# Patient Record
Sex: Male | Born: 1970 | Race: White | Hispanic: No | Marital: Married | Smoking: Former smoker
Health system: Southern US, Community
[De-identification: ages and names within clinical notes are randomized; demographics above are authoritative.]

## PROBLEM LIST (undated history)

## (undated) DIAGNOSIS — Q759 Congenital malformation of skull and face bones, unspecified: Secondary | ICD-10-CM

## (undated) DIAGNOSIS — F419 Anxiety disorder, unspecified: Secondary | ICD-10-CM

## (undated) DIAGNOSIS — M47816 Spondylosis without myelopathy or radiculopathy, lumbar region: Secondary | ICD-10-CM

## (undated) DIAGNOSIS — M545 Low back pain, unspecified: Secondary | ICD-10-CM

## (undated) DIAGNOSIS — G894 Chronic pain syndrome: Secondary | ICD-10-CM

## (undated) HISTORY — DX: Chronic pain syndrome: G89.4

## (undated) HISTORY — DX: Congenital malformation of skull and face bones, unspecified: Q75.9

## (undated) HISTORY — PX: HERNIA REPAIR: SHX51

## (undated) HISTORY — DX: Low back pain: M54.5

## (undated) HISTORY — PX: SPINE SURGERY: SHX786

## (undated) HISTORY — DX: Low back pain, unspecified: M54.50

## (undated) HISTORY — PX: OTHER SURGICAL HISTORY: SHX169

## (undated) HISTORY — DX: Spondylosis without myelopathy or radiculopathy, lumbar region: M47.816

## (undated) HISTORY — DX: Anxiety disorder, unspecified: F41.9

---

## 2002-10-25 ENCOUNTER — Ambulatory Visit (HOSPITAL_COMMUNITY): Admission: RE | Admit: 2002-10-25 | Discharge: 2002-10-25 | Payer: Self-pay | Admitting: Family Medicine

## 2002-10-25 ENCOUNTER — Encounter: Payer: Self-pay | Admitting: Family Medicine

## 2004-11-19 ENCOUNTER — Encounter
Admission: RE | Admit: 2004-11-19 | Discharge: 2005-02-17 | Payer: Self-pay | Admitting: Physical Medicine & Rehabilitation

## 2004-11-20 ENCOUNTER — Ambulatory Visit: Payer: Self-pay | Admitting: Physical Medicine & Rehabilitation

## 2005-02-12 ENCOUNTER — Ambulatory Visit: Payer: Self-pay | Admitting: Physical Medicine & Rehabilitation

## 2005-03-27 ENCOUNTER — Encounter
Admission: RE | Admit: 2005-03-27 | Discharge: 2005-06-25 | Payer: Self-pay | Admitting: Physical Medicine & Rehabilitation

## 2005-03-27 ENCOUNTER — Ambulatory Visit: Payer: Self-pay | Admitting: Physical Medicine & Rehabilitation

## 2005-05-16 ENCOUNTER — Ambulatory Visit: Payer: Self-pay | Admitting: Physical Medicine & Rehabilitation

## 2005-06-24 ENCOUNTER — Ambulatory Visit: Payer: Self-pay | Admitting: Physical Medicine & Rehabilitation

## 2005-07-22 ENCOUNTER — Encounter
Admission: RE | Admit: 2005-07-22 | Discharge: 2005-10-20 | Payer: Self-pay | Admitting: Physical Medicine & Rehabilitation

## 2005-08-21 ENCOUNTER — Ambulatory Visit: Payer: Self-pay | Admitting: Physical Medicine & Rehabilitation

## 2005-09-25 ENCOUNTER — Ambulatory Visit: Payer: Self-pay | Admitting: Physical Medicine & Rehabilitation

## 2005-10-21 ENCOUNTER — Encounter
Admission: RE | Admit: 2005-10-21 | Discharge: 2006-01-19 | Payer: Self-pay | Admitting: Physical Medicine & Rehabilitation

## 2005-10-21 ENCOUNTER — Ambulatory Visit: Payer: Self-pay | Admitting: Physical Medicine & Rehabilitation

## 2005-11-26 ENCOUNTER — Ambulatory Visit: Payer: Self-pay | Admitting: Physical Medicine & Rehabilitation

## 2006-01-20 ENCOUNTER — Encounter
Admission: RE | Admit: 2006-01-20 | Discharge: 2006-04-20 | Payer: Self-pay | Admitting: Physical Medicine & Rehabilitation

## 2006-01-20 ENCOUNTER — Ambulatory Visit: Payer: Self-pay | Admitting: Physical Medicine & Rehabilitation

## 2006-03-14 ENCOUNTER — Ambulatory Visit: Payer: Self-pay | Admitting: Physical Medicine & Rehabilitation

## 2006-04-15 ENCOUNTER — Ambulatory Visit: Payer: Self-pay | Admitting: Physical Medicine & Rehabilitation

## 2006-06-06 ENCOUNTER — Encounter
Admission: RE | Admit: 2006-06-06 | Discharge: 2006-09-04 | Payer: Self-pay | Admitting: Physical Medicine & Rehabilitation

## 2006-06-06 ENCOUNTER — Ambulatory Visit: Payer: Self-pay | Admitting: Physical Medicine & Rehabilitation

## 2006-06-11 ENCOUNTER — Ambulatory Visit (HOSPITAL_COMMUNITY): Admission: RE | Admit: 2006-06-11 | Discharge: 2006-06-11 | Payer: Self-pay | Admitting: Urology

## 2006-07-28 ENCOUNTER — Ambulatory Visit: Payer: Self-pay | Admitting: Physical Medicine & Rehabilitation

## 2006-09-18 ENCOUNTER — Encounter
Admission: RE | Admit: 2006-09-18 | Discharge: 2006-12-17 | Payer: Self-pay | Admitting: Physical Medicine & Rehabilitation

## 2006-09-18 ENCOUNTER — Ambulatory Visit: Payer: Self-pay | Admitting: Physical Medicine & Rehabilitation

## 2006-10-22 ENCOUNTER — Ambulatory Visit: Payer: Self-pay | Admitting: Physical Medicine & Rehabilitation

## 2006-10-22 ENCOUNTER — Encounter
Admission: RE | Admit: 2006-10-22 | Discharge: 2007-01-20 | Payer: Self-pay | Admitting: Physical Medicine & Rehabilitation

## 2006-11-25 ENCOUNTER — Ambulatory Visit: Payer: Self-pay | Admitting: Physical Medicine & Rehabilitation

## 2007-01-26 ENCOUNTER — Ambulatory Visit: Payer: Self-pay | Admitting: Physical Medicine & Rehabilitation

## 2007-01-26 ENCOUNTER — Encounter
Admission: RE | Admit: 2007-01-26 | Discharge: 2007-04-26 | Payer: Self-pay | Admitting: Physical Medicine & Rehabilitation

## 2007-04-28 ENCOUNTER — Ambulatory Visit: Payer: Self-pay | Admitting: Physical Medicine & Rehabilitation

## 2007-04-28 ENCOUNTER — Encounter
Admission: RE | Admit: 2007-04-28 | Discharge: 2007-07-27 | Payer: Self-pay | Admitting: Physical Medicine & Rehabilitation

## 2007-06-23 ENCOUNTER — Ambulatory Visit: Payer: Self-pay | Admitting: Physical Medicine & Rehabilitation

## 2007-08-13 ENCOUNTER — Ambulatory Visit: Payer: Self-pay | Admitting: Physical Medicine & Rehabilitation

## 2007-08-13 ENCOUNTER — Encounter
Admission: RE | Admit: 2007-08-13 | Discharge: 2007-08-19 | Payer: Self-pay | Admitting: Physical Medicine & Rehabilitation

## 2007-10-14 ENCOUNTER — Ambulatory Visit: Payer: Self-pay | Admitting: Physical Medicine & Rehabilitation

## 2007-10-14 ENCOUNTER — Encounter
Admission: RE | Admit: 2007-10-14 | Discharge: 2008-01-12 | Payer: Self-pay | Admitting: Physical Medicine & Rehabilitation

## 2007-12-10 ENCOUNTER — Ambulatory Visit: Payer: Self-pay | Admitting: Physical Medicine & Rehabilitation

## 2008-01-07 ENCOUNTER — Ambulatory Visit: Payer: Self-pay | Admitting: Physical Medicine & Rehabilitation

## 2008-02-10 ENCOUNTER — Encounter
Admission: RE | Admit: 2008-02-10 | Discharge: 2008-04-04 | Payer: Self-pay | Admitting: Physical Medicine & Rehabilitation

## 2008-02-12 ENCOUNTER — Ambulatory Visit: Payer: Self-pay | Admitting: Physical Medicine & Rehabilitation

## 2008-03-10 ENCOUNTER — Ambulatory Visit: Payer: Self-pay | Admitting: Physical Medicine & Rehabilitation

## 2008-04-04 ENCOUNTER — Ambulatory Visit: Payer: Self-pay | Admitting: Physical Medicine & Rehabilitation

## 2008-05-05 ENCOUNTER — Encounter
Admission: RE | Admit: 2008-05-05 | Discharge: 2008-06-28 | Payer: Self-pay | Admitting: Physical Medicine & Rehabilitation

## 2008-05-06 ENCOUNTER — Ambulatory Visit: Payer: Self-pay | Admitting: Physical Medicine & Rehabilitation

## 2008-05-31 ENCOUNTER — Ambulatory Visit: Payer: Self-pay | Admitting: Physical Medicine & Rehabilitation

## 2008-06-28 ENCOUNTER — Ambulatory Visit: Payer: Self-pay | Admitting: Physical Medicine & Rehabilitation

## 2008-07-22 ENCOUNTER — Encounter
Admission: RE | Admit: 2008-07-22 | Discharge: 2008-10-20 | Payer: Self-pay | Admitting: Physical Medicine & Rehabilitation

## 2008-08-31 ENCOUNTER — Ambulatory Visit: Payer: Self-pay | Admitting: Physical Medicine & Rehabilitation

## 2008-10-11 ENCOUNTER — Ambulatory Visit: Payer: Self-pay | Admitting: Physical Medicine & Rehabilitation

## 2008-11-01 ENCOUNTER — Encounter
Admission: RE | Admit: 2008-11-01 | Discharge: 2009-01-25 | Payer: Self-pay | Admitting: Physical Medicine & Rehabilitation

## 2008-11-09 ENCOUNTER — Ambulatory Visit: Payer: Self-pay | Admitting: Physical Medicine & Rehabilitation

## 2008-12-07 ENCOUNTER — Ambulatory Visit: Payer: Self-pay | Admitting: Physical Medicine & Rehabilitation

## 2009-01-25 ENCOUNTER — Encounter
Admission: RE | Admit: 2009-01-25 | Discharge: 2009-04-25 | Payer: Self-pay | Admitting: Physical Medicine & Rehabilitation

## 2009-02-01 ENCOUNTER — Ambulatory Visit: Payer: Self-pay | Admitting: Physical Medicine & Rehabilitation

## 2009-03-01 ENCOUNTER — Ambulatory Visit: Payer: Self-pay | Admitting: Physical Medicine & Rehabilitation

## 2009-03-24 ENCOUNTER — Ambulatory Visit: Payer: Self-pay | Admitting: Physical Medicine & Rehabilitation

## 2009-05-03 ENCOUNTER — Encounter
Admission: RE | Admit: 2009-05-03 | Discharge: 2009-08-01 | Payer: Self-pay | Admitting: Physical Medicine & Rehabilitation

## 2009-05-10 ENCOUNTER — Ambulatory Visit: Payer: Self-pay | Admitting: Physical Medicine & Rehabilitation

## 2009-06-14 ENCOUNTER — Ambulatory Visit: Payer: Self-pay | Admitting: Physical Medicine & Rehabilitation

## 2009-07-12 ENCOUNTER — Ambulatory Visit: Payer: Self-pay | Admitting: Physical Medicine & Rehabilitation

## 2009-08-03 ENCOUNTER — Encounter
Admission: RE | Admit: 2009-08-03 | Discharge: 2009-10-04 | Payer: Self-pay | Admitting: Physical Medicine & Rehabilitation

## 2009-08-09 ENCOUNTER — Ambulatory Visit: Payer: Self-pay | Admitting: Physical Medicine & Rehabilitation

## 2009-09-06 ENCOUNTER — Ambulatory Visit: Payer: Self-pay | Admitting: Physical Medicine & Rehabilitation

## 2009-10-04 ENCOUNTER — Ambulatory Visit: Payer: Self-pay | Admitting: Physical Medicine & Rehabilitation

## 2009-10-25 ENCOUNTER — Encounter
Admission: RE | Admit: 2009-10-25 | Discharge: 2010-01-23 | Payer: Self-pay | Admitting: Physical Medicine & Rehabilitation

## 2009-11-01 ENCOUNTER — Ambulatory Visit: Payer: Self-pay | Admitting: Physical Medicine & Rehabilitation

## 2009-12-06 ENCOUNTER — Ambulatory Visit: Payer: Self-pay | Admitting: Physical Medicine & Rehabilitation

## 2010-01-04 ENCOUNTER — Ambulatory Visit: Payer: Self-pay | Admitting: Physical Medicine & Rehabilitation

## 2010-01-25 ENCOUNTER — Encounter
Admission: RE | Admit: 2010-01-25 | Discharge: 2010-04-20 | Payer: Self-pay | Admitting: Physical Medicine & Rehabilitation

## 2010-02-01 ENCOUNTER — Ambulatory Visit: Payer: Self-pay | Admitting: Physical Medicine & Rehabilitation

## 2010-02-28 ENCOUNTER — Ambulatory Visit: Payer: Self-pay | Admitting: Physical Medicine & Rehabilitation

## 2010-03-28 ENCOUNTER — Ambulatory Visit: Payer: Self-pay | Admitting: Physical Medicine & Rehabilitation

## 2010-04-20 ENCOUNTER — Encounter
Admission: RE | Admit: 2010-04-20 | Discharge: 2010-07-19 | Payer: Self-pay | Source: Home / Self Care | Admitting: Physical Medicine & Rehabilitation

## 2010-04-26 ENCOUNTER — Ambulatory Visit: Payer: Self-pay | Admitting: Physical Medicine & Rehabilitation

## 2010-06-04 ENCOUNTER — Ambulatory Visit: Payer: Self-pay | Admitting: Physical Medicine & Rehabilitation

## 2010-07-26 ENCOUNTER — Encounter
Admission: RE | Admit: 2010-07-26 | Discharge: 2010-10-24 | Payer: Self-pay | Source: Home / Self Care | Attending: Physical Medicine & Rehabilitation | Admitting: Physical Medicine & Rehabilitation

## 2010-08-01 ENCOUNTER — Ambulatory Visit: Payer: Self-pay | Admitting: Physical Medicine & Rehabilitation

## 2010-09-19 ENCOUNTER — Ambulatory Visit: Payer: Self-pay | Admitting: Physical Medicine & Rehabilitation

## 2010-11-15 ENCOUNTER — Encounter: Payer: PRIVATE HEALTH INSURANCE | Attending: Physical Medicine & Rehabilitation

## 2010-11-15 ENCOUNTER — Ambulatory Visit: Payer: Medicare Other

## 2010-11-15 DIAGNOSIS — M545 Low back pain, unspecified: Secondary | ICD-10-CM | POA: Insufficient documentation

## 2010-11-15 DIAGNOSIS — Q759 Congenital malformation of skull and face bones, unspecified: Secondary | ICD-10-CM

## 2010-11-15 DIAGNOSIS — M4120 Other idiopathic scoliosis, site unspecified: Secondary | ICD-10-CM | POA: Insufficient documentation

## 2010-11-15 DIAGNOSIS — G8929 Other chronic pain: Secondary | ICD-10-CM | POA: Insufficient documentation

## 2010-11-15 DIAGNOSIS — M5380 Other specified dorsopathies, site unspecified: Secondary | ICD-10-CM

## 2011-01-09 ENCOUNTER — Encounter
Payer: PRIVATE HEALTH INSURANCE | Attending: Physical Medicine & Rehabilitation | Admitting: Physical Medicine & Rehabilitation

## 2011-01-09 DIAGNOSIS — M545 Low back pain, unspecified: Secondary | ICD-10-CM | POA: Insufficient documentation

## 2011-01-09 DIAGNOSIS — Q759 Congenital malformation of skull and face bones, unspecified: Secondary | ICD-10-CM

## 2011-01-09 DIAGNOSIS — M4120 Other idiopathic scoliosis, site unspecified: Secondary | ICD-10-CM

## 2011-01-09 DIAGNOSIS — M4150 Other secondary scoliosis, site unspecified: Secondary | ICD-10-CM | POA: Insufficient documentation

## 2011-01-09 DIAGNOSIS — M549 Dorsalgia, unspecified: Secondary | ICD-10-CM | POA: Insufficient documentation

## 2011-01-09 DIAGNOSIS — Q188 Other specified congenital malformations of face and neck: Secondary | ICD-10-CM | POA: Insufficient documentation

## 2011-01-09 DIAGNOSIS — M5380 Other specified dorsopathies, site unspecified: Secondary | ICD-10-CM

## 2011-02-05 NOTE — Assessment & Plan Note (Signed)
Kenneth Farrell is back regarding his chronic back and trunk pain.  He has been doing very well since I last saw him and reports no changes.  He works in his yard still and stays is active.  Pain ranges from 4-5/10, described as sharp, stabbing, and constant.  Sleep is fair.  Mood has been good.  REVIEW OF SYSTEMS:  Notable for spasms, occasional anxiety.  A full 12- point review is in the written health history section of the chart.  SOCIAL HISTORY:  The patient lives alone and is quite independent.  PHYSICAL EXAMINATION:  VITAL SIGNS:  Blood pressure is 138/84, pulse is 86, respiratory rate 18, and he is saturating 96% on room air. GENERAL:  The patient is pleasant, alert, and oriented x3.  Weight is stable.  He is alert and appropriate. MUSCULOSKELETAL:  He has tenderness over the left sacral region as well as some pain in his right flank and in the right shoulder blade.  He has hypoplastic regions over the face and upper extremity which are constant. HEART:  Regular. CHEST:  Clear. ABDOMEN:  Soft and nontender.  ASSESSMENT: 1. 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 2. Associated low back pain.  PLAN: 1. The patient is doing well, again focused on continuing with home     exercise program, pacing with activities, and reasonable activities     in general. 2. I refilled MS Contin 100 mg q.12 h with a second prescription for     next month. 3. He will see nurse practitioner in 2 months and I will see him in 4     months.     Ranelle Oyster, M.D. Electronically Signed    ZTS/MedQ D:  01/09/2011 12:30:35  T:  01/10/2011 02:04:01  Job #:  [id number]

## 2011-02-19 NOTE — Assessment & Plan Note (Signed)
Kenneth Farrell is back regarding his chronic right-sided shoulder back and leg  pain.  Pain has been generally stable at 3-4/10.  He stopped smoking in  February and is doing fairly well with that.  He has had a little bit  more pain recently as he has been doing a lot of painting at home.  Pain  is sharp, stabbing, constant, and aching.  Pain interferes with general  activity, in relations with others, enjoyment of life on a moderate  level.  Sleep is fair.   REVIEW OF SYSTEMS:  Notable for spasms, anxiety.  Other pertinent  positives are above and full review is in the written health and history  section of the chart.   SOCIAL HISTORY:  Unchanged.   PHYSICAL EXAMINATION:  VITAL SIGNS:  Blood pressure is 119/70, pulse 87,  respiratory rate 80, he is sating 98% on room air.  GENERAL:  The patient is pleasant, alert, and oriented x3.  Affect is  appropriate.  Cognition is within normal limits.  He has a bit of  dysphonic voice.  He attributes this to sinus congestion.  BACK:  Remains hypoplastic right arm and face today.  Right eye a bit  irritated and swollen in general.  CHEST:  Clear.  ABDOMEN:  Soft, nontender.   ASSESSMENT:  1. 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  2. Chronic low back pain.   PLAN:  1. Continue with morphine extended release 90 mg p.o. b.i.d.  2. Maintain Xanax, Ambien, Voltaren, Lidoderm patches.  He is doing      well with these.  I encouraged ongoing activity to tolerance.  I am      happy with his smoking cessation.  3. He will see his primary physician Dr. Felicity Coyer in August to assume      general care.       Ranelle Oyster, M.D.  Electronically Signed     ZTS/MedQ  D:  03/01/2009 14:10:10  T:  03/02/2009 04:59:03  Job #:  [id number]

## 2011-02-19 NOTE — Assessment & Plan Note (Signed)
Kenneth Farrell is back regarding his chronic back and leg pain.  Pain has been  generally consistent at 4/10 level.  The cold weather tends to  exacerbate symptoms a bit, although, he has been managing.  He recently  quit smoking now for about 25 days after coming down with an upper  respiratory tract infection.  He is very proud of that.  He describes  pain as sharp, stabbing, dull, constant aching.  Pain interferes with  his general activity, relations with others, enjoyment of life on a  moderate level.  Pain worsens while walking, bending, sitting, standing.  He does stay quite active at home and is self-sufficient.   REVIEW OF SYSTEMS:  Notable for spasms, occasional anxiety.  Other  pertinent positives are above and full review is in the written health  and history section of the chart.   The patient's Oswestry score is 46% today.   SOCIAL HISTORY:  Unchanged.   PHYSICAL EXAMINATION:  Blood pressure is 124/69, pulse is 92,  respiratory rate 80.  He is sating 96% on room air.  The patient is  pleasant, alert and oriented x3.  Affect is bright, appropriate.  Lungs  appear clear.  Heart is regular.  He continues to have poor lumbar  flexion particularly rightward flexion and bending.  He has right-sided  hypoplasia in the trunk and on the extremities and face.  Cognition is  appropriate.  Cranial nerve exam is generally intact.  Chest is clear.  Abdomen is soft, nontender.   ASSESSMENT:  1. Kenneth Farrell with secondary scoliosis and right facial      hypoplasia.  2. Chronic low back pain related to posture and likely felt lumbar      facet disease.  The patient is fairly well compensated at this      point.   PLAN:  1. We will continue with morphine extended release 330 mg 3 tablets      p.o. b.i.d.  2. Continue with Xanax, Ambien, and Voltaren with Lidoderm patch use      p.r.n.  3. The patient would like to find a primary physician here in the      [place] area, as he is  not happy with his physician in      [place].  We will ask [facility] if they are willing to      assume primary care of this pleasant man.  I will see Kenneth Farrell back      in 3 months with 35-month followup in the nurse clinic.      Kenneth Farrell, M.D.  Electronically Signed     ZTS/MedQ  D:  12/07/2008 11:35:09  T:  12/08/2008 01:00:52  Job #:  [id number]   cc:   [employer]

## 2011-02-19 NOTE — Assessment & Plan Note (Signed)
Kenneth Farrell is back regarding his right-sided pain.  He had been doing fairly  well with his current medications.  He does have a bit of back tightness  and hip tightness when she stands for a longer periods of time.  He does  try to change his standing position of posture.  He does not do a lot of  stretching, however, when he is upstanding.  He likes the Lidoderm patch  as well as the Flector patches, although the Flector patch sometimes  will come lose particularly when he gets sweaty or if they become wet.  He rates the pain as 3/10, described as a sharp, stabbing, constant, and  aching.  Pain interferes with general activity, relations with others,  and enjoyment of life on a mild level.  Sleep is fair.   REVIEW OF SYSTEMS:  Notable for occasional spasms and anxiety.  Other  pertinent positives are above and full 14-point review is in the written  health and history section.   SOCIAL HISTORY:  The patient lives alone and is independent.   PHYSICAL EXAMINATION:  VITAL SIGNS:  Blood pressure is 151/86, pulse is  90, respiratory rate 20, and saturating 100% on room air.  GENERAL:  The patient is pleasant, alert, and oriented x3.  He walks  with some antalgia favoring the right side, but generally stable.  He  had fair lumbar flexion capability today with 16-17 degrees of bending  noted.  He had 15-20 degrees of extension with some pain there.  He  continues to have some pain along the right scapula and thoracic area.  HEART:  Regular rate.  CHEST:  Clear.  ABDOMEN:  Soft and nontender.   ASSESSMENT:  1. 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  2. Chronic low back pain related to posture and likely lumbar facet      disease.   PLAN:  1. Continue morphine controlled release 90 b.i.d.  2. Xanax and Ambien.  We will continue for mood and sleep.  3. Try to change out the Flector patches with Voltaren gel.  This may      be easier for him to apply.   He may be able to use it over more      areas as well.  4. Continue Lidoderm patches.  5. I will see him back in about 3 months' time with nurse clinic      followup in 1 month.      Ranelle Oyster, M.D.  Electronically Signed     ZTS/MedQ  D:  05/06/2008 11:36:19  T:  05/07/2008 03:54:23  Job #:  [id number]

## 2011-02-19 NOTE — Assessment & Plan Note (Signed)
Kenneth Farrell is back regarding his chronic neck and back pain.  He has been doing  fairly well.  He reports no increase over the last few months.  He rates  his pain today at 3/10, describes it as sharp, dull, stabbing, and  constant.  The pain usually worsens with walking, bending, and general  activities.  He is sleeping well.  Pain interferes with his general  activity, relations with others, and enjoyment of life on a mild to  moderate level.  He remains on morphine sulfate controlled release 30 mg  3 tablets twice a day.  He uses Xanax for anxiety.   REVIEW OF SYSTEMS:  Notable for occasional spasms.  Anxiety has been  better.  Full review is in the health and history section.  Other  pertinent positives are above.   SOCIAL HISTORY:  Without change.   PHYSICAL EXAM:  Blood pressure is 147/93, pulse 86, respiratory rate 18.  He is satting 98% on room air.  The patient is pleasant, alert and oriented x3.  Affect is bright and  appropriate.  Pain is generally stable in the low back.  He has ongoing  postural abnormalities related to his syndrome.  Motor function is  stable.  HEART:  Regular rate.  CHEST:  Clear.  ABDOMEN:  Soft and nontender.  Cognitively, he is appropriate.   ASSESSMENT:  1. 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  2. Chronic low back pain related to posture and lumbar facet disease.   PLAN:  1. The patient is doing well at this point.  Refilled morphine      controlled release 90 mg twice a day.  2. Xanax 1 mg q.i.d. for anxiety.  3. I will see the patient back in 4 months.  He will see the nurse      clinic back in 2 months.  An extra month of morphine was given      today.      Ranelle Oyster, M.D.  Electronically Signed     ZTS/MedQ  D:  06/24/2007 12:38:15  T:  06/24/2007 15:53:16  Job #:  [id number]

## 2011-02-19 NOTE — Assessment & Plan Note (Signed)
Kenneth Farrell is back regarding his back pain.  He has been doing generally  well.  He was sick recently and missed his last appointment, but he has  been better since then.  He is doing well with continued morphine  dosing.  He likes the Voltaren gel and is having better results with his  local pain relief.  He rates his pain a 2-4/10; described it as sharp,  stabbing, constant, and aching.  Pain interferes with general activity,  in relations with others, and enjoyment of life on a moderate level.  Sleep is fair.   REVIEW OF SYSTEMS:  Notable for spasms and anxiety.  Other pertinent  positives are above, and full review is in the written health and  history section of the chart.   PHYSICAL EXAMINATION:  VITAL SIGNS:  Blood pressure is 119/67, pulse 89,  respiratory rate 18, and he is sating 94% on room air.  GENERAL:  The patient is pleasant, alert, and oriented x3.  Affect is  bright and appropriate.  EXTREMITIES:  He continues to have limited lumbar flexion, particularly  on the right side, with right-sided truncal hypoplasia.  Shoulder range  of motion is somewhat reduced.  HEART:  Regular.  CHEST:  Clear.  ABDOMEN:  Soft and nontender.   SOCIAL HISTORY:  The patient still lives alone, he is otherwise  independent.   ASSESSMENT:  1. 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  2. Chronic low back pain related to posture and likely lumbar facet      disease.   PLAN:  1. Continue with morphine 290 mg extended release b.i.d.  2. Maintain Xanax, Ambien, and Voltaren at current doses.  He can use      his Lidoderm patches as well.  3. We will see him back in 3 months per routine and 1 month with      nursing.  We discussed bowel regimen as well today.      Ranelle Oyster, M.D.  Electronically Signed     ZTS/MedQ  D:  08/31/2008 13:47:41  T:  08/31/2008 22:58:31  Job #:  [id number]

## 2011-02-19 NOTE — Assessment & Plan Note (Signed)
HISTORY OF PRESENT ILLNESS:  [name] is back regarding his back pain. He has  been doing fairly well over the last few months. His pain is only a 3  out of 10. He is fairly well controlled with controlled release  morphine. He stays active and runs his own household. The pain can be  sharp, dull, stabbing, and constant at times. The pain interferes with  general activity, relations with others, and enjoyment of life on a mild  level. He is currently using morphine controlled release 30 mg 3 tablets  twice a day. He is on Xanax for anxiety.   REVIEW OF SYSTEMS:  Notable for the above. Full review is in the written  health and history section of the chart.   SOCIAL HISTORY:  The patient is living alone without change.   PHYSICAL EXAMINATION:  VITAL SIGNS:  Blood pressure 126/71, pulse 111,  respiratory rate 18, sating 99% on room air.  GENERAL:  The patient is pleasant. Alert and oriented times three.  NEUROLOGIC:  Affect is generally bright and appropriate. His pain is  consistent over the right side and shoulder. Posture abnormalities are  still noted related to his syndrome. Motor function is stable.  Cognitively he is appropriate with normal cranial nerve examination.  HEART:  Regular.  CHEST:  Clear.  ABDOMEN:  Soft, nontender.   ASSESSMENT:  1. 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  2. Chronic low back pain related to posture and lumbar facet disease.   PLAN:  1. Continue with current medication regimen using controlled release      morphine 90 mg b.i.d.  2. Xanax for anxiety.  3. Continue with followup.      Ranelle Oyster, M.D.  Electronically Signed     ZTS/MedQ  D:  11/13/2007 16:46:11  T:  11/15/2007 16:28:15  Job #:  [id number]

## 2011-02-19 NOTE — Assessment & Plan Note (Signed)
Kenneth Farrell is back regarding his back.  He has generally been stable since I  last saw him.  Pain is a 3 to 4 out of 10.  He finds that he has to pace  himself a bit more, but, in general, is doing okay.  He has trouble  mowing his grass, although he states he has some altered, bumpy terrain  that makes it sometimes difficult to push the mower.  He has continued  on his morphine for baseline pain control and has done fairly well with  that.  He uses Lidoderm patches as well.  Pain interferes with general  activity, relations with others, and enjoyment of life on a mild-to-  moderate level.   REVIEW OF SYSTEMS:  Review of systems notable for some anxiety,  otherwise, he is stable.  Full review is in the written health and  history section of the chart.   SOCIAL HISTORY:  The patient reports no specific changes today.   Blood pressure is 138/85, pulse 78, respiratory rate 20, satting 99% on  room air.  The patient is pleasant, alert and oriented x3.  Affect is bright and  appropriate.  Cognition is within normal limits.  He has ongoing postural abnormalities on the right, but is stable there.  Range of motion is a bit inhibited, still, to thoracic low back.  HEART:  Regular rate.  CHEST:  Clear.  ABDOMEN:  Soft, nontender.   ASSESSMENT:  1. Kenneth Farrell syndrome secondary to scoliosis and right facial      hypoplasia.  2. Chronic low back pain related to postural lumbar facet disease.   PLAN:  1. Continue with morphine controlled release 90 mg b.i.d.  2. Continue Xanax and Ambien for mood and sleep.  3. Continue activities as appropriate.  We discussed particular pacing      techniques and some other modifications he can make at home today.  4. I will see him back in 3 months.      Ranelle Oyster, M.D.  Electronically Signed     ZTS/MedQ  D:  02/12/2008 11:22:00  T:  02/12/2008 12:12:49  Job #:  [id number]

## 2011-02-22 NOTE — Group Therapy Note (Signed)
MEDICAL RECORD NUMBER:  [id number]   DATE:  November 20, 2004   REFERRING PHYSICIAN:  Melvyn Novas, M.D.   CHIEF COMPLAINT:  Low back and neck pain.   HISTORY OF PRESENT ILLNESS:  This is a pleasant 40 year old white male with  a history of developmental disorder apparently called Goldenhar Syndrome.  Essentially, he was left with hypoplasia of the right side of his face and  right-sided scoliosis of the spine.  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  Mother tells me there is a 90% chance of cognitive deficits with this  disorder, and the patient seems to be spared of these.  The patient has had  multiple grafts and preservative surgeries over the years for his lumbar and  thoracic spine.  In 1990, ultimately he had a Harrington rod placed, and  then another surgery was done to repair cracked rod in 1995.  The patient  has had multiple grafts from his ribs and pelvis as well as from the bone  graft banks.  He was followed by the pain clinic at [facility] until a year  ago when it closed down.  He has been seen at the family practice clinic for  the last several months for his pain control.   The patient is currently on MS Contin 30 mg 3 tablets b.i.d., Nortriptyline  50 mg 2 tablets at bedtime, Ambien 10 mg q.h.s.  He is very happy with his  pain control.  It has allowed him to be active.  He exercises.  He goes to  the gym and uses the equipment regularly.  He plays with his nephew, I  believe, who is 68 years old.  He states his pain ranges from 1 to 3/10 and  usually sits about 2/10.  He does note some muscle spasm, in particular pain  at the right hip near the iliac crest donor site.  The pain improves with  rest, medication, exercises and made worse with walking, bending, and  working.  The patient has not had any therapy recently.  He has been on a  home exercise program.  The last time he has had any neurological imaging  done was in 2001 by Dr. Danielle Dess.   PAST  MEDICAL HISTORY:  1.  Hypertension.  2.  Arthritis.  3.  History of as mentioned above.   CURRENT MEDICATIONS:  1.  Over-the-counter acid blocker.  2.  Diovan/HCTZ 160/12.5.  3.  Stool softener over-the-counter.  4.  Ambien 10 mg q.h.s.  5.  Xanax 1 mg four times a day  6.  Nortriptyline 100 mg q.h.s.  7.  MS Contin 90 mg b.i.d.   ALLERGIES:  None.   SOCIAL HISTORY:  The patient lives with his mother.  He smoked one-half pack  per day for 15 years.  He last worked in 1993.  He is on disability  currently.   FAMILY HISTORY:  Significant for heart disease, high blood pressure, cancer.   REVIEW OF SYSTEMS:  The patient reports some reflux, anxiety, occasional  problems with sleep and hearing through the right ear.  Other pertinent  positives are as mentioned above.  His remaining Review of Systems items  were negative.   PHYSICAL EXAMINATION:  VITAL SIGNS:  Blood pressure 129/86, pulse 106,  respiratory rate 16, O2 saturation 95% on room air.  GENERAL:  The patient walks with a slight shuffle towards the right side but  stable.  Affect is bright and appropriate.  Appearance  is well kept.  Cognitively, the patient was appropriate today.  Cranial nerve exam revealed  some delayed closure and difficulty closing the right eyelid.  He had some  decreased sensation on the right side of the face and positive right-sided  facial droop. Ear was hypoplastic, and hearing was decreased on the right  side.  The patient had some atrophy of his right shoulder musculature as  well.  Arm strength was excellent with good range of motion as was the left  arm.  Reflexes were 2+ on both sides.  Sensory exam in both arms was intact.  Bilateral muscle and sensory exam was normal.  The patient had normal tone  and reflexes in both legs.  HEART:  Regular rhythm.  LUNGS:  Clear.  ABDOMEN:  Soft and nontender.  EXTREMITIES:  No clubbing, cyanosis, or edema.  BACK:  The patient had no significant  deformities of the rib cage and back.  The back appeared to be fairly straight with surgical scarring seen from the  low cervical region to the low lumbar area.  The rib cage seemed to be  rotated counterclockwise on the left and deviated posteriorly.  The patient  had scapulas essentially in place but essentially deviated a bit superiorly  and anteriorly.  He had fair scapular rotation with abduction and rotation  of the shoulders.  The patient had a donor spot at the right iliac crest  that was very tender with palpation today.  The patient was able to bend for  me at the waist and was able to bend approximately 80 degrees with some  discomfort. Extension was about 20 degrees, and rotation was a little bit  limited at 10 to 15 degrees.  Lateral bending was the same.  Straight leg  testing was equivocal to negative.  Patrick's test was negative.   ASSESSMENT:  Goldenhar Syndrome with secondary scoliosis and right facial  hypoplasia.   PLAN:  1.  The patient is fairly well compensated with his current pain medication      regimen.  I am not inclined to make drastic changes today.  Will      continue morphine controlled release at 30 mg 3 tablets twice a day.      More than happy to continue Nortriptyline 100 mg q.h.s.  2.  I did recommend a trial of Lidoderm patches to be placed over      particularly tender spots; i.e., right iliac crest and along the spine.      He may place these on for 12 hours and then remove.  He also may benefit      from periodic Flexeril for spasm.  3.  Encouraged activity.  4.  Encouraged smoking cessation.  5.  I will see the patient back in three month's time.  Can see him back      sooner if needed.  I did refill Xanax 1 mg four times a day today as      well as MS Contin 30 mg b.i.d., #90, and gave him additional Lidoderm      patch refills.      ZTS/MedQ  D:  11/20/2004 16:51:01  T:  11/20/2004 20:07:52  Job #:  [id number]  cc:   Melvyn Novas, MD  [address] [place]  [place] [address]  Fax: [phone]

## 2011-03-06 ENCOUNTER — Encounter: Payer: PRIVATE HEALTH INSURANCE | Attending: Physical Medicine & Rehabilitation | Admitting: Neurosurgery

## 2011-03-06 DIAGNOSIS — M549 Dorsalgia, unspecified: Secondary | ICD-10-CM | POA: Insufficient documentation

## 2011-03-06 DIAGNOSIS — Q188 Other specified congenital malformations of face and neck: Secondary | ICD-10-CM | POA: Insufficient documentation

## 2011-03-06 DIAGNOSIS — Q759 Congenital malformation of skull and face bones, unspecified: Secondary | ICD-10-CM | POA: Insufficient documentation

## 2011-03-06 DIAGNOSIS — M4150 Other secondary scoliosis, site unspecified: Secondary | ICD-10-CM | POA: Insufficient documentation

## 2011-03-06 DIAGNOSIS — G894 Chronic pain syndrome: Secondary | ICD-10-CM

## 2011-03-06 DIAGNOSIS — M545 Low back pain, unspecified: Secondary | ICD-10-CM | POA: Insufficient documentation

## 2011-03-06 DIAGNOSIS — M961 Postlaminectomy syndrome, not elsewhere classified: Secondary | ICD-10-CM

## 2011-03-07 NOTE — Assessment & Plan Note (Signed)
ACCOUNT:  [id number].  HISTORY:  [name] is back today with his chronic back pain.  He is a patient of Dr. Riley Kill who has been following here for several years for chronic pain after several surgeries on his back for scoliosis correction.  Pain right now he rates at about 6.  Activity level is 5-7.  The pain is same 24 hours a day.  Sleep patterns are fair.  All activities aggravate his pain.  Rest therapy, the patient exercises, medications tend to help.  Mobility, walks without assistance.  He does have a severe curvature to his back with a slight limp today.  He climbs steps.  He drives.  He is on disability.  REVIEW OF SYSTEMS:  Notable for those difficulties as well as some spasms and anxiety from time to time.  No signs of aberrant behavior.  SOCIAL HISTORY:  Unchanged.  PAST MEDICAL HISTORY:  Unchanged.  Significant for several spine surgeries and other surgeries throughout his life.  PHYSICAL EXAMINATION:  VITAL SIGNS:  Blood pressure 121/81, his pulse 84, respirations 24, O2 sat 98 on room air. GENERAL:  He is alert, pleasant oriented x3. CONSTITUTIONAL:  He is within normal limits.  He still just reports pain across his back and into both hips from time to time and his shoulders that is unchanged from previous. HEART:  Regular rate and rhythm. CHEST:  Clear to auscultation.  ASSESSMENT: 1. History of scoliosis with right facial hypoplasia. 2. Chronic low back pain.  PLAN: 1. We will go ahead and refill his MS Contin mg q.12 h., 60 with no     refill.  He will follow up with Dr. Riley Kill in 2 months.  He just     had his medicine refilled. 2. He will continue his home exercise program.  His questions were     encouraged and answered.     Osmel Dykstra L. Blima Dessert Electronically Signed    RLW/MedQ D:  03/06/2011 13:10:48  T:  03/07/2011 03:07:52  Job #:  [id number]

## 2011-04-24 ENCOUNTER — Encounter
Payer: PRIVATE HEALTH INSURANCE | Attending: Physical Medicine & Rehabilitation | Admitting: Physical Medicine & Rehabilitation

## 2011-04-24 DIAGNOSIS — Q759 Congenital malformation of skull and face bones, unspecified: Secondary | ICD-10-CM

## 2011-04-24 DIAGNOSIS — M4120 Other idiopathic scoliosis, site unspecified: Secondary | ICD-10-CM

## 2011-04-24 DIAGNOSIS — M545 Low back pain, unspecified: Secondary | ICD-10-CM | POA: Insufficient documentation

## 2011-04-24 DIAGNOSIS — F411 Generalized anxiety disorder: Secondary | ICD-10-CM | POA: Insufficient documentation

## 2011-04-24 DIAGNOSIS — M5380 Other specified dorsopathies, site unspecified: Secondary | ICD-10-CM

## 2011-04-24 DIAGNOSIS — M62838 Other muscle spasm: Secondary | ICD-10-CM | POA: Insufficient documentation

## 2011-04-24 DIAGNOSIS — M4180 Other forms of scoliosis, site unspecified: Secondary | ICD-10-CM | POA: Insufficient documentation

## 2011-04-24 NOTE — Assessment & Plan Note (Signed)
HISTORY:  Kenneth Farrell is back regarding his chronic back pain.  He has been generally stable from a pain standpoint over last few months.  He notes that he has had worsening posture that had been noted by others with his head more forward as well as his back forward.  Pain remains 6/10.  He described as stabbing, aching, burning.  Pain interferes with general activity, relations with others, enjoyment of life on a moderate level. Sleep is fair.  REVIEW OF SYSTEMS:  Notable for occasional spasms, anxiety.  Full 12- point review is in the written health and history section of the chart.  SOCIAL HISTORY:  Unchanged.  PHYSICAL EXAMINATION:  VITAL SIGNS:  Blood pressure 140/78, pulse 91, respiratory rate 18, and he is satting 96% on room air. GENERAL:  The patient is pleasant and alert.  He stood up for midday and has a shoulder forward and particularly head forward position noted today.  His thoracic spine is fairly vertical.  Strength is generally 5/5.  He does have the left-to-right dissymmetries.  Strength is stable at 4-5/5 throughout.  Normal sensory function. HEART:  Regular. CHEST:  Clear. ABDOMEN:  Soft and nontender.  ASSESSMENT: 1. 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 2. Chronic low back pain.  PLAN: 1. I refilled his MS Contin 100 mg q.12 h. #60 with a second refill     for next month.  I gave the patient Pilates exercises and we worked     on better posture positioning in the room today.  We discussed some     ways he can stretch at home.  I also accessed the [employer]. 2. We will see him back here in about 2 months' time.     Ranelle Oyster, M.D. Electronically Signed    ZTS/MedQ D:  04/24/2011 12:39:16  T:  04/24/2011 13:22:04  Job #:  [id number]

## 2011-05-01 ENCOUNTER — Ambulatory Visit: Payer: Medicare Other | Admitting: Physical Medicine & Rehabilitation

## 2011-06-19 ENCOUNTER — Encounter: Payer: PRIVATE HEALTH INSURANCE | Attending: Neurosurgery | Admitting: Neurosurgery

## 2011-06-19 DIAGNOSIS — M545 Low back pain, unspecified: Secondary | ICD-10-CM | POA: Insufficient documentation

## 2011-06-19 DIAGNOSIS — M4120 Other idiopathic scoliosis, site unspecified: Secondary | ICD-10-CM | POA: Insufficient documentation

## 2011-06-19 DIAGNOSIS — G8929 Other chronic pain: Secondary | ICD-10-CM | POA: Insufficient documentation

## 2011-06-19 DIAGNOSIS — Q188 Other specified congenital malformations of face and neck: Secondary | ICD-10-CM | POA: Insufficient documentation

## 2011-06-19 DIAGNOSIS — G894 Chronic pain syndrome: Secondary | ICD-10-CM

## 2011-06-19 NOTE — Assessment & Plan Note (Signed)
ACCOUNT:  [id number].  This patient of Dr. Riley Kill seen for chronic back pain.  He reports no change in his pain.  He rates it about a 5 or 6.  It is a sharp stabbing, tingling, aching pain that he is listed as intermittent and constant.  General activity level is 5.  Pain is same 24 hours day. Sleep patterns are fair.  All activities aggravate.  Rest therapy, pacing, medication tend to help.  He walks without assistance.  He does climb steps and driving.  Walk about 15 minutes at a time.  He is on disability.  REVIEW OF SYSTEMS:  Notable for those difficulties described above as well as some spasms, anxiety.  His Oswestry score is 50.  No suicidal thoughts or aberrant behaviors.  PAST MEDICAL HISTORY, SOCIAL HISTORY AND FAMILY HISTORY:  Unchanged.  PHYSICAL EXAMINATION:  His blood pressure is 125/80, his pulse 111, respirations 16, O2 sats 99 on room air.  His motor strength is good in his upper and lower extremities.  Sensation is intact. Constitutionally, he is alert and oriented.  His gait is fairly normal.  ASSESSMENT: 1. History of scoliosis with right facial hypoplasia. 2. Chronic low back pain.  PLAN: 1. Refill Xanax 1 mg one p.o. q.i.d. 120 with three refills. 2. MS Contin 100 mg one p.o. q.12 h 60 with no refill. 3. Ambien 10 mg one p.o. nightly 30 with three refills. 4. He will follow up in our clinic in about 2 months.  His questions     were encouraged and answered.     Clarence Dunsmore L. Blima Dessert Electronically Signed    RLW/MedQ D:  06/19/2011 13:46:55  T:  06/19/2011 16:08:01  Job #:  [id number]

## 2011-08-21 ENCOUNTER — Encounter: Payer: PRIVATE HEALTH INSURANCE | Attending: Neurosurgery | Admitting: Neurosurgery

## 2011-08-21 DIAGNOSIS — M545 Low back pain, unspecified: Secondary | ICD-10-CM | POA: Insufficient documentation

## 2011-08-21 DIAGNOSIS — Q188 Other specified congenital malformations of face and neck: Secondary | ICD-10-CM | POA: Insufficient documentation

## 2011-08-21 DIAGNOSIS — M79609 Pain in unspecified limb: Secondary | ICD-10-CM | POA: Insufficient documentation

## 2011-08-21 DIAGNOSIS — G894 Chronic pain syndrome: Secondary | ICD-10-CM

## 2011-08-21 DIAGNOSIS — M4120 Other idiopathic scoliosis, site unspecified: Secondary | ICD-10-CM | POA: Insufficient documentation

## 2011-08-21 DIAGNOSIS — G8929 Other chronic pain: Secondary | ICD-10-CM | POA: Insufficient documentation

## 2011-08-22 NOTE — Assessment & Plan Note (Signed)
HISTORY:  This is a patient of Dr. Riley Kill, seen for chronic pain in his back and legs.  He reports no change in his pain at a 4-5.  It is sharp to dull, tingling stinging, and aching pain.  General activity level is 5.  Pain is same 24 hours a day.  Sleep patterns are fair.  All activities aggravate.  Rest, therapy, pacing, medication tend to help. He walks without assistance.  He climb steps and drive and walk about 15 minutes at a time.  Functionally, he is on disability.  REVIEW OF SYSTEMS:  Notable for difficulties described above as well as some spasm, anxiety.  No suicidal thoughts or aberrant behaviors.  Last UDS consistent.  PAST MEDICAL HISTORY, SOCIAL HISTORY, AND FAMILY HISTORY:  Unchanged.  PHYSICAL EXAMINATION:  VITAL SIGNS:  Blood pressure is 135/77, pulse 107, respirations 16, and O2 sats 97 on room air. GENERAL:  His motor strength and sensation are intact. CONSTITUTIONAL:  He is within normal limits.  He does have somewhat of a kyphotic gait.  He is alert and oriented x3.  IMPRESSION: 1. History of scoliosis.  He also has a right facial hypoplasia 2. Chronic low back pain.  PLAN:  He was given prescriptions for 2 months with MS Contin 100 mg 1 p.o. q.12 hours 60 with no refills.  Lidoderm 5% up to 3 a day, 90 with 5 refills.  His questions were encouraged and answered.  We will see him back in 2 months.     Valinda Fedie L. Blima Dessert     Ranelle Oyster, M.D. Electronically Signed   RLW/MedQ D:  08/22/2011 08:46:38  T:  08/22/2011 11:10:56  Job #:  [id number]

## 2011-10-16 ENCOUNTER — Encounter: Payer: PRIVATE HEALTH INSURANCE | Attending: Neurosurgery | Admitting: Neurosurgery

## 2011-10-16 DIAGNOSIS — M79609 Pain in unspecified limb: Secondary | ICD-10-CM | POA: Insufficient documentation

## 2011-10-16 DIAGNOSIS — M545 Low back pain, unspecified: Secondary | ICD-10-CM | POA: Insufficient documentation

## 2011-10-16 DIAGNOSIS — G8929 Other chronic pain: Secondary | ICD-10-CM | POA: Insufficient documentation

## 2011-10-16 DIAGNOSIS — M4120 Other idiopathic scoliosis, site unspecified: Secondary | ICD-10-CM | POA: Insufficient documentation

## 2011-10-16 DIAGNOSIS — G894 Chronic pain syndrome: Secondary | ICD-10-CM

## 2011-10-16 DIAGNOSIS — M62838 Other muscle spasm: Secondary | ICD-10-CM | POA: Insufficient documentation

## 2011-10-17 NOTE — Assessment & Plan Note (Signed)
This is a patient of Dr. Riley Kill, seen for chronic leg and back pain.  He reports no change in his pain, it is 5 or 6.  It is a sharp, dull, stabbing, tingling and aching pain.  General activity level is 5.  Pain is same 24 hours a day.  All activities aggravate.  Rest, therapy, pacing, medication helps.  He walks without assistance.  He can walk up to 15 minutes at a time.  He climb steps and drive.  Functionally, he is on disability.  REVIEW OF SYSTEMS:  Notable for the difficulties described above as well as some spasms, anxiety.  No aberrant behaviors.  Pill count and UDS consistent.  Collected UDS today.  PAST MEDICAL HISTORY, SOCIAL HISTORY, AND FAMILY MEDICAL:  Unchanged.  PHYSICAL EXAMINATION:  His blood pressure is 144/82, pulse 92, respirations 16, O2 sats 100 on room air.  His motor strength and sensation are intact.  Constitutionally, he is within normal limits.  He is alert and oriented x3.  He has slight limp.  IMPRESSION: 1. History of scoliosis and low back pain. 2. Chronic pain.  PLAN: 1. He will be given 2 months prescriptions for MS Contin 100 mg 1 p.o.     q.12 hours, 60 with no refill. 2. Refill Xanax 1 mg up to q.i.d., 120 with 3 refills. 3. Ambien 10 mg 1 p.o. at bedtime, 30 with 3 refills.  His questions     were encouraged and answered.  He will be see me back in 2 months.     Adalind Weitz L. Blima Dessert Electronically Signed    RLW/MedQ D:  10/16/2011 13:21:50  T:  10/17/2011 04:42:08  Job #:  [id number]

## 2011-11-06 ENCOUNTER — Encounter: Payer: PRIVATE HEALTH INSURANCE | Attending: Neurosurgery | Admitting: Neurosurgery

## 2011-11-06 DIAGNOSIS — M545 Low back pain: Secondary | ICD-10-CM

## 2011-11-06 DIAGNOSIS — G8929 Other chronic pain: Secondary | ICD-10-CM | POA: Insufficient documentation

## 2011-11-06 DIAGNOSIS — G894 Chronic pain syndrome: Secondary | ICD-10-CM

## 2011-11-06 NOTE — Assessment & Plan Note (Signed)
This is a patient of Dr. Riley Kill.  He is seen every 2 months or so for watch for a chronic pain.  Dr. Riley Kill brought him back today before his next scheduled followup due to some difficulties with his last drug screen.  His pills counts according to the nurse are correct today.  He reports no change in his condition or his pain level.  He will follow up as scheduled with Dr. Riley Kill in March.     Linnell Swords L. Blima Dessert Electronically Signed    RLW/MedQ D:  11/06/2011 10:26:47  T:  11/06/2011 10:57:15  Job #:  [id number]

## 2011-12-12 ENCOUNTER — Other Ambulatory Visit: Payer: Self-pay | Admitting: *Deleted

## 2011-12-12 MED ORDER — NORTRIPTYLINE HCL 50 MG PO CAPS: 100.0000 mg | ORAL_CAPSULE | Freq: Every day | ORAL | Status: DC

## 2011-12-13 ENCOUNTER — Other Ambulatory Visit: Payer: Self-pay | Admitting: *Deleted

## 2011-12-18 ENCOUNTER — Telehealth: Payer: Self-pay | Admitting: Physical Medicine & Rehabilitation

## 2011-12-18 MED ORDER — MORPHINE SULFATE CR 100 MG PO TB12: 100.0000 mg | ORAL_TABLET | Freq: Two times a day (BID) | ORAL | Status: DC

## 2011-12-18 NOTE — Telephone Encounter (Signed)
Last seen on 11/06/11 and was given a refill on MS Contin 100mg  1 Q 12hrs #60 on 10/16/11 that was not supposed to be filled until 11/17/11.

## 2011-12-18 NOTE — Telephone Encounter (Signed)
Refill printed.  He should be due it sounds like

## 2011-12-18 NOTE — Telephone Encounter (Signed)
Patient is requesting refill on MS Contin.  Had pharmacy fax over request, but wasn't sure if that was the right thing to do.  He apologizes if he has done this wrong.

## 2011-12-19 NOTE — Telephone Encounter (Signed)
Pt aware that we will have his rx ready for him tomorrow.

## 2011-12-26 ENCOUNTER — Telehealth: Payer: Self-pay | Admitting: Physical Medicine & Rehabilitation

## 2012-01-10 ENCOUNTER — Other Ambulatory Visit: Payer: Self-pay | Admitting: *Deleted

## 2012-01-10 MED ORDER — METAXALONE 800 MG PO TABS: 800.0000 mg | ORAL_TABLET | Freq: Four times a day (QID) | ORAL | Status: DC | PRN

## 2012-01-15 ENCOUNTER — Encounter: Payer: PRIVATE HEALTH INSURANCE | Attending: Neurosurgery | Admitting: Physical Medicine & Rehabilitation

## 2012-01-15 ENCOUNTER — Encounter: Payer: Self-pay | Admitting: Physical Medicine & Rehabilitation

## 2012-01-15 VITALS — BP 125/84 | HR 97 | Resp 16 | Ht 68.0 in | Wt 196.2 lb

## 2012-01-15 DIAGNOSIS — M47817 Spondylosis without myelopathy or radiculopathy, lumbosacral region: Secondary | ICD-10-CM | POA: Insufficient documentation

## 2012-01-15 DIAGNOSIS — M47816 Spondylosis without myelopathy or radiculopathy, lumbar region: Secondary | ICD-10-CM | POA: Insufficient documentation

## 2012-01-15 DIAGNOSIS — Q870 Congenital malformation syndromes predominantly affecting facial appearance: Secondary | ICD-10-CM | POA: Insufficient documentation

## 2012-01-15 DIAGNOSIS — Q759 Congenital malformation of skull and face bones, unspecified: Secondary | ICD-10-CM | POA: Insufficient documentation

## 2012-01-15 MED ORDER — MORPHINE SULFATE CR 100 MG PO TB12: 100.0000 mg | ORAL_TABLET | Freq: Two times a day (BID) | ORAL | Status: DC

## 2012-01-15 NOTE — Patient Instructions (Signed)
Continue activity and good posture!

## 2012-01-15 NOTE — Progress Notes (Signed)
  Subjective:    Patient ID: Kenneth Farrell, male    DOB: April 01, 1971, 41 y.o.   MRN: [id number]  HPI [name] is back regarding his chronic back and shoulder pain. He's doing fairly well overall.  He has had a little increase in his pain as he increases his activity with his summer chores.  His MS Contin controls his pain.  He has no substantial side effects.  He is moving his bowels regularly. He has no bladder without problems.   Pain Inventory Average Pain 5 Pain Right Now 4 My pain is constant, sharp, dull, stabbing and aching  In the last 24 hours, has pain interfered with the following? General activity 5 Relation with others 4 Enjoyment of life 4 What TIME of day is your pain at its worst? all of the time Sleep (in general) Fair  Pain is worse with: walking, bending, sitting, inactivity, standing and some activites Pain improves with: rest, therapy/exercise, pacing activities and medication Relief from Meds: 5  Mobility walk without assistance how many minutes can you walk? 10-15 ability to climb steps?  yes do you drive?  yes  Function disabled: date disabled 4  Neuro/Psych spasms anxiety  Prior Studies Any changes since last visit?  no  Physicians involved in your care Any changes since last visit?  no      Review of Systems  Musculoskeletal:       Spasms  Psychiatric/Behavioral: The patient is nervous/anxious.   All other systems reviewed and are negative.       Objective:   Physical Exam  Constitutional: He appears well-developed and well-nourished.  HENT:  Head: Normocephalic and atraumatic.  Eyes: Conjunctivae and EOM are normal. Pupils are equal, round, and reactive to light.  Neck: Normal range of motion. Neck supple.  Cardiovascular: Normal rate and regular rhythm.   Pulmonary/Chest: Effort normal and breath sounds normal.  Abdominal: Soft. Bowel sounds are normal.  Musculoskeletal: Normal range of motion.       Pt with persistent  limitations in the right low back in regards to ROM. Some pain with end ROM in the thoracic and lumbar spine. Baseline hypoplasia present in scapula and strength.  Neurological: He is alert. He has normal strength and normal reflexes.  Psychiatric: He has a normal mood and affect. His behavior is normal. Judgment and thought content normal.          Assessment & Plan:  ASSESSMENT:  1. 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  2. Chronic low back pain.  PLAN:  1. I refilled his MS Contin 100 mg q.12 h. #60 with a second refill  for next month. Continue with exercise and appropriate posture and technique as we have discussed before.  2. We will see him back here in about 2 months' time.

## 2012-01-16 ENCOUNTER — Encounter: Payer: Self-pay | Admitting: Physical Medicine & Rehabilitation

## 2012-01-23 ENCOUNTER — Other Ambulatory Visit: Payer: Self-pay

## 2012-01-23 MED ORDER — ZOLPIDEM TARTRATE 10 MG PO TABS: 10.0000 mg | ORAL_TABLET | Freq: Every day | ORAL | Status: DC

## 2012-01-23 MED ORDER — ALPRAZOLAM 1 MG PO TABS: 1.0000 mg | ORAL_TABLET | Freq: Four times a day (QID) | ORAL | Status: DC

## 2012-02-11 ENCOUNTER — Other Ambulatory Visit: Payer: Self-pay | Admitting: *Deleted

## 2012-02-11 MED ORDER — NORTRIPTYLINE HCL 50 MG PO CAPS: 100.0000 mg | ORAL_CAPSULE | Freq: Every day | ORAL | Status: DC

## 2012-03-11 ENCOUNTER — Encounter
Payer: PRIVATE HEALTH INSURANCE | Attending: Physical Medicine & Rehabilitation | Admitting: Physical Medicine and Rehabilitation

## 2012-03-11 ENCOUNTER — Encounter: Payer: Self-pay | Admitting: Physical Medicine and Rehabilitation

## 2012-03-11 VITALS — BP 140/89 | HR 87 | Resp 16 | Ht 68.0 in | Wt 195.0 lb

## 2012-03-11 DIAGNOSIS — M545 Low back pain, unspecified: Secondary | ICD-10-CM | POA: Insufficient documentation

## 2012-03-11 DIAGNOSIS — Q759 Congenital malformation of skull and face bones, unspecified: Secondary | ICD-10-CM | POA: Insufficient documentation

## 2012-03-11 DIAGNOSIS — M48061 Spinal stenosis, lumbar region without neurogenic claudication: Secondary | ICD-10-CM | POA: Insufficient documentation

## 2012-03-11 DIAGNOSIS — M542 Cervicalgia: Secondary | ICD-10-CM | POA: Insufficient documentation

## 2012-03-11 DIAGNOSIS — G8929 Other chronic pain: Secondary | ICD-10-CM | POA: Insufficient documentation

## 2012-03-11 MED ORDER — MORPHINE SULFATE ER 100 MG PO TBCR: 100.0000 mg | EXTENDED_RELEASE_TABLET | Freq: Two times a day (BID) | ORAL | Status: DC

## 2012-03-11 NOTE — Progress Notes (Signed)
Subjective:    Patient ID: Kenneth Farrell, male    DOB: 1971-03-17, 41 y.o.   MRN: [id number]  HPI The patient complains about chronic low back pain, which radiates into his LEs bilateral.  The patient also complains about chronic neck pain . The problem has been stable . The patient reports that he has a dog now and walks every day. Pain Inventory Average Pain 5 Pain Right Now 4 My pain is constant, sharp, dull, stabbing and aching  In the last 24 hours, has pain interfered with the following? General activity 4 Relation with others 3 Enjoyment of life 4 What TIME of day is your pain at its worst? all of the time Sleep (in general) Fair  Pain is worse with: walking, bending, sitting, inactivity, standing and some activites Pain improves with: rest, heat/ice, therapy/exercise, pacing activities and medication Relief from Meds: 5  Mobility walk without assistance ability to climb steps?  yes do you drive?  yes  Function disabled: date disabled 1993-4  Neuro/Psych spasms anxiety  Prior Studies Any changes since last visit?  no  Physicians involved in your care Any changes since last visit?  no   Family History  Problem Relation Age of Onset  . Hypertension Mother   . Hypertension Father    History   Social History  . Marital Status: Single    Spouse Name: N/A    Number of Children: N/A  . Years of Education: N/A   Social History Main Topics  . Smoking status: Former Smoker    Quit date: 06/27/2011  . Smokeless tobacco: Never Used  . Alcohol Use: None  . Drug Use: None  . Sexually Active: None   Other Topics Concern  . None   Social History Narrative  . None   Past Surgical History  Procedure Date  . Spine surgery   . Urinary tract surgery   . Hernia repair    Past Medical History  Diagnosis Date  . Chronic pain syndrome   . Kyphoscoliosis and scoliosis   . Lumbago   . 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 Facet syndrome, lumbar    . Anxiety    BP 140/89  Pulse 87  Resp 16  Ht 5\' 8"  (1.727 m)  Wt 195 lb (88.451 kg)  BMI 29.65 kg/m2  SpO2 99%    Review of Systems  Musculoskeletal: Positive for back pain.       Spasms  Psychiatric/Behavioral: The patient is nervous/anxious.   All other systems reviewed and are negative.       Objective:   Physical Exam  Constitutional: He is oriented to person, place, and time. He appears well-developed and well-nourished.  HENT:       Hemifacial microsomia  Neck: Neck supple.  Musculoskeletal:       scoliosis  Neurological: He is alert and oriented to person, place, and time.  Skin: Skin is warm and dry.  Psychiatric: He has a normal mood and affect.    Symmetric normal motor tone is noted throughout. Normal muscle bulk. Muscle testing reveals 5/5 muscle strength of the upper extremity, and 5/5 of the lower extremity, except right quadriceps 4/5, . Full range of motion in upper and lower extremities. ROM of spine is  restricted. Fine motor movements are normal in both hands.  DTR in the upper and lower extremity are present and symmetric 2+. No clonus is noted.  Patient arises from chair without difficulty. Narrow based gait,  spine forward flexed, with normal arm swing bilateral , able to walk on heels and toes . Tandem walk is stable.        Assessment & Plan:  This is a 41 year old  male with 1.Goldenhar syndrome 2.Lumbar stenosis 3. Low back pain, radiating into his LE bilateral 4. Neck pain Plan : Continue with the medication. Continue with exercise and walking program. I also advised the patient to pay attention to his posture. I showed him some tricks to correct his posture. Advised patient to exercise and walk in the pool, which he had enjoyed in the past, and which has helped him to relief his symptoms and built up strength. Follow up in 2 month. Refilled patient's MS Contin, and also gave him an additional prescription for the following month.  The  Opioid medication pill count was appropriate. No reported significant side effects of Opioid medications noted. No aberrant behavior noted. Patient cautioned regarding operation of machinery or vehicles. Patient understands risk and benefits of these medications. There is no indication or report of risk to self or others.

## 2012-03-11 NOTE — Patient Instructions (Signed)
Continue with stretching exercising, and walking program. Continue with posture training and keeping attention about weight.

## 2012-03-23 ENCOUNTER — Other Ambulatory Visit: Payer: Self-pay | Admitting: *Deleted

## 2012-03-23 MED ORDER — ALPRAZOLAM 1 MG PO TABS: 1.0000 mg | ORAL_TABLET | Freq: Four times a day (QID) | ORAL | Status: DC

## 2012-03-23 MED ORDER — ZOLPIDEM TARTRATE 10 MG PO TABS: 10.0000 mg | ORAL_TABLET | Freq: Every day | ORAL | Status: DC

## 2012-04-10 ENCOUNTER — Other Ambulatory Visit: Payer: Self-pay | Admitting: *Deleted

## 2012-04-10 MED ORDER — NORTRIPTYLINE HCL 50 MG PO CAPS: 100.0000 mg | ORAL_CAPSULE | Freq: Every day | ORAL | Status: DC

## 2012-05-13 ENCOUNTER — Encounter
Payer: PRIVATE HEALTH INSURANCE | Attending: Physical Medicine and Rehabilitation | Admitting: Physical Medicine and Rehabilitation

## 2012-05-13 ENCOUNTER — Encounter: Payer: Self-pay | Admitting: Physical Medicine and Rehabilitation

## 2012-05-13 ENCOUNTER — Other Ambulatory Visit: Payer: Self-pay | Admitting: Physical Medicine and Rehabilitation

## 2012-05-13 VITALS — BP 139/86 | HR 93 | Resp 14 | Ht 68.0 in | Wt 187.0 lb

## 2012-05-13 DIAGNOSIS — M549 Dorsalgia, unspecified: Secondary | ICD-10-CM

## 2012-05-13 DIAGNOSIS — M48061 Spinal stenosis, lumbar region without neurogenic claudication: Secondary | ICD-10-CM | POA: Insufficient documentation

## 2012-05-13 DIAGNOSIS — M545 Low back pain, unspecified: Secondary | ICD-10-CM | POA: Insufficient documentation

## 2012-05-13 DIAGNOSIS — M47817 Spondylosis without myelopathy or radiculopathy, lumbosacral region: Secondary | ICD-10-CM

## 2012-05-13 DIAGNOSIS — Q759 Congenital malformation of skull and face bones, unspecified: Secondary | ICD-10-CM | POA: Insufficient documentation

## 2012-05-13 DIAGNOSIS — G8929 Other chronic pain: Secondary | ICD-10-CM | POA: Insufficient documentation

## 2012-05-13 DIAGNOSIS — M542 Cervicalgia: Secondary | ICD-10-CM | POA: Insufficient documentation

## 2012-05-13 DIAGNOSIS — M47816 Spondylosis without myelopathy or radiculopathy, lumbar region: Secondary | ICD-10-CM

## 2012-05-13 MED ORDER — MORPHINE SULFATE ER 100 MG PO TBCR: 100.0000 mg | EXTENDED_RELEASE_TABLET | Freq: Two times a day (BID) | ORAL | Status: DC

## 2012-05-13 NOTE — Patient Instructions (Signed)
Continue with walking with your dog, stay as active as possible.

## 2012-05-13 NOTE — Progress Notes (Signed)
Subjective:    Patient ID: Kenneth Farrell, male    DOB: [birth year], 41 y.o.   MRN: [id number]  HPI The patient complains about chronic low back pain, which radiates into his LEs bilateral. The patient also complains about chronic neck pain .  The problem has been stable . The patient reports that he has a dog now and walks every day.  Pain Inventory Average Pain 5 Pain Right Now 5 My pain is sharp, dull, stabbing, tingling and aching  In the last 24 hours, has pain interfered with the following? General activity 5 Relation with others 5 Enjoyment of life 5 What TIME of day is your pain at its worst? all the time Sleep (in general) Fair  Pain is worse with: walking, bending, sitting, inactivity, standing and some activites Pain improves with: rest, heat/ice, therapy/exercise, pacing activities and medication Relief from Meds: 5  Mobility walk without assistance how many minutes can you walk? 10-15 ability to climb steps?  yes do you drive?  yes  Function disabled: date disabled 40  Neuro/Psych spasms anxiety  Prior Studies Any changes since last visit?  no  Physicians involved in your care Any changes since last visit?  no   Family History  Problem Relation Age of Onset  . Hypertension Mother   . Hypertension Father    History   Social History  . Marital Status: Single    Spouse Name: N/A    Number of Children: N/A  . Years of Education: N/A   Social History Main Topics  . Smoking status: Former Smoker    Quit date: 06/27/2011  . Smokeless tobacco: Never Used  . Alcohol Use: None  . Drug Use: None  . Sexually Active: None   Other Topics Concern  . None   Social History Narrative  . None   Past Surgical History  Procedure Date  . Spine surgery   . Urinary tract surgery   . Hernia repair    Past Medical History  Diagnosis Date  . Chronic pain syndrome   . Kyphoscoliosis and scoliosis   . Lumbago   . Congenital anomalies of skull and  face bones   . Facet syndrome, lumbar   . Anxiety    BP 139/86  Pulse 93  Resp 14  Ht 5\' 8"  (1.727 m)  Wt 187 lb (84.823 kg)  BMI 28.43 kg/m2  SpO2 99%     Review of Systems  Musculoskeletal: Positive for myalgias, back pain and arthralgias.  Psychiatric/Behavioral: The patient is nervous/anxious.   All other systems reviewed and are negative.       Objective:   Physical Exam Constitutional: He is oriented to person, place, and time. He appears well-developed and well-nourished.  HENT:  Hemifacial microsomia  Neck: Neck supple.  Musculoskeletal:  scoliosis  Neurological: He is alert and oriented to person, place, and time.  Skin: Skin is warm and dry.  Psychiatric: He has a normal mood and affect.   Symmetric normal motor tone is noted throughout. Normal muscle bulk. Muscle testing reveals 5/5 muscle strength of the upper extremity, and 5/5 of the lower extremity, except right quadriceps 4/5, . Full range of motion in upper and lower extremities. ROM of spine is restricted. Fine motor movements are normal in both hands.  DTR in the upper and lower extremity are present and symmetric 2+. No clonus is noted.  Patient arises from chair without difficulty. Narrow based gait, spine forward flexed, with normal arm swing bilateral ,  able to walk on heels and toes . Tandem walk is stable.         Assessment & Plan:  This is a 41 year old male with  1.Goldenhar syndrome  2.Lumbar stenosis  3. Low back pain, radiating into his LE bilateral  4. Neck pain  Plan : Continue with the medication. Continue with exercise and walking program. I also advised the patient to pay attention to his posture. I showed him some tricks to correct his posture. Advised patient to exercise and walk in the pool, which he had enjoyed in the past, and which has helped him to relief his symptoms and built up strength.  Follow up in 2 month.  Refilled patient's MS Contin, and also gave him an  additional prescription for the following month.

## 2012-05-22 ENCOUNTER — Other Ambulatory Visit: Payer: Self-pay

## 2012-05-22 ENCOUNTER — Encounter: Payer: Self-pay | Admitting: Physical Medicine and Rehabilitation

## 2012-05-22 MED ORDER — ZOLPIDEM TARTRATE 10 MG PO TABS: 10.0000 mg | ORAL_TABLET | Freq: Every day | ORAL | Status: DC

## 2012-05-22 MED ORDER — ALPRAZOLAM 1 MG PO TABS: 1.0000 mg | ORAL_TABLET | Freq: Four times a day (QID) | ORAL | Status: DC

## 2012-06-11 ENCOUNTER — Other Ambulatory Visit: Payer: Self-pay

## 2012-06-11 MED ORDER — NORTRIPTYLINE HCL 50 MG PO CAPS: 100.0000 mg | ORAL_CAPSULE | Freq: Every day | ORAL | Status: DC

## 2012-07-15 ENCOUNTER — Encounter
Payer: PRIVATE HEALTH INSURANCE | Attending: Physical Medicine and Rehabilitation | Admitting: Physical Medicine and Rehabilitation

## 2012-07-15 ENCOUNTER — Encounter: Payer: Self-pay | Admitting: Physical Medicine and Rehabilitation

## 2012-07-15 VITALS — BP 153/95 | HR 77 | Resp 15 | Ht 68.0 in | Wt 191.0 lb

## 2012-07-15 DIAGNOSIS — M545 Low back pain, unspecified: Secondary | ICD-10-CM | POA: Insufficient documentation

## 2012-07-15 DIAGNOSIS — Q759 Congenital malformation of skull and face bones, unspecified: Secondary | ICD-10-CM | POA: Insufficient documentation

## 2012-07-15 DIAGNOSIS — M48061 Spinal stenosis, lumbar region without neurogenic claudication: Secondary | ICD-10-CM | POA: Insufficient documentation

## 2012-07-15 DIAGNOSIS — M542 Cervicalgia: Secondary | ICD-10-CM | POA: Insufficient documentation

## 2012-07-15 DIAGNOSIS — G8929 Other chronic pain: Secondary | ICD-10-CM | POA: Insufficient documentation

## 2012-07-15 DIAGNOSIS — M47817 Spondylosis without myelopathy or radiculopathy, lumbosacral region: Secondary | ICD-10-CM

## 2012-07-15 DIAGNOSIS — M47816 Spondylosis without myelopathy or radiculopathy, lumbar region: Secondary | ICD-10-CM

## 2012-07-15 MED ORDER — MORPHINE SULFATE ER 100 MG PO TBCR: 100.0000 mg | EXTENDED_RELEASE_TABLET | Freq: Two times a day (BID) | ORAL | Status: DC

## 2012-07-15 MED ORDER — ALPRAZOLAM 1 MG PO TABS: 1.0000 mg | ORAL_TABLET | Freq: Four times a day (QID) | ORAL | Status: DC

## 2012-07-15 MED ORDER — DICLOFENAC EPOLAMINE 1.3 % TD PTCH: 1.0000 | MEDICATED_PATCH | Freq: Two times a day (BID) | TRANSDERMAL | Status: DC

## 2012-07-15 NOTE — Progress Notes (Signed)
Subjective:    Patient ID: Kenneth Farrell, male    DOB: 05-13-1971, 41 y.o.   MRN: [id number]  HPI The patient complains about chronic low back pain, which radiates into his LEs bilateral. The patient also complains about chronic neck pain .  The problem has been stable, although he complains about mild exacerbation of his LBP with the cooler weather . The patient reports that he has a dog now and walks every day.  Pain Inventory Average Pain 6 Pain Right Now 6 My pain is sharp, dull, stabbing, tingling and aching  In the last 24 hours, has pain interfered with the following? General activity 5 Relation with others 4 Enjoyment of life 5 What TIME of day is your pain at its worst? all the time Sleep (in general) Fair  Pain is worse with: walking, bending, sitting, inactivity, standing and some activites Pain improves with: rest, heat/ice, therapy/exercise, pacing activities and medication Relief from Meds: 5  Mobility walk without assistance how many minutes can you walk? 10-15 ability to climb steps?  yes do you drive?  yes  Function disabled: date disabled 51  Neuro/Psych trouble walking spasms anxiety  Prior Studies Any changes since last visit?  no  Physicians involved in your care Any changes since last visit?  no   Family History  Problem Relation Age of Onset  . Hypertension Mother   . Hypertension Father    History   Social History  . Marital Status: Single    Spouse Name: N/A    Number of Children: N/A  . Years of Education: N/A   Social History Main Topics  . Smoking status: Former Smoker    Quit date: 06/27/2011  . Smokeless tobacco: Never Used  . Alcohol Use: None  . Drug Use: None  . Sexually Active: None   Other Topics Concern  . None   Social History Narrative  . None   Past Surgical History  Procedure Date  . Spine surgery   . Urinary tract surgery   . Hernia repair    Past Medical History  Diagnosis Date  . Chronic pain  syndrome   . Kyphoscoliosis and scoliosis   . Lumbago   . 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 Facet syndrome, lumbar   . Anxiety    BP 153/95  Pulse 77  Resp 15  Ht 5\' 8"  (1.727 m)  Wt 191 lb (86.637 kg)  BMI 29.04 kg/m2  SpO2 97%     Review of Systems  Musculoskeletal: Positive for back pain and gait problem.  Psychiatric/Behavioral: The patient is nervous/anxious.   All other systems reviewed and are negative.       Objective:   Physical Exam Constitutional: He is oriented to person, place, and time. He appears well-developed and well-nourished.  HENT:  Hemifacial microsomia  Neck: Neck supple.  Musculoskeletal:  scoliosis  Neurological: He is alert and oriented to person, place, and time.  Skin: Skin is warm and dry.  Psychiatric: He has a normal mood and affect.  Symmetric normal motor tone is noted throughout. Normal muscle bulk. Muscle testing reveals 5/5 muscle strength of the upper extremity, and 5/5 of the lower extremity, except right quadriceps 4/5, . Full range of motion in upper and lower extremities. ROM of spine is restricted. Fine motor movements are normal in both hands.  DTR in the upper and lower extremity are present and symmetric 2+. No clonus is noted.  Patient arises from chair  without difficulty. Narrow based gait, spine forward flexed, with normal arm swing bilateral , able to walk on heels and toes . Tandem walk is stable.         Assessment & Plan:  This is a 41 year old male with  1.Goldenhar syndrome  2.Lumbar stenosis  3. Low back pain, radiating into his LE bilateral , some exacerbation, since the weather got a little cooler, prescribed Flector patches for those exacerbations/ lumbar strains. 4. Neck pain , has gotten better after he pays more attention to his posture. Plan : Continue with the medication. Continue with exercise and walking program. I also advised the patient to pay attention to his posture. I showed him  some tricks to correct his posture. Advised patient to exercise and walk, which helps him to relief his symptoms and built up strength.  Follow up in 2 month.  Refilled patient's MS Contin, and also gave him an additional prescription for the following month.

## 2012-07-15 NOTE — Patient Instructions (Signed)
Continue with your walking program, continue with your stretching exercises, keep a good posture.

## 2012-07-21 ENCOUNTER — Other Ambulatory Visit: Payer: Self-pay | Admitting: *Deleted

## 2012-07-21 MED ORDER — ZOLPIDEM TARTRATE 10 MG PO TABS: 10.0000 mg | ORAL_TABLET | Freq: Every day | ORAL | Status: DC

## 2012-08-19 ENCOUNTER — Telehealth: Payer: Self-pay | Admitting: *Deleted

## 2012-08-19 NOTE — Telephone Encounter (Signed)
Received fax from [facility]. Patients Flector Patch 1.3% is not covered by his insurance plan. Please advise.

## 2012-08-20 NOTE — Telephone Encounter (Signed)
Please let them send you an authorization form for me to fill out

## 2012-08-21 NOTE — Telephone Encounter (Signed)
Lm advising patient Flector patch was denied and we are working on getting it approved.

## 2012-08-21 NOTE — Telephone Encounter (Signed)
Lm for [facility] to have them resend prior auth request.

## 2012-08-21 NOTE — Telephone Encounter (Signed)
Prior auth given to karen to fill out.

## 2012-08-24 ENCOUNTER — Telehealth: Payer: Self-pay

## 2012-08-24 NOTE — Telephone Encounter (Signed)
Crystal called to get clarification on quantity for flector patch prior auth.  Patch approved.  #[id number]

## 2012-09-09 ENCOUNTER — Encounter
Payer: PRIVATE HEALTH INSURANCE | Attending: Physical Medicine and Rehabilitation | Admitting: Physical Medicine and Rehabilitation

## 2012-09-09 ENCOUNTER — Encounter: Payer: Self-pay | Admitting: Physical Medicine and Rehabilitation

## 2012-09-09 VITALS — BP 157/100 | HR 96 | Resp 18 | Ht 68.0 in | Wt 194.0 lb

## 2012-09-09 DIAGNOSIS — M545 Low back pain, unspecified: Secondary | ICD-10-CM | POA: Insufficient documentation

## 2012-09-09 DIAGNOSIS — Q759 Congenital malformation of skull and face bones, unspecified: Secondary | ICD-10-CM | POA: Insufficient documentation

## 2012-09-09 DIAGNOSIS — G8929 Other chronic pain: Secondary | ICD-10-CM | POA: Insufficient documentation

## 2012-09-09 DIAGNOSIS — M542 Cervicalgia: Secondary | ICD-10-CM | POA: Insufficient documentation

## 2012-09-09 DIAGNOSIS — M47817 Spondylosis without myelopathy or radiculopathy, lumbosacral region: Secondary | ICD-10-CM

## 2012-09-09 DIAGNOSIS — M47816 Spondylosis without myelopathy or radiculopathy, lumbar region: Secondary | ICD-10-CM

## 2012-09-09 DIAGNOSIS — M48061 Spinal stenosis, lumbar region without neurogenic claudication: Secondary | ICD-10-CM | POA: Insufficient documentation

## 2012-09-09 MED ORDER — MORPHINE SULFATE ER 100 MG PO TBCR: 100.0000 mg | EXTENDED_RELEASE_TABLET | Freq: Two times a day (BID) | ORAL | Status: DC

## 2012-09-09 MED ORDER — ZOLPIDEM TARTRATE 10 MG PO TABS: 10.0000 mg | ORAL_TABLET | Freq: Every day | ORAL | Status: DC

## 2012-09-09 MED ORDER — ALPRAZOLAM 1 MG PO TABS: 1.0000 mg | ORAL_TABLET | Freq: Four times a day (QID) | ORAL | Status: DC

## 2012-09-09 NOTE — Patient Instructions (Signed)
Continue with walking with your dog.

## 2012-09-09 NOTE — Progress Notes (Signed)
Subjective:    Patient ID: Kenneth Farrell, male    DOB: [birth year], 41 y.o.   MRN: [id number]  HPI The patient complains about chronic low back pain, which radiates into his LEs bilateral. The patient also complains about chronic neck pain .  The problem has been stable, although he complains about mild exacerbation of his LBP with the cooler weather . The patient reports that he has a dog now and walks every day.  He reports, that he had some trouble to get his prescription for his MS-Contin [place] in [place] county.  Pain Inventory Average Pain 7 Pain Right Now 7 My pain is intermittent, constant, sharp, dull, stabbing, tingling and aching  In the last 24 hours, has pain interfered with the following? General activity 6 Relation with others 5 Enjoyment of life 6 What TIME of day is your pain at its worst? all the time Sleep (in general) Fair  Pain is worse with: walking, bending, sitting, inactivity, standing, unsure and some activites Pain improves with: rest, heat/ice, therapy/exercise, pacing activities and medication Relief from Meds: 5  Mobility walk without assistance how many minutes can you walk? 10-15 ability to climb steps?  yes do you drive?  yes  Function disabled: date disabled   Neuro/Psych spasms anxiety  Prior Studies Any changes since last visit?  no  Physicians involved in your care Any changes since last visit?  no   Family History  Problem Relation Age of Onset  . Hypertension Mother   . Hypertension Father    History   Social History  . Marital Status: Single    Spouse Name: N/A    Number of Children: N/A  . Years of Education: N/A   Social History Main Topics  . Smoking status: Former Smoker    Quit date: 06/27/2011  . Smokeless tobacco: Never Used  . Alcohol Use: None  . Drug Use: None  . Sexually Active: None   Other Topics Concern  . None   Social History Narrative  . None   Past Surgical History  Procedure Date  . Spine  surgery   . Urinary tract surgery   . Hernia repair    Past Medical History  Diagnosis Date  . Chronic pain syndrome   . Kyphoscoliosis and scoliosis   . Lumbago   . 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 Facet syndrome, lumbar   . Anxiety    BP 157/100  Pulse 96  Resp 18  Ht 5\' 8"  (1.727 m)  Wt 194 lb (87.998 kg)  BMI 29.50 kg/m2  SpO2 100%    Review of Systems  Musculoskeletal: Positive for back pain.  Neurological:       Spasms  Psychiatric/Behavioral: The patient is nervous/anxious.   All other systems reviewed and are negative.       Objective:   Physical Exam Constitutional: He is oriented to person, place, and time. He appears well-developed and well-nourished.  HENT:  Hemifacial microsomia  Neck: Neck supple.  Musculoskeletal:  scoliosis  Neurological: He is alert and oriented to person, place, and time.  Skin: Skin is warm and dry.  Psychiatric: He has a normal mood and affect.  Symmetric normal motor tone is noted throughout. Normal muscle bulk. Muscle testing reveals 5/5 muscle strength of the upper extremity, and 5/5 of the lower extremity, except right quadriceps 4/5, . Full range of motion in upper and lower extremities. ROM of spine is restricted. Fine motor movements are normal  in both hands.  DTR in the upper and lower extremity are present and symmetric 2+. No clonus is noted.  Patient arises from chair without difficulty. Narrow based gait, spine forward flexed, with normal arm swing bilateral , able to walk on heels and toes . Tandem walk is stable.         Assessment & Plan:  This is a 41 year old male with  1.Goldenhar syndrome  2.Lumbar stenosis  3. Low back pain, radiating into his LE bilateral , some exacerbation, since the weather got a little cooler, prescribed Flector patches for those exacerbations/ lumbar strains.  4. Neck pain , has gotten better after he pays more attention to his posture.  Plan : Continue with the  medication. Continue with exercise and walking program. I also advised the patient to pay attention to his posture. I showed him some tricks to correct his posture. Advised patient to exercise and walk, which helps him to relief his symptoms and built up strength.  Follow up in 2 month.  Refilled patient's MS Contin, and also gave him an additional prescription for the following month. We called [facility], to make sure they can fill his prescription, he might switch his pharmacy to them.

## 2012-10-12 ENCOUNTER — Other Ambulatory Visit: Payer: Self-pay

## 2012-10-12 MED ORDER — NORTRIPTYLINE HCL 50 MG PO CAPS: 100.0000 mg | ORAL_CAPSULE | Freq: Every day | ORAL | Status: DC

## 2012-10-14 ENCOUNTER — Telehealth: Payer: Self-pay

## 2012-10-14 NOTE — Telephone Encounter (Signed)
Patient called to follow up on nortriptyline request.  Advised him this was electronically sent to [facility].  Patient is going to follow up with pharmacy.

## 2012-11-11 ENCOUNTER — Encounter: Payer: Self-pay | Admitting: Physical Medicine and Rehabilitation

## 2012-11-11 ENCOUNTER — Encounter
Payer: PRIVATE HEALTH INSURANCE | Attending: Physical Medicine and Rehabilitation | Admitting: Physical Medicine and Rehabilitation

## 2012-11-11 VITALS — BP 143/90 | HR 94 | Resp 14 | Ht 68.0 in | Wt 201.0 lb

## 2012-11-11 DIAGNOSIS — M47817 Spondylosis without myelopathy or radiculopathy, lumbosacral region: Secondary | ICD-10-CM

## 2012-11-11 DIAGNOSIS — G8929 Other chronic pain: Secondary | ICD-10-CM | POA: Insufficient documentation

## 2012-11-11 DIAGNOSIS — M79609 Pain in unspecified limb: Secondary | ICD-10-CM | POA: Insufficient documentation

## 2012-11-11 DIAGNOSIS — M545 Low back pain, unspecified: Secondary | ICD-10-CM | POA: Insufficient documentation

## 2012-11-11 DIAGNOSIS — Z5181 Encounter for therapeutic drug level monitoring: Secondary | ICD-10-CM

## 2012-11-11 DIAGNOSIS — M47816 Spondylosis without myelopathy or radiculopathy, lumbar region: Secondary | ICD-10-CM

## 2012-11-11 DIAGNOSIS — M542 Cervicalgia: Secondary | ICD-10-CM | POA: Insufficient documentation

## 2012-11-11 DIAGNOSIS — M48061 Spinal stenosis, lumbar region without neurogenic claudication: Secondary | ICD-10-CM | POA: Insufficient documentation

## 2012-11-11 DIAGNOSIS — Q759 Congenital malformation of skull and face bones, unspecified: Secondary | ICD-10-CM

## 2012-11-11 MED ORDER — MORPHINE SULFATE ER 100 MG PO TBCR: 100.0000 mg | EXTENDED_RELEASE_TABLET | Freq: Two times a day (BID) | ORAL | Status: DC

## 2012-11-11 MED ORDER — ALPRAZOLAM 1 MG PO TABS: 1.0000 mg | ORAL_TABLET | Freq: Four times a day (QID) | ORAL | Status: DC

## 2012-11-11 MED ORDER — ZOLPIDEM TARTRATE 10 MG PO TABS: 10.0000 mg | ORAL_TABLET | Freq: Every day | ORAL | Status: DC

## 2012-11-11 NOTE — Patient Instructions (Signed)
Continue with staying as active as pain permits. Continue with your walking.

## 2012-11-11 NOTE — Addendum Note (Signed)
Addended by: Doreene Eland on: 11/11/2012 02:46 PM   Modules accepted: Orders

## 2012-11-11 NOTE — Progress Notes (Signed)
Subjective:    Patient ID: Kenneth Farrell, male    DOB: [birth year], 42 y.o.   MRN: [id number]  HPI The patient complains about chronic low back pain, which radiates into his LEs bilateral. The patient also complains about chronic neck pain .  The problem has been stable, although he complains about mild exacerbation of his LBP with the cooler weather . The patient reports that he has a dog now and walks every day.  He reports, that he had some trouble to get his prescription for [place]Contin [place].  Pain Inventory Average Pain 6 Pain Right Now 6 My pain is sharp, burning, dull, stabbing, tingling and aching  In the last 24 hours, has pain interfered with the following? General activity 6 Relation with others 6 Enjoyment of life 5 What TIME of day is your pain at its worst? all the time Sleep (in general) Fair  Pain is worse with: walking, bending, inactivity, standing and some activites Pain improves with: rest, therapy/exercise, pacing activities and medication Relief from Meds: 5  Mobility walk without assistance how many minutes can you walk? 10-15 ability to climb steps?  yes do you drive?  yes  Function disabled: date disabled 43  Neuro/Psych spasms anxiety  Prior Studies Any changes since last visit?  no  Physicians involved in your care Dr Margo Aye - dermatologist   Family History  Problem Relation Age of Onset  . Hypertension Mother   . Hypertension Father    History   Social History  . Marital Status: Single    Spouse Name: N/A    Number of Children: N/A  . Years of Education: N/A   Social History Main Topics  . Smoking status: Former Smoker    Quit date: 06/27/2011  . Smokeless tobacco: Never Used  . Alcohol Use: None  . Drug Use: None  . Sexually Active: None   Other Topics Concern  . None   Social History Narrative  . None   Past Surgical History  Procedure Date  . Spine surgery   . Urinary tract surgery   . Hernia  repair    Past Medical History  Diagnosis Date  . Chronic pain syndrome   . Kyphoscoliosis and scoliosis   . Lumbago   . 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 Facet syndrome, lumbar   . Anxiety    BP 143/90  Pulse 94  Resp 14  Ht 5\' 8"  (1.727 m)  Wt 201 lb (91.173 kg)  BMI 30.56 kg/m2  SpO2 99%     Review of Systems  Musculoskeletal: Positive for back pain.  Psychiatric/Behavioral: The patient is nervous/anxious.   All other systems reviewed and are negative.       Objective:   Physical Exam Constitutional: He is oriented to person, place, and time. He appears well-developed and well-nourished.  HENT:  Hemifacial microsomia  Neck: Neck supple.  Musculoskeletal:  scoliosis  Neurological: He is alert and oriented to person, place, and time.  Skin: Skin is warm and dry.  Psychiatric: He has a normal mood and affect.  Symmetric normal motor tone is noted throughout. Normal muscle bulk. Muscle testing reveals 5/5 muscle strength of the upper extremity, and 5/5 of the lower extremity, except right quadriceps 4/5, . Full range of motion in upper and lower extremities. ROM of spine is restricted. Fine motor movements are normal in both hands.  DTR in the upper and lower extremity are present and symmetric  2+. No clonus is noted.  Patient arises from chair without difficulty. Narrow based gait, spine forward flexed, with normal arm swing bilateral , able to walk on heels and toes . Tandem walk is stable.        Assessment & Plan:  This is a 42 year old male with  1.Goldenhar syndrome  2.Lumbar stenosis  3. Low back pain, radiating into his LE bilateral , some exacerbation, since the weather got a little cooler, prescribed Flector patches for those exacerbations/ lumbar strains.  4. Neck pain , has gotten better after he pays more attention to his posture.  Plan : Continue with the medication. Continue with exercise and walking program. I also advised the  patient to pay attention to his posture. I showed him some tricks to correct his posture. Advised patient to exercise and walk, which helps him to relief his symptoms and built up strength.  Follow up in 2 month with Dr. Riley Kill.  Refilled patient's MS Contin, and also gave him an additional prescription for the following month. We called [facility], to make sure they can fill his prescription, he might switch his pharmacy to them. He still has trouble to get his medication from the pharmacy close to him, and it is very expensive for him to drive to [facility] just to pick up his meds, he would like to talk to Dr. Riley Kill to maybe change his meds to one his home town pharmacy carries. Also refilled his Xanax and Ambien, consider referral to psychiatry to re evaluate , and maybe wean down from those medications.

## 2012-12-11 ENCOUNTER — Other Ambulatory Visit: Payer: Self-pay | Admitting: Physical Medicine and Rehabilitation

## 2012-12-15 ENCOUNTER — Other Ambulatory Visit: Payer: Self-pay | Admitting: Physical Medicine and Rehabilitation

## 2012-12-16 ENCOUNTER — Telehealth: Payer: Self-pay | Admitting: *Deleted

## 2012-12-16 NOTE — Telephone Encounter (Signed)
Following up on nortriptyline

## 2012-12-16 NOTE — Telephone Encounter (Signed)
Patient informed nortriptyline has been refilled.

## 2013-01-13 ENCOUNTER — Encounter: Payer: Self-pay | Admitting: Physical Medicine & Rehabilitation

## 2013-01-13 ENCOUNTER — Encounter
Payer: PRIVATE HEALTH INSURANCE | Attending: Physical Medicine and Rehabilitation | Admitting: Physical Medicine & Rehabilitation

## 2013-01-13 VITALS — BP 161/100 | HR 106 | Resp 14 | Ht 68.0 in | Wt 204.6 lb

## 2013-01-13 DIAGNOSIS — M47816 Spondylosis without myelopathy or radiculopathy, lumbar region: Secondary | ICD-10-CM

## 2013-01-13 DIAGNOSIS — Q759 Congenital malformation of skull and face bones, unspecified: Secondary | ICD-10-CM

## 2013-01-13 DIAGNOSIS — G8929 Other chronic pain: Secondary | ICD-10-CM | POA: Insufficient documentation

## 2013-01-13 DIAGNOSIS — M545 Low back pain, unspecified: Secondary | ICD-10-CM | POA: Insufficient documentation

## 2013-01-13 DIAGNOSIS — M48061 Spinal stenosis, lumbar region without neurogenic claudication: Secondary | ICD-10-CM | POA: Insufficient documentation

## 2013-01-13 DIAGNOSIS — M47817 Spondylosis without myelopathy or radiculopathy, lumbosacral region: Secondary | ICD-10-CM

## 2013-01-13 DIAGNOSIS — M542 Cervicalgia: Secondary | ICD-10-CM | POA: Insufficient documentation

## 2013-01-13 DIAGNOSIS — M79609 Pain in unspecified limb: Secondary | ICD-10-CM | POA: Insufficient documentation

## 2013-01-13 MED ORDER — MORPHINE SULFATE ER 100 MG PO TBCR: 100.0000 mg | EXTENDED_RELEASE_TABLET | Freq: Two times a day (BID) | ORAL | Status: DC

## 2013-01-13 NOTE — Progress Notes (Signed)
Subjective:    Patient ID: Kenneth Farrell, male    DOB: Jun 08, 1971, 42 y.o.   MRN: [id number]  HPI  Kenneth Farrell is back regarding his chronic back and shoulder girdle pain. His pain has been under reasonable control. He has met a companion and is enjoying his life more. He is more active emotionally and physically.   His medications are working well for him. He uses celebrex on a prn basis for when his pain flares up. His pain levels tend to correlate with the amount of physical activity he pursues.   Pain Inventory Average Pain 5 Pain Right Now 6 My pain is sharp, burning, dull, stabbing and aching  In the last 24 hours, has pain interfered with the following? General activity 5 Relation with others 5 Enjoyment of life 5 What TIME of day is your pain at its worst? all Sleep (in general) Fair  Pain is worse with: walking, bending, sitting, inactivity, standing and some activites Pain improves with: rest, heat/ice, therapy/exercise, pacing activities and medication Relief from Meds: 5  Mobility walk without assistance how many minutes can you walk? 15 ability to climb steps?  yes do you drive?  yes  Function disabled: date disabled 74  Neuro/Psych spasms anxiety  Prior Studies Any changes since last visit?  no  Physicians involved in your care Any changes since last visit?  no   Family History  Problem Relation Age of Onset  . Hypertension Mother   . Hypertension Father    History   Social History  . Marital Status: Single    Spouse Name: N/A    Number of Children: N/A  . Years of Education: N/A   Social History Main Topics  . Smoking status: Former Smoker    Quit date: 06/27/2011  . Smokeless tobacco: Never Used  . Alcohol Use: None  . Drug Use: None  . Sexually Active: None   Other Topics Concern  . None   Social History Narrative  . None   Past Surgical History  Procedure Laterality Date  . Spine surgery    . Urinary tract surgery    . Hernia  repair     Past Medical History  Diagnosis Date  . Chronic pain syndrome   . Kyphoscoliosis and scoliosis   . Lumbago   . 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 Facet syndrome, lumbar   . Anxiety    BP 161/100  Pulse 106  Resp 14  Ht 5\' 8"  (1.727 m)  Wt 204 lb 9.6 oz (92.806 kg)  BMI 31.12 kg/m2  SpO2 98%    Review of Systems  Musculoskeletal: Positive for back pain.       Spasms  Psychiatric/Behavioral: The patient is nervous/anxious.   All other systems reviewed and are negative.       Objective:   Physical Exam  Constitutional: He appears well-developed and well-nourished.  HENT:  Head: Normocephalic and atraumatic.  Eyes: Conjunctivae and EOM are normal. Pupils are equal, round, and reactive to light.  Neck: Normal range of motion. Neck supple.  Cardiovascular: Normal rate and regular rhythm.  Pulmonary/Chest: Effort normal and breath sounds normal.  Abdominal: Soft. Bowel sounds are normal.  Musculoskeletal: Normal range of motion.  Pt with persistent limitations in the right low back in regards to ROM. Some pain with end ROM in the thoracic and lumbar spine. Baseline hypoplasia present in scapula and strength. He stands up fairly easily from a seated position. Gait is  normal.  Neurological: He is alert. He has normal strength and normal reflexes.  Psychiatric: He has a normal mood and affect. His behavior is normal. Judgment and thought content normal.    Assessment & Plan:   ASSESSMENT:  1. 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  2. Chronic low back pain.   PLAN:  1. I refilled his MS Contin 100 mg q.12 h. #60 with a second refill  for next month. Continue with exercise and appropriate posture and technique as we have discussed before. Kenneth Farrell continues to adjust well and the addition of his male friend to his life has obviously served him well.  2. Our PA  will see him back here in about 2 months' time. 3. I  would prefer him to continue using the celebrex on a PRN basis only.

## 2013-01-13 NOTE — Patient Instructions (Signed)
CALL ME WITH ANY PROBLEMS OR QUESTIONS (#[phone]).  HAVE A GOOD DAY  

## 2013-01-15 ENCOUNTER — Other Ambulatory Visit: Payer: Self-pay

## 2013-01-15 MED ORDER — ALPRAZOLAM 1 MG PO TABS: 1.0000 mg | ORAL_TABLET | Freq: Four times a day (QID) | ORAL | Status: DC

## 2013-01-15 MED ORDER — ZOLPIDEM TARTRATE 10 MG PO TABS: 10.0000 mg | ORAL_TABLET | Freq: Every day | ORAL | Status: DC

## 2013-03-10 ENCOUNTER — Encounter
Payer: PRIVATE HEALTH INSURANCE | Attending: Physical Medicine and Rehabilitation | Admitting: Physical Medicine and Rehabilitation

## 2013-03-10 ENCOUNTER — Encounter: Payer: Self-pay | Admitting: Physical Medicine and Rehabilitation

## 2013-03-10 VITALS — BP 150/94 | HR 101 | Resp 14 | Ht 68.0 in | Wt 203.0 lb

## 2013-03-10 DIAGNOSIS — M545 Low back pain, unspecified: Secondary | ICD-10-CM | POA: Insufficient documentation

## 2013-03-10 DIAGNOSIS — M48061 Spinal stenosis, lumbar region without neurogenic claudication: Secondary | ICD-10-CM | POA: Insufficient documentation

## 2013-03-10 DIAGNOSIS — G8929 Other chronic pain: Secondary | ICD-10-CM | POA: Insufficient documentation

## 2013-03-10 DIAGNOSIS — M47817 Spondylosis without myelopathy or radiculopathy, lumbosacral region: Secondary | ICD-10-CM

## 2013-03-10 DIAGNOSIS — M542 Cervicalgia: Secondary | ICD-10-CM | POA: Insufficient documentation

## 2013-03-10 DIAGNOSIS — M961 Postlaminectomy syndrome, not elsewhere classified: Secondary | ICD-10-CM

## 2013-03-10 DIAGNOSIS — Z981 Arthrodesis status: Secondary | ICD-10-CM | POA: Insufficient documentation

## 2013-03-10 DIAGNOSIS — Q759 Congenital malformation of skull and face bones, unspecified: Secondary | ICD-10-CM

## 2013-03-10 MED ORDER — MORPHINE SULFATE ER 100 MG PO TBCR: 100.0000 mg | EXTENDED_RELEASE_TABLET | Freq: Two times a day (BID) | ORAL | Status: DC

## 2013-03-10 MED ORDER — MORPHINE SULFATE ER 100 MG PO TBCR
100.0000 mg | EXTENDED_RELEASE_TABLET | Freq: Two times a day (BID) | ORAL | Status: DC
Start: 2013-03-10 — End: 2013-05-13

## 2013-03-10 NOTE — Progress Notes (Signed)
Subjective:    Patient ID: Kenneth Farrell, male    DOB: [birth year], 42 y.o.   MRN: [id number]  HPI The patient complains about chronic low back pain, which radiates into his LEs bilateral.Hx of PSF. The patient also complains about chronic neck pain .  The problem has been stable. The patient reports that he walks with his dog almost every day.  He also reports, that he has a girlfriend now, and that he is pretty happy with his life at this point.  Pain Inventory Average Pain 5 Pain Right Now 5 My pain is constant, sharp, dull, stabbing, tingling and aching  In the last 24 hours, has pain interfered with the following? General activity 5 Relation with others 4 Enjoyment of life 5 What TIME of day is your pain at its worst? constant Sleep (in general) Fair  Pain is worse with: walking, bending, inactivity, standing and some activites Pain improves with: rest, heat/ice, therapy/exercise, pacing activities and medication Relief from Meds: 5  Mobility how many minutes can you walk? 10-15 ability to climb steps?  yes do you drive?  yes  Function disabled: date disabled 63  Neuro/Psych spasms anxiety  Prior Studies Any changes since last visit?  no  Physicians involved in your care Any changes since last visit?  no   Family History  Problem Relation Age of Onset  . Hypertension Mother   . Hypertension Father    History   Social History  . Marital Status: Single    Spouse Name: N/A    Number of Children: N/A  . Years of Education: N/A   Social History Main Topics  . Smoking status: Former Smoker    Quit date: 06/27/2011  . Smokeless tobacco: Never Used  . Alcohol Use: None  . Drug Use: None  . Sexually Active: None   Other Topics Concern  . None   Social History Narrative  . None   Past Surgical History  Procedure Laterality Date  . Spine surgery    . Urinary tract surgery    . Hernia repair     Past Medical History  Diagnosis Date  . Chronic  pain syndrome   . Kyphoscoliosis and scoliosis   . Lumbago   . 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 Facet syndrome, lumbar   . Anxiety    BP 150/94  Pulse 101  Resp 14  Ht 5\' 8"  (1.727 m)  Wt 203 lb (92.08 kg)  BMI 30.87 kg/m2  SpO2 96%     Review of Systems  Psychiatric/Behavioral: The patient is nervous/anxious.   All other systems reviewed and are negative.       Objective:   Physical Exam Constitutional: He is oriented to person, place, and time. He appears well-developed and well-nourished.  HENT:  Hemifacial microsomia  Neck: Neck supple.  Musculoskeletal:  scoliosis  Neurological: He is alert and oriented to person, place, and time.  Skin: Skin is warm and dry.  Psychiatric: He has a normal mood and affect.  Symmetric normal motor tone is noted throughout. Normal muscle bulk. Muscle testing reveals 5/5 muscle strength of the upper extremity, and 5/5 of the lower extremity, except right quadriceps 4/5, . Full range of motion in upper and lower extremities. ROM of spine is restricted. Fine motor movements are normal in both hands.  DTR in the upper and lower extremity are present and symmetric 2+. No clonus is noted.  Patient arises from chair without difficulty. Narrow  based gait, spine forward flexed, with normal arm swing bilateral , able to walk on heels and toes . Tandem walk is stable.        Assessment & Plan:  This is a 42 year old male with  1.Goldenhar syndrome  2.Lumbar stenosis  3. Low back pain, radiating into his LE bilateral . 4. Neck pain , has gotten better after he pays more attention to his posture.  Plan : Continue with the medication. Continue with exercise and walking program. I also advised the patient to pay attention to his posture. I showed him some tricks to correct his posture. Advised patient to exercise and walk, which helps him to relief his symptoms and built up strength.  Follow up in 2 month .  Refilled patient's  MS Contin, and also gave him an additional prescription for the following month. Marland Kitchen

## 2013-03-10 NOTE — Patient Instructions (Signed)
Continue with your walking program. 

## 2013-03-19 ENCOUNTER — Other Ambulatory Visit: Payer: Self-pay | Admitting: Physical Medicine & Rehabilitation

## 2013-04-16 ENCOUNTER — Other Ambulatory Visit: Payer: Self-pay

## 2013-04-16 MED ORDER — NORTRIPTYLINE HCL 50 MG PO CAPS: 100.0000 mg | ORAL_CAPSULE | Freq: Every day | ORAL | Status: DC

## 2013-05-05 ENCOUNTER — Encounter: Payer: PRIVATE HEALTH INSURANCE | Admitting: Physical Medicine and Rehabilitation

## 2013-05-13 ENCOUNTER — Encounter: Payer: Self-pay | Admitting: Physical Medicine and Rehabilitation

## 2013-05-13 ENCOUNTER — Encounter
Payer: PRIVATE HEALTH INSURANCE | Attending: Physical Medicine and Rehabilitation | Admitting: Physical Medicine and Rehabilitation

## 2013-05-13 VITALS — BP 161/95 | HR 117 | Resp 16 | Ht 68.0 in | Wt 201.0 lb

## 2013-05-13 DIAGNOSIS — M542 Cervicalgia: Secondary | ICD-10-CM | POA: Insufficient documentation

## 2013-05-13 DIAGNOSIS — Z79899 Other long term (current) drug therapy: Secondary | ICD-10-CM | POA: Insufficient documentation

## 2013-05-13 DIAGNOSIS — M4120 Other idiopathic scoliosis, site unspecified: Secondary | ICD-10-CM | POA: Insufficient documentation

## 2013-05-13 DIAGNOSIS — M48061 Spinal stenosis, lumbar region without neurogenic claudication: Secondary | ICD-10-CM | POA: Insufficient documentation

## 2013-05-13 DIAGNOSIS — M545 Low back pain, unspecified: Secondary | ICD-10-CM | POA: Insufficient documentation

## 2013-05-13 DIAGNOSIS — Q759 Congenital malformation of skull and face bones, unspecified: Secondary | ICD-10-CM | POA: Insufficient documentation

## 2013-05-13 DIAGNOSIS — G8929 Other chronic pain: Secondary | ICD-10-CM | POA: Insufficient documentation

## 2013-05-13 DIAGNOSIS — M47817 Spondylosis without myelopathy or radiculopathy, lumbosacral region: Secondary | ICD-10-CM

## 2013-05-13 MED ORDER — MORPHINE SULFATE ER 100 MG PO TBCR
100.0000 mg | EXTENDED_RELEASE_TABLET | Freq: Two times a day (BID) | ORAL | Status: DC
Start: 2013-05-13 — End: 2013-07-14

## 2013-05-13 MED ORDER — MORPHINE SULFATE ER 100 MG PO TBCR: 100.0000 mg | EXTENDED_RELEASE_TABLET | Freq: Two times a day (BID) | ORAL | Status: DC

## 2013-05-13 NOTE — Progress Notes (Signed)
Subjective:    Patient ID: Kenneth Farrell, male    DOB: 23-Oct-1970, 42 y.o.   MRN: [id number]  HPI The patient complains about chronic low back pain, which radiates into his LEs bilateral.Hx of PSF. The patient also complains about chronic neck pain .  The problem has been stable. The patient reports that he walks with his dog almost every day.  He also reports, that he has a new girlfriend now, and that he is pretty happy with his life at this point.  Pain Inventory Average Pain 5 Pain Right Now 5 My pain is constant, sharp, burning, dull, stabbing, tingling and aching  In the last 24 hours, has pain interfered with the following? General activity 5 Relation with others 5 Enjoyment of life 5 What TIME of day is your pain at its worst? constant Sleep (in general) Fair  Pain is worse with: walking, bending, sitting, standing and some activites Pain improves with: rest, heat/ice, therapy/exercise, pacing activities and medication Relief from Meds: 5  Mobility how many minutes can you walk? 10-15 ability to climb steps?  yes do you drive?  yes  Function disabled: date disabled 13  Neuro/Psych anxiety  Prior Studies Any changes since last visit?  no  Physicians involved in your care Any changes since last visit?  no   Family History  Problem Relation Age of Onset  . Hypertension Mother   . Hypertension Father    History   Social History  . Marital Status: Single    Spouse Name: N/A    Number of Children: N/A  . Years of Education: N/A   Social History Main Topics  . Smoking status: Former Smoker    Quit date: 06/27/2011  . Smokeless tobacco: Never Used  . Alcohol Use: None  . Drug Use: None  . Sexually Active: None   Other Topics Concern  . None   Social History Narrative  . None   Past Surgical History  Procedure Laterality Date  . Spine surgery    . Urinary tract surgery    . Hernia repair     Past Medical History  Diagnosis Date  .  Chronic pain syndrome   . Kyphoscoliosis and scoliosis   . Lumbago   . 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 Facet syndrome, lumbar   . Anxiety    BP 161/95  Pulse 117  Resp 16  Ht 5\' 8"  (1.727 m)  Wt 201 lb (91.173 kg)  BMI 30.57 kg/m2  SpO2 98%     Review of Systems  HENT: Positive for neck pain.   Psychiatric/Behavioral: The patient is nervous/anxious.   All other systems reviewed and are negative.       Objective:   Physical Exam Constitutional: He is oriented to person, place, and time. He appears well-developed and well-nourished.  HENT:  Hemifacial microsomia  Neck: Neck supple.  Musculoskeletal:  scoliosis  Neurological: He is alert and oriented to person, place, and time.  Skin: Skin is warm and dry.  Psychiatric: He has a normal mood and affect.  Symmetric normal motor tone is noted throughout. Normal muscle bulk. Muscle testing reveals 5/5 muscle strength of the upper extremity, and 5/5 of the lower extremity, except right quadriceps 4/5, . Full range of motion in upper and lower extremities. ROM of spine is restricted. Fine motor movements are normal in both hands.  DTR in the upper and lower extremity are present and symmetric 2+. No clonus is noted.  Patient arises from chair without difficulty. Narrow based gait, spine forward flexed, with normal arm swing bilateral , able to walk on heels and toes . Tandem walk is stable.        Assessment & Plan:  This is a 42 year old male with  1.Goldenhar syndrome  2.Lumbar stenosis  3. Low back pain, radiating into his LE bilateral .  4. Neck pain , has gotten better after he pays more attention to his posture.  Plan : Continue with the medication. Continue with exercise and walking program. I also advised the patient to pay attention to his posture. I showed him some tricks to correct his posture. Advised patient to exercise and walk, which helps him to relief his symptoms and built up strength.   Follow up in 2 month .  Refilled patient's MS Contin, and also gave him an additional prescription for the following month. Marland Kitchen

## 2013-07-14 ENCOUNTER — Encounter
Payer: PRIVATE HEALTH INSURANCE | Attending: Physical Medicine and Rehabilitation | Admitting: Physical Medicine and Rehabilitation

## 2013-07-14 ENCOUNTER — Encounter: Payer: Self-pay | Admitting: Physical Medicine and Rehabilitation

## 2013-07-14 VITALS — BP 134/74 | HR 96 | Resp 14 | Ht 68.0 in | Wt 202.8 lb

## 2013-07-14 DIAGNOSIS — G8929 Other chronic pain: Secondary | ICD-10-CM | POA: Insufficient documentation

## 2013-07-14 DIAGNOSIS — M542 Cervicalgia: Secondary | ICD-10-CM | POA: Insufficient documentation

## 2013-07-14 DIAGNOSIS — M47817 Spondylosis without myelopathy or radiculopathy, lumbosacral region: Secondary | ICD-10-CM

## 2013-07-14 DIAGNOSIS — Q759 Congenital malformation of skull and face bones, unspecified: Secondary | ICD-10-CM | POA: Insufficient documentation

## 2013-07-14 DIAGNOSIS — M48061 Spinal stenosis, lumbar region without neurogenic claudication: Secondary | ICD-10-CM | POA: Insufficient documentation

## 2013-07-14 MED ORDER — ZOLPIDEM TARTRATE 10 MG PO TABS: ORAL_TABLET | ORAL | Status: DC

## 2013-07-14 MED ORDER — NORTRIPTYLINE HCL 50 MG PO CAPS: 100.0000 mg | ORAL_CAPSULE | Freq: Every day | ORAL | Status: DC

## 2013-07-14 MED ORDER — MORPHINE SULFATE ER 100 MG PO TBCR: 100.0000 mg | EXTENDED_RELEASE_TABLET | Freq: Two times a day (BID) | ORAL | Status: DC

## 2013-07-14 NOTE — Progress Notes (Signed)
Subjective:    Patient ID: Kenneth Farrell, male    DOB: Sep 06, 1971, 42 y.o.   MRN: [id number]  HPI The patient complains about chronic low back pain, which radiates into his LEs bilateral.Hx of PSF. The patient also complains about chronic neck pain .  The problem has been stable. The patient reports that he walks with his dog almost every day.  He also reports, that he has a new girlfriend now, and that he is pretty happy with his life at this point.  Pain Inventory Average Pain 6 Pain Right Now 5 My pain is constant, sharp, burning, dull, stabbing, tingling, aching and other  In the last 24 hours, has pain interfered with the following? General activity 5 Relation with others 6 Enjoyment of life 6 What TIME of day is your pain at its worst? all day Sleep (in general) Fair  Pain is worse with: walking, bending, sitting, inactivity, standing, unsure, some activites and other Pain improves with: rest, heat/ice, therapy/exercise, pacing activities and medication Relief from Meds: 5  Mobility walk without assistance how many minutes can you walk? 10 ability to climb steps?  yes do you drive?  yes  Function disabled: date disabled na  Neuro/Psych spasms anxiety  Prior Studies Any changes since last visit?  no  Physicians involved in your care Any changes since last visit?  no   Family History  Problem Relation Age of Onset  . Hypertension Mother   . Hypertension Father    History   Social History  . Marital Status: Single    Spouse Name: N/A    Number of Children: N/A  . Years of Education: N/A   Social History Main Topics  . Smoking status: Former Smoker    Quit date: 06/27/2011  . Smokeless tobacco: Never Used  . Alcohol Use: Not on file  . Drug Use: Not on file  . Sexual Activity: Not on file   Other Topics Concern  . Not on file   Social History Narrative  . No narrative on file   Past Surgical History  Procedure Laterality Date  . Spine  surgery    . Urinary tract surgery    . Hernia repair     Past Medical History  Diagnosis Date  . Chronic pain syndrome   . Kyphoscoliosis and scoliosis   . Lumbago   . 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 Facet syndrome, lumbar   . Anxiety    There were no vitals taken for this visit.     Review of Systems  Musculoskeletal: Positive for back pain.  Neurological:       Spasms  Psychiatric/Behavioral: The patient is nervous/anxious.        Objective:   Physical Exam Constitutional: He is oriented to person, place, and time. He appears well-developed and well-nourished.  HENT:  Hemifacial microsomia  Neck: Neck supple.  Musculoskeletal:  scoliosis  Neurological: He is alert and oriented to person, place, and time.  Skin: Skin is warm and dry.  Psychiatric: He has a normal mood and affect.  Symmetric normal motor tone is noted throughout. Normal muscle bulk. Muscle testing reveals 5/5 muscle strength of the upper extremity, and 5/5 of the lower extremity, except right quadriceps 4/5, . Full range of motion in upper and lower extremities. ROM of spine is restricted. Fine motor movements are normal in both hands.  DTR in the upper and lower extremity are present and symmetric 2+. No clonus is  noted.  Patient arises from chair without difficulty. Narrow based gait, spine forward flexed, with normal arm swing bilateral , able to walk on heels and toes . Tandem walk is stable.        Assessment & Plan:  This is a 42 year old male with  1.Goldenhar syndrome  2.Lumbar stenosis  3. Low back pain, radiating into his LE bilateral .  4. Neck pain , has gotten better after he pays more attention to his posture.  Plan : Continue with the medication. Continue with exercise and walking program. I also advised the patient to pay attention to his posture. I showed him some tricks to correct his posture. Advised patient to exercise and walk, which helps him to relief his  symptoms and built up strength.  Follow up in 2 month .  Refilled patient's MS Contin, and also gave him an additional prescription for the following month.

## 2013-07-14 NOTE — Patient Instructions (Signed)
Continue with your walking and exercise program

## 2013-07-16 ENCOUNTER — Other Ambulatory Visit: Payer: Self-pay

## 2013-07-16 MED ORDER — ALPRAZOLAM 1 MG PO TABS: ORAL_TABLET | ORAL | Status: DC

## 2013-07-16 NOTE — Telephone Encounter (Signed)
Refill of Alprazolam called in to [facility].

## 2013-09-08 ENCOUNTER — Encounter: Payer: PRIVATE HEALTH INSURANCE | Admitting: Physical Medicine and Rehabilitation

## 2013-09-08 ENCOUNTER — Other Ambulatory Visit: Payer: Self-pay | Admitting: *Deleted

## 2013-09-08 MED ORDER — MORPHINE SULFATE ER 100 MG PO TBCR: 100.0000 mg | EXTENDED_RELEASE_TABLET | Freq: Two times a day (BID) | ORAL | Status: DC

## 2013-09-08 NOTE — Telephone Encounter (Signed)
2 months rx printed early for controlled medication for the visit with RN on 09/15/13 (to be signed by MD)

## 2013-09-15 ENCOUNTER — Encounter: Payer: Self-pay | Admitting: *Deleted

## 2013-09-15 ENCOUNTER — Encounter: Payer: PRIVATE HEALTH INSURANCE | Attending: Physical Medicine & Rehabilitation | Admitting: *Deleted

## 2013-09-15 VITALS — BP 135/70 | HR 99 | Resp 14 | Ht 68.0 in | Wt 202.0 lb

## 2013-09-15 DIAGNOSIS — M48061 Spinal stenosis, lumbar region without neurogenic claudication: Secondary | ICD-10-CM | POA: Insufficient documentation

## 2013-09-15 DIAGNOSIS — M47816 Spondylosis without myelopathy or radiculopathy, lumbar region: Secondary | ICD-10-CM

## 2013-09-15 DIAGNOSIS — G8929 Other chronic pain: Secondary | ICD-10-CM | POA: Insufficient documentation

## 2013-09-15 DIAGNOSIS — Q759 Congenital malformation of skull and face bones, unspecified: Secondary | ICD-10-CM | POA: Insufficient documentation

## 2013-09-15 DIAGNOSIS — M542 Cervicalgia: Secondary | ICD-10-CM | POA: Insufficient documentation

## 2013-09-15 NOTE — Progress Notes (Signed)
Here for pill count and medication refills. MS CONTIN 100mg  bid # 60 Fill date  08/25/13   Today NV#30  Appropriate.  No changes in pain levels or medcaitions.  Has a new family practice MD  Dr Glennie Isle with [facility] in [place], [place] (moving there)

## 2013-09-15 NOTE — Patient Instructions (Signed)
Please schedule for 2 month follow up with RN for med management 

## 2013-10-15 ENCOUNTER — Other Ambulatory Visit: Payer: Self-pay

## 2013-10-15 MED ORDER — NORTRIPTYLINE HCL 50 MG PO CAPS: 100.0000 mg | ORAL_CAPSULE | Freq: Every day | ORAL | Status: DC

## 2013-11-10 ENCOUNTER — Encounter
Payer: PRIVATE HEALTH INSURANCE | Attending: Physical Medicine & Rehabilitation | Admitting: Physical Medicine & Rehabilitation

## 2013-11-10 ENCOUNTER — Encounter: Payer: Self-pay | Admitting: Physical Medicine & Rehabilitation

## 2013-11-10 ENCOUNTER — Encounter (INDEPENDENT_AMBULATORY_CARE_PROVIDER_SITE_OTHER): Payer: Self-pay

## 2013-11-10 VITALS — BP 151/97 | HR 105 | Resp 14 | Ht 68.0 in | Wt 210.0 lb

## 2013-11-10 DIAGNOSIS — G8929 Other chronic pain: Secondary | ICD-10-CM | POA: Insufficient documentation

## 2013-11-10 DIAGNOSIS — Q188 Other specified congenital malformations of face and neck: Secondary | ICD-10-CM | POA: Insufficient documentation

## 2013-11-10 DIAGNOSIS — Q870 Congenital malformation syndromes predominantly affecting facial appearance: Secondary | ICD-10-CM

## 2013-11-10 DIAGNOSIS — M47816 Spondylosis without myelopathy or radiculopathy, lumbar region: Secondary | ICD-10-CM

## 2013-11-10 DIAGNOSIS — M545 Low back pain, unspecified: Secondary | ICD-10-CM | POA: Insufficient documentation

## 2013-11-10 DIAGNOSIS — M4120 Other idiopathic scoliosis, site unspecified: Secondary | ICD-10-CM | POA: Insufficient documentation

## 2013-11-10 DIAGNOSIS — Q759 Congenital malformation of skull and face bones, unspecified: Secondary | ICD-10-CM | POA: Insufficient documentation

## 2013-11-10 DIAGNOSIS — M47817 Spondylosis without myelopathy or radiculopathy, lumbosacral region: Secondary | ICD-10-CM

## 2013-11-10 MED ORDER — MORPHINE SULFATE ER 100 MG PO TBCR: 100.0000 mg | EXTENDED_RELEASE_TABLET | Freq: Two times a day (BID) | ORAL | Status: DC

## 2013-11-10 MED ORDER — DICLOFENAC SODIUM 1 % TD GEL: 1.0000 "application " | Freq: Three times a day (TID) | TRANSDERMAL | Status: DC

## 2013-11-10 MED ORDER — LIDOCAINE 5 % EX PTCH: 1.0000 | MEDICATED_PATCH | CUTANEOUS | Status: DC

## 2013-11-10 NOTE — Patient Instructions (Signed)
PLEASE CALL ME WITH ANY PROBLEMS OR QUESTIONS (#[phone]).      

## 2013-11-10 NOTE — Progress Notes (Signed)
Subjective:    Patient ID: Kenneth Farrell, male    DOB: 25-Feb-1971, 43 y.o.   MRN: [id number]  HPI  Kenneth Farrell is back regarding his chronic pain. He has been doing well. He is about to be engaged! He plans to make it official soon.   His pain has remained steady although he's been more active as a whole. He attributes it to his relationship for the most part.   He is using senokot-s each night and typically has a bm each night.        Pain Inventory Average Pain 6 Pain Right Now 6 My pain is intermittent, constant, sharp, burning, dull, stabbing, tingling and aching  In the last 24 hours, has pain interfered with the following? General activity 6 Relation with others 6 Enjoyment of life 7 What TIME of day is your pain at its worst? all day Sleep (in general) Fair  Pain is worse with: walking, bending, sitting, inactivity, standing, unsure and some activites Pain improves with: rest, therapy/exercise, pacing activities and medication Relief from Meds: 5  Mobility walk without assistance how many minutes can you walk? 10-15 ability to climb steps?  yes do you drive?  yes  Function disabled: date disabled 40  Neuro/Psych spasms anxiety  Prior Studies Any changes since last visit?  no  Physicians involved in your care Any changes since last visit?  no   Family History  Problem Relation Age of Onset  . Hypertension Mother   . Hypertension Father    History   Social History  . Marital Status: Single    Spouse Name: N/A    Number of Children: N/A  . Years of Education: N/A   Social History Main Topics  . Smoking status: Former Smoker    Quit date: 06/27/2011  . Smokeless tobacco: Never Used  . Alcohol Use: None  . Drug Use: None  . Sexual Activity: None   Other Topics Concern  . None   Social History Narrative  . None   Past Surgical History  Procedure Laterality Date  . Spine surgery    . Urinary tract surgery    . Hernia repair     Past  Medical History  Diagnosis Date  . Chronic pain syndrome   . Kyphoscoliosis and scoliosis   . Lumbago   . 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 Facet syndrome, lumbar   . Anxiety    BP 151/97  Pulse 105  Resp 14  Ht 5\' 8"  (1.727 m)  Wt 210 lb (95.255 kg)  BMI 31.94 kg/m2  SpO2 96%  Opioid Risk Score: 2 Fall Risk Score: Low Fall Risk (0-5 points) (pt educated on fall risk, brochure given to pt.)    Review of Systems  Musculoskeletal: Positive for back pain.  Neurological:       Spasms  Psychiatric/Behavioral: The patient is nervous/anxious.   All other systems reviewed and are negative.       Objective:   Physical Exam Constitutional: He appears well-developed and well-nourished.  HENT:  Head: Normocephalic and atraumatic.  Eyes: Conjunctivae and EOM are normal. Pupils are equal, round, and reactive to light.  Neck: Normal range of motion. Neck supple.  Cardiovascular: Normal rate and regular rhythm.  Pulmonary/Chest: Effort normal and breath sounds normal.  Abdominal: Soft. Bowel sounds are normal.  Musculoskeletal: Normal range of motion.  Pt with persistent limitations in the right low back in regards to ROM. Some pain with end ROM  in the thoracic and lumbar spine. Baseline hypoplasia present in scapula and strength. He stands up fairly easily from a seated position. Gait is normal.  Neurological: He is alert. He has normal strength and normal reflexes.  Psychiatric: He has a normal mood and affect. His behavior is normal. Judgment and thought content normal.    Assessment & Plan:   ASSESSMENT:  1. 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  2. Chronic associated mid to low back pain.      PLAN:  1. I refilled his MS Contin 100 mg q.12 h. #60 with a second refill  for next month. He seems to be doing even better than last time. His soon-to-be fiancee has a lot to do with it I am sure! 2. Our PA will see him back  here in about 2 months' time.  3. Reviewed his bowel program again today.  Refilled voltaren gel and lidoderm patches today.

## 2013-11-12 ENCOUNTER — Other Ambulatory Visit: Payer: Self-pay

## 2013-11-12 MED ORDER — ALPRAZOLAM 1 MG PO TABS: ORAL_TABLET | ORAL | Status: DC

## 2013-11-15 ENCOUNTER — Other Ambulatory Visit: Payer: Self-pay

## 2013-11-15 MED ORDER — ZOLPIDEM TARTRATE 10 MG PO TABS: ORAL_TABLET | ORAL | Status: DC

## 2013-12-09 ENCOUNTER — Telehealth: Payer: Self-pay

## 2013-12-09 NOTE — Telephone Encounter (Signed)
Prior authorization for lidocaine was denied.  Called insurance to see what they cover, gabapentin and lyrica.  Would you like to change patient to one of these medications.  Patient needs a diagnosis of neuropathy or shingles to get medication approved.  Please advise.

## 2013-12-09 NOTE — Telephone Encounter (Signed)
He's been on this for years.  Will they approve lidocaine jelly?

## 2013-12-13 MED ORDER — LIDOCAINE 5 % EX OINT: 1.0000 "application " | TOPICAL_OINTMENT | Freq: Four times a day (QID) | CUTANEOUS | Status: DC | PRN

## 2013-12-13 NOTE — Telephone Encounter (Signed)
Apply qid to right chest wall, back. Disp #1 month supply. 4 RF

## 2013-12-13 NOTE — Telephone Encounter (Signed)
We can try the lidocaine jelly or ointment?  Please advise.

## 2013-12-13 NOTE — Telephone Encounter (Signed)
Lidocaine ointment sent to pharmacy in place of the patches since insurance did not approve medication.

## 2014-01-06 ENCOUNTER — Encounter: Payer: Self-pay | Admitting: Physical Medicine and Rehabilitation

## 2014-01-06 ENCOUNTER — Encounter: Payer: PRIVATE HEALTH INSURANCE | Attending: Physical Medicine and Rehabilitation | Admitting: Registered Nurse

## 2014-01-06 VITALS — BP 164/98 | HR 116 | Resp 14 | Ht 68.0 in | Wt 209.0 lb

## 2014-01-06 DIAGNOSIS — M47816 Spondylosis without myelopathy or radiculopathy, lumbar region: Secondary | ICD-10-CM

## 2014-01-06 DIAGNOSIS — G8929 Other chronic pain: Secondary | ICD-10-CM | POA: Insufficient documentation

## 2014-01-06 DIAGNOSIS — M549 Dorsalgia, unspecified: Secondary | ICD-10-CM | POA: Insufficient documentation

## 2014-01-06 DIAGNOSIS — M4120 Other idiopathic scoliosis, site unspecified: Secondary | ICD-10-CM | POA: Insufficient documentation

## 2014-01-06 DIAGNOSIS — M542 Cervicalgia: Secondary | ICD-10-CM | POA: Insufficient documentation

## 2014-01-06 DIAGNOSIS — F411 Generalized anxiety disorder: Secondary | ICD-10-CM | POA: Insufficient documentation

## 2014-01-06 DIAGNOSIS — Q870 Congenital malformation syndromes predominantly affecting facial appearance: Secondary | ICD-10-CM

## 2014-01-06 DIAGNOSIS — Q759 Congenital malformation of skull and face bones, unspecified: Secondary | ICD-10-CM

## 2014-01-06 DIAGNOSIS — M47817 Spondylosis without myelopathy or radiculopathy, lumbosacral region: Secondary | ICD-10-CM

## 2014-01-06 MED ORDER — METAXALONE 800 MG PO TABS: 800.0000 mg | ORAL_TABLET | Freq: Four times a day (QID) | ORAL | Status: DC | PRN

## 2014-01-06 MED ORDER — MORPHINE SULFATE ER 100 MG PO TBCR: 100.0000 mg | EXTENDED_RELEASE_TABLET | Freq: Two times a day (BID) | ORAL | Status: DC

## 2014-01-06 MED ORDER — NORTRIPTYLINE HCL 50 MG PO CAPS: 100.0000 mg | ORAL_CAPSULE | Freq: Every day | ORAL | Status: DC

## 2014-01-06 NOTE — Progress Notes (Signed)
Subjective:    Patient ID: Kenneth Farrell, male    DOB: 05-29-1971, 43 y.o.   MRN: [id number]  HPI:   [name] is a 43 year old male with a history of Posterior Spinal Fusion. He's euphoric,about getting married on 07/02/2014. He's leaving for [place] to visit his fiance' family in the morning. He will be taking frequent rest periods during the drive every two hours, and will be walking and stretching during his periods of rest. He's currently using walking and pilate's for his exercise regimen. He also does yard work and uses a Firefighterpush mower and walks his dog.  His Pain level today is 4/10. He's here for follow regarding his chronic back pain and medication refill.    Pain Inventory Average Pain 5 Pain Right Now 6 My pain is intermittent, constant, sharp, burning, dull, stabbing, tingling and aching  In the last 24 hours, has pain interfered with the following? General activity 6 Relation with others 7 Enjoyment of life 7 What TIME of day is your pain at its worst? constant Sleep (in general) Fair  Pain is worse with: walking, bending, sitting, inactivity, standing and some activites Pain improves with: rest, therapy/exercise, pacing activities and medication Relief from Meds: 4  Mobility walk without assistance how many minutes can you walk? 10-15 ability to climb steps?  yes do you drive?  yes  Function disabled: date disabled [id number]  Neuro/Psych spasms anxiety  Prior Studies Any changes since last visit?  no  Physicians involved in your care Any changes since last visit?  no   Family History  Problem Relation Age of Onset  . Hypertension Mother   . Hypertension Father    History   Social History  . Marital Status: Single    Spouse Name: N/A    Number of Children: N/A  . Years of Education: N/A   Social History Main Topics  . Smoking status: Former Smoker    Quit date: 06/27/2011  . Smokeless tobacco: Never Used  . Alcohol Use: None  .  Drug Use: None  . Sexual Activity: None   Other Topics Concern  . None   Social History Narrative  . None   Past Surgical History  Procedure Laterality Date  . Spine surgery    . Urinary tract surgery    . Hernia repair     Past Medical History  Diagnosis Date  . Chronic pain syndrome   . Kyphoscoliosis and scoliosis   . Lumbago   . 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 Facet syndrome, lumbar   . Anxiety    BP 164/98  Pulse 116  Resp 14  Ht 5\' 8"  (1.727 m)  Wt 209 lb (94.802 kg)  BMI 31.79 kg/m2  SpO2 99%  Opioid Risk Score:   Fall Risk Score: Low Fall Risk (0-5 points) (patient educated handout declined)    Review of Systems  Musculoskeletal: Positive for back pain and neck pain.  Psychiatric/Behavioral: The patient is nervous/anxious.   All other systems reviewed and are negative.       Objective:   Physical Exam  Constitutional: He is oriented to person, place, and time. He appears well-developed and well-nourished.  Eyes:  Face asymmetrical with right paresis.  Neck:  Cervical flexion and extension  with decrease ROM on the right.  Pulmonary/Chest: Effort normal.  Musculoskeletal:  Lumbar sacral region with kyphosis noted. ROM decreased to the right side.  Neurological: He is  alert and oriented to person, place, and time.  Skin:  Multiple healed old incisions on his back.. Left thoracotomy incision healed and he denies pain or hyposensitivity. Right donor site wound healed.  Psychiatric: He has a normal mood and affect. His speech is normal and behavior is normal. Judgment and thought content normal. Cognition and memory are normal.  Euphoric          Assessment & Plan:    1. 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   2. Chronic Back Pain:  MS Contin is effective for pain management and makes a great difference in his quality of Life. Continue using Skelaxin for muscle spasm and Pamelor for neuropathy. Also  recommended alternating heat and ice for shoulder pain.  Refilled: MS Contin 100mg  #60,---use one BID. Given RX for additional refill for next month.

## 2014-01-06 NOTE — Patient Instructions (Signed)
Continue to exercise to promote strengthening. Recommend alternating using ice and heat.

## 2014-02-11 ENCOUNTER — Telehealth: Payer: Self-pay

## 2014-02-11 NOTE — Telephone Encounter (Signed)
What? Those are all anti-inflammatories!!!! i am rx'ing a muscle relaxant

## 2014-02-11 NOTE — Telephone Encounter (Signed)
Prior authorization resubmitted. 

## 2014-02-11 NOTE — Telephone Encounter (Signed)
[facility] was denied because patient needs to first try ALL of the following: Diclofenac potassium, Diflunisal, Etodolac, and Ketoprofen. Please advise.

## 2014-03-04 ENCOUNTER — Other Ambulatory Visit: Payer: Self-pay

## 2014-03-04 MED ORDER — ALPRAZOLAM 1 MG PO TABS: ORAL_TABLET | ORAL | Status: DC

## 2014-03-09 ENCOUNTER — Ambulatory Visit: Payer: PRIVATE HEALTH INSURANCE | Admitting: Registered Nurse

## 2014-03-10 ENCOUNTER — Encounter: Payer: PRIVATE HEALTH INSURANCE | Attending: Physical Medicine and Rehabilitation | Admitting: Registered Nurse

## 2014-03-10 ENCOUNTER — Encounter: Payer: Self-pay | Admitting: Registered Nurse

## 2014-03-10 VITALS — BP 148/77 | HR 122 | Resp 14 | Ht 68.0 in | Wt 210.0 lb

## 2014-03-10 DIAGNOSIS — M47817 Spondylosis without myelopathy or radiculopathy, lumbosacral region: Secondary | ICD-10-CM | POA: Insufficient documentation

## 2014-03-10 DIAGNOSIS — M47816 Spondylosis without myelopathy or radiculopathy, lumbar region: Secondary | ICD-10-CM

## 2014-03-10 DIAGNOSIS — Q870 Congenital malformation syndromes predominantly affecting facial appearance: Secondary | ICD-10-CM

## 2014-03-10 DIAGNOSIS — Z5181 Encounter for therapeutic drug level monitoring: Secondary | ICD-10-CM

## 2014-03-10 DIAGNOSIS — Z79899 Other long term (current) drug therapy: Secondary | ICD-10-CM

## 2014-03-10 DIAGNOSIS — Q759 Congenital malformation of skull and face bones, unspecified: Secondary | ICD-10-CM | POA: Insufficient documentation

## 2014-03-10 MED ORDER — ZOLPIDEM TARTRATE 10 MG PO TABS: ORAL_TABLET | ORAL | Status: DC

## 2014-03-10 MED ORDER — MORPHINE SULFATE ER 100 MG PO TBCR: 100.0000 mg | EXTENDED_RELEASE_TABLET | Freq: Two times a day (BID) | ORAL | Status: DC

## 2014-03-10 NOTE — Progress Notes (Signed)
Subjective:    Patient ID: Kenneth Farrell, male    DOB: Sep 06, 1971, 43 y.o.   MRN: [id number]  HPI: Kenneth Farrell is a 43 year old male who returns for follow up for chronic pain and medication refill. He says his pain is located in his shoulder blades, bilateral hips, lower back and right leg laterally. He rates his pain 5. His current exercise regime is walking 3-4 times a day, yard work and he will be starting bicycling 3-4 times a week and hopefull starting pool therapy. He went [place] and had a great time meeting his fiancee family.    Pain Inventory Average Pain 5 Pain Right Now 5 My pain is constant, sharp, burning, dull, stabbing, tingling and aching  In the last 24 hours, has pain interfered with the following? General activity 4 Relation with others 4 Enjoyment of life 5 What TIME of day is your pain at its worst? constant all day Sleep (in general) Fair  Pain is worse with: walking, bending, sitting, inactivity, standing, unsure and some activites Pain improves with: rest, heat/ice, therapy/exercise, pacing activities and medication Relief from Meds: 6  Mobility walk without assistance how many minutes can you walk? 10-15 ability to climb steps?  yes do you drive?  yes transfers alone  Function disabled: date disabled 591993  Neuro/Psych spasms anxiety  Prior Studies Any changes since last visit?  no  Physicians involved in your care Any changes since last visit?  no   Family History  Problem Relation Age of Onset  . Hypertension Mother   . Hypertension Father    History   Social History  . Marital Status: Single    Spouse Name: N/A    Number of Children: N/A  . Years of Education: N/A   Social History Main Topics  . Smoking status: Former Smoker    Quit date: 06/27/2011  . Smokeless tobacco: Never Used  . Alcohol Use: None  . Drug Use: None  . Sexual Activity: None   Other Topics Concern  . None   Social History  Narrative  . None   Past Surgical History  Procedure Laterality Date  . Spine surgery    . Urinary tract surgery    . Hernia repair     Past Medical History  Diagnosis Date  . Chronic pain syndrome   . Kyphoscoliosis and scoliosis   . Lumbago   . 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 Facet syndrome, lumbar   . Anxiety    BP 148/77  Pulse 122  Resp 14  Ht 5\' 8"  (1.727 m)  Wt 210 lb (95.255 kg)  BMI 31.94 kg/m2  SpO2 97%  Opioid Risk Score:   Fall Risk Score: Low Fall Risk (0-5 points) (pt educated on fall risk, brochure given to pt previously)   Review of Systems  Musculoskeletal: Positive for back pain.  Neurological:       Spasms  Psychiatric/Behavioral: The patient is nervous/anxious.   All other systems reviewed and are negative.      Objective:   Physical Exam  Nursing note and vitals reviewed. Constitutional: He appears well-developed and well-nourished.  HENT:  Face asymmetrical with right paresis  Neck:  Cervical flexion and extension with decreased ROM on the right side.  Cardiovascular: Normal rate and regular rhythm.   Pulmonary/Chest: Effort normal and breath sounds normal.  Musculoskeletal:  Normal Muscle Bulk and Muscle testing Reveals: Upper Extremities: Full ROM  and Muscle Strength 5/5. Thoracic Paraspinal Tenderness Noted: T-10- T-12 Kyphosis noted Lumbar Paraspinal Tenderness Noted: L-3- L-5 Lower Extremities: Full ROM and Muscle Strength 5/5 Arises from chair with ease Narrow Based gait          Assessment & Plan:  1. 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  2. Chronic Back Pain:Continue using Skelaxin for muscle spasm and Pamelor for neuropathy. Also recommended alternating heat and ice for back pain.  Refilled: MS Contin 100mg  #60,---use one BID. Given RX for additional refill for next month.   20 minutes of face to face patient care time was spent during this visit. All questions were  encouraged and answered.  F/U in 2 months

## 2014-04-12 ENCOUNTER — Other Ambulatory Visit: Payer: Self-pay | Admitting: Registered Nurse

## 2014-05-05 ENCOUNTER — Other Ambulatory Visit: Payer: Self-pay

## 2014-05-05 MED ORDER — ALPRAZOLAM 1 MG PO TABS: ORAL_TABLET | ORAL | Status: DC

## 2014-05-12 ENCOUNTER — Encounter: Payer: PRIVATE HEALTH INSURANCE | Attending: Physical Medicine and Rehabilitation | Admitting: Registered Nurse

## 2014-05-12 ENCOUNTER — Encounter: Payer: Self-pay | Admitting: Registered Nurse

## 2014-05-12 VITALS — BP 143/92 | HR 114 | Resp 14 | Ht 68.0 in | Wt 210.0 lb

## 2014-05-12 DIAGNOSIS — M47817 Spondylosis without myelopathy or radiculopathy, lumbosacral region: Secondary | ICD-10-CM | POA: Diagnosis present

## 2014-05-12 DIAGNOSIS — Z5181 Encounter for therapeutic drug level monitoring: Secondary | ICD-10-CM

## 2014-05-12 DIAGNOSIS — Q759 Congenital malformation of skull and face bones, unspecified: Secondary | ICD-10-CM | POA: Diagnosis present

## 2014-05-12 DIAGNOSIS — Z79899 Other long term (current) drug therapy: Secondary | ICD-10-CM

## 2014-05-12 DIAGNOSIS — M47816 Spondylosis without myelopathy or radiculopathy, lumbar region: Secondary | ICD-10-CM

## 2014-05-12 DIAGNOSIS — Q870 Congenital malformation syndromes predominantly affecting facial appearance: Secondary | ICD-10-CM

## 2014-05-12 MED ORDER — MORPHINE SULFATE ER 100 MG PO TBCR: 100.0000 mg | EXTENDED_RELEASE_TABLET | Freq: Two times a day (BID) | ORAL | Status: DC

## 2014-05-12 NOTE — Progress Notes (Signed)
Subjective:    Patient ID: Kenneth Farrell, male    DOB: [birth year], 43 y.o.   MRN: [id number]  HPI: Kenneth Farrell is a 43 year old male with a history of Posterior Spinal Fusion.He returns for follow up for his chronic pain and medication refill. His current exercise regime is bicycling daily a block and a half.  He's getting married on 07/02/2014.  Pain Inventory Average Pain 5 Pain Right Now 6 My pain is intermittent, constant, sharp, burning, dull, stabbing, tingling and aching  In the last 24 hours, has pain interfered with the following? General activity 5 Relation with others 7 Enjoyment of life 6 What TIME of day is your pain at its worst? constant all day Sleep (in general) Fair  Pain is worse with: walking, bending, sitting, inactivity, standing, unsure and some activites Pain improves with: rest, heat/ice, therapy/exercise, pacing activities and medication Relief from Meds: 5  Mobility walk without assistance how many minutes can you walk? 10-15 ability to climb steps?  yes do you drive?  yes transfers alone  Function disabled: date disabled [id number]  Neuro/Psych spasms anxiety  Prior Studies Any changes since last visit?  no  Physicians involved in your care Any changes since last visit?  no   Family History  Problem Relation Age of Onset  . Hypertension Mother   . Hypertension Father    History   Social History  . Marital Status: Single    Spouse Name: N/A    Number of Children: N/A  . Years of Education: N/A   Social History Main Topics  . Smoking status: Former Smoker    Quit date: 06/27/2011  . Smokeless tobacco: Never Used  . Alcohol Use: None  . Drug Use: None  . Sexual Activity: None   Other Topics Concern  . None   Social History Narrative  . None   Past Surgical History  Procedure Laterality Date  . Spine surgery    . Urinary tract surgery    . Hernia repair     Past Medical History  Diagnosis Date  . Chronic pain  syndrome   . Kyphoscoliosis and scoliosis   . Lumbago   . 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 Facet syndrome, lumbar   . Anxiety    BP 143/92  Pulse 114  Resp 14  Ht 5\' 8"  (1.727 m)  Wt 210 lb (95.255 kg)  BMI 31.94 kg/m2  SpO2 96%  Opioid Risk Score:   Fall Risk Score: Low Fall Risk (0-5 points) (pt educated on fall risk, brochure given to pt previously)    Review of Systems  Musculoskeletal: Positive for back pain.  Neurological:       Spasms  Psychiatric/Behavioral: The patient is nervous/anxious.   All other systems reviewed and are negative.      Objective:   Physical Exam  Nursing note and vitals reviewed. Constitutional: He appears well-developed and well-nourished.  HENT:  Head: Normocephalic and atraumatic.  Neck: Normal range of motion. Neck supple.  Cardiovascular: Normal rate and regular rhythm.   Pulmonary/Chest: Effort normal and breath sounds normal.  Musculoskeletal:  Normal Muscle Bulk and Muscle testing Reveals: Upper Extremities: Full ROM and Muscle strength 5/5 Spinal Forward Flexion 45 Degrees and Extension 20 Degrees Left Kyphosis Noted Lumbar Paraspinal Tenderness: L-3- L-5 Lower extremities: Full ROM and Muscle strength 5/5 Right Leg Flexion Produces Pain to right Hip. Arises from chair with ease Narrow Based Gait Narrow Based gait  Neurological: He is alert.  Skin: Skin is warm and dry.  Psychiatric: He has a normal mood and affect.          Assessment & Plan:  1. 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  2. Chronic Back Pain: MS Contin is effective for pain management and makes a great difference in his quality of Life. Continue using Skelaxin for muscle spasm and Pamelor for neuropathy. Also recommended alternating heat and ice for shoulder pain.  Refilled: MS Contin 100mg  #60,---use one BID. Given RX for additional refill for next month.   20 minutes of face to face patient care time was  spent during this visit. All questions was encouraged and answered.  F/U in 2 months

## 2014-07-09 ENCOUNTER — Other Ambulatory Visit: Payer: Self-pay | Admitting: Registered Nurse

## 2014-07-12 ENCOUNTER — Other Ambulatory Visit: Payer: Self-pay | Admitting: *Deleted

## 2014-07-12 MED ORDER — ZOLPIDEM TARTRATE 10 MG PO TABS: ORAL_TABLET | ORAL | Status: DC

## 2014-07-12 NOTE — Telephone Encounter (Signed)
recvd fax from [facility] to refill Zolpidem 10mg  - take once tab at bedtime.  Will call it in.

## 2014-07-14 ENCOUNTER — Encounter: Payer: Self-pay | Admitting: Registered Nurse

## 2014-07-14 ENCOUNTER — Encounter: Payer: PRIVATE HEALTH INSURANCE | Attending: Physical Medicine and Rehabilitation | Admitting: Registered Nurse

## 2014-07-14 VITALS — BP 146/86 | HR 68 | Resp 14 | Ht 68.0 in | Wt 225.0 lb

## 2014-07-14 DIAGNOSIS — M47816 Spondylosis without myelopathy or radiculopathy, lumbar region: Secondary | ICD-10-CM | POA: Insufficient documentation

## 2014-07-14 DIAGNOSIS — Q870 Congenital malformation syndromes predominantly affecting facial appearance: Secondary | ICD-10-CM | POA: Diagnosis present

## 2014-07-14 DIAGNOSIS — Z5181 Encounter for therapeutic drug level monitoring: Secondary | ICD-10-CM | POA: Insufficient documentation

## 2014-07-14 DIAGNOSIS — Z79899 Other long term (current) drug therapy: Secondary | ICD-10-CM | POA: Diagnosis present

## 2014-07-14 DIAGNOSIS — G894 Chronic pain syndrome: Secondary | ICD-10-CM | POA: Insufficient documentation

## 2014-07-14 MED ORDER — MORPHINE SULFATE ER 100 MG PO TBCR: 100.0000 mg | EXTENDED_RELEASE_TABLET | Freq: Two times a day (BID) | ORAL | Status: DC

## 2014-07-14 NOTE — Progress Notes (Signed)
Subjective:    Patient ID: Kenneth Farrell, male    DOB: Jan 30, 1971, 43 y.o.   MRN: [id number]  HPI: [name] is a 43 year old male who returns for follow up for his chronic pain and medication refill. He says his pain is located in his neck, and lower back, the pain radiates from his lower back laterally into his right leg. He rates his pain 6.His usual exercise regime is riding his bicycling daily a block and a half with planning his wedding and the weather he hasn't been able to ride. He will resume riding his bicycle this week. He was married on 07/02/2014.  Pain Inventory Average Pain 5 Pain Right Now 6 My pain is constant, sharp, burning, dull, stabbing, tingling and aching  In the last 24 hours, has pain interfered with the following? General activity 6 Relation with others 5 Enjoyment of life 6 What TIME of day is your pain at its worst? All Sleep (in general) Fair  Pain is worse with: walking, bending, sitting, inactivity, standing, unsure and some activites Pain improves with: rest, heat/ice, therapy/exercise, pacing activities and medication Relief from Meds: 4  Mobility walk without assistance  Function disabled: date disabled .  Neuro/Psych spasms anxiety  Prior Studies Any changes since last visit?  no  Physicians involved in your care Any changes since last visit?  no   Family History  Problem Relation Age of Onset  . Hypertension Mother   . Hypertension Father    History   Social History  . Marital Status: Single    Spouse Name: N/A    Number of Children: N/A  . Years of Education: N/A   Social History Main Topics  . Smoking status: Former Smoker    Quit date: 06/27/2011  . Smokeless tobacco: Never Used  . Alcohol Use: None  . Drug Use: None  . Sexual Activity: None   Other Topics Concern  . None   Social History Narrative  . None   Past Surgical History  Procedure Laterality Date  . Spine surgery    . Urinary tract surgery     . Hernia repair     Past Medical History  Diagnosis Date  . Chronic pain syndrome   . Kyphoscoliosis and scoliosis   . Lumbago   . 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 Facet syndrome, lumbar   . Anxiety    BP 146/86  Pulse 68  Resp 14  Ht 5\' 8"  (1.727 m)  Wt 225 lb (102.059 kg)  BMI 34.22 kg/m2  SpO2 97%  Opioid Risk Score:   Fall Risk Score: Low Fall Risk (0-5 points)   Review of Systems     Objective:   Physical Exam  Nursing note and vitals reviewed. Constitutional: He is oriented to person, place, and time. He appears well-developed and well-nourished.  HENT:  Head: Normocephalic and atraumatic.  Neck: Normal range of motion. Neck supple.  Cervical Paraspinal Tenderness: C-6- C-7  Cardiovascular: Normal rate and regular rhythm.   Pulmonary/Chest: Effort normal and breath sounds normal.  Musculoskeletal:  Normal Muscle Bulk and Muscle Testing Reveals: Upper Extremities: Full ROM and Muscle strength 5/5 Left Kyphosis Noted Lumbar Paraspinal Tenderness: L-3- L-5 Lower Extremities: Full ROM and Muscle Strength 5/5 Arises from chair with ease Narrow based gait  Neurological: He is alert and oriented to person, place, and time.  Skin: Skin is warm and dry.  Psychiatric: He has a normal mood and  affect.          Assessment & Plan:  1. 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  2. Chronic Back Pain: MS Contin is effective for pain management and makes a great difference in his quality of Life. Continue using Skelaxin for muscle spasm and Pamelor for neuropathy. Also recommended alternating heat and ice for shoulder pain.  Refilled: MS Contin 100mg  #60,---use one BID. Given RX for additional refill for next month.  20 minutes of face to face patient care time was spent during this visit. All questions was encouraged and answered.   F/U in 2 months

## 2014-08-31 ENCOUNTER — Other Ambulatory Visit: Payer: Self-pay | Admitting: *Deleted

## 2014-08-31 MED ORDER — ALPRAZOLAM 1 MG PO TABS: ORAL_TABLET | ORAL | Status: DC

## 2014-08-31 NOTE — Telephone Encounter (Signed)
Called in rx - Xanax 1.0 mg #120 RF # QID

## 2014-09-08 ENCOUNTER — Encounter: Payer: PRIVATE HEALTH INSURANCE | Attending: Physical Medicine and Rehabilitation | Admitting: Registered Nurse

## 2014-09-08 ENCOUNTER — Encounter: Payer: Self-pay | Admitting: Registered Nurse

## 2014-09-08 VITALS — BP 125/76 | HR 118 | Resp 14 | Ht 68.0 in | Wt 230.0 lb

## 2014-09-08 DIAGNOSIS — M47816 Spondylosis without myelopathy or radiculopathy, lumbar region: Secondary | ICD-10-CM | POA: Insufficient documentation

## 2014-09-08 DIAGNOSIS — Z5181 Encounter for therapeutic drug level monitoring: Secondary | ICD-10-CM | POA: Insufficient documentation

## 2014-09-08 DIAGNOSIS — Z79899 Other long term (current) drug therapy: Secondary | ICD-10-CM

## 2014-09-08 DIAGNOSIS — G894 Chronic pain syndrome: Secondary | ICD-10-CM | POA: Diagnosis present

## 2014-09-08 DIAGNOSIS — Q870 Congenital malformation syndromes predominantly affecting facial appearance: Secondary | ICD-10-CM | POA: Diagnosis present

## 2014-09-08 MED ORDER — MORPHINE SULFATE ER 100 MG PO TBCR: 100.0000 mg | EXTENDED_RELEASE_TABLET | Freq: Two times a day (BID) | ORAL | Status: DC

## 2014-09-08 NOTE — Progress Notes (Signed)
Subjective:    Patient ID: Kenneth Farrell, male    DOB: 05-21-1971, 43 y.o.   MRN: [id number]  HPI:Kenneth Farrell is a 43 year old male who returns for follow up for his chronic pain and medication refill. He says his pain is located in his neck, lower back, bilateral hips and right leg. He rates his pain 7.His usual exercise regime is performing stretching exercises and walking his dog daily. Arrived tachycardic 118 apical pulse checked 100.  Pain Inventory Average Pain 7 Pain Right Now 7 My pain is intermittent, constant, sharp, burning, dull, stabbing, tingling and aching  In the last 24 hours, has pain interfered with the following? General activity 6 Relation with others 7 Enjoyment of life 7 What TIME of day is your pain at its worst? morning, daytime, evening, night Sleep (in general) Fair  Pain is worse with: walking, bending, sitting, inactivity, standing and some activites Pain improves with: rest, heat/ice, therapy/exercise, pacing activities and medication Relief from Meds: 4  Mobility walk without assistance how many minutes can you walk? 10-15 ability to climb steps?  yes do you drive?  yes  Function disabled: date disabled [id number]  Neuro/Psych spasms anxiety  Prior Studies Any changes since last visit?  no  Physicians involved in your care Any changes since last visit?  no   Family History  Problem Relation Age of Onset  . Hypertension Mother   . Hypertension Father    History   Social History  . Marital Status: Single    Spouse Name: N/A    Number of Children: N/A  . Years of Education: N/A   Social History Main Topics  . Smoking status: Former Smoker    Quit date: 06/27/2011  . Smokeless tobacco: Never Used  . Alcohol Use: None  . Drug Use: None  . Sexual Activity: None   Other Topics Concern  . None   Social History Narrative   Past Surgical History  Procedure Laterality Date  . Spine surgery    . Urinary tract surgery    .  Hernia repair     Past Medical History  Diagnosis Date  . Chronic pain syndrome   . Kyphoscoliosis and scoliosis   . Lumbago   . 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 Facet syndrome, lumbar   . Anxiety    BP 125/76 mmHg  Pulse 118  Resp 14  Ht 5\' 8"  (1.727 m)  Wt 230 lb (104.327 kg)  BMI 34.98 kg/m2  SpO2 95%  Opioid Risk Score:   Fall Risk Score: Low Fall Risk (0-5 points) Review of Systems  Neurological:       Spasms  Psychiatric/Behavioral: The patient is nervous/anxious.   All other systems reviewed and are negative.      Objective:   Physical Exam  Constitutional: He is oriented to person, place, and time. He appears well-developed and well-nourished.  HENT:  Head: Normocephalic and atraumatic.  Neck: Normal range of motion. Neck supple.  Cardiovascular: Normal rate and regular rhythm.   Pulmonary/Chest: Effort normal and breath sounds normal.  Musculoskeletal:  Normal Muscle Bulk and Muscle testing Reveals: Upper extremities: Full ROM and Muscle strength 5/5 Thoracic Paraspinal Tenderness: T-5- T-7 Lumbar Paraspinal Tenderness: L-3- L-5 Left Kyphosis Noted Lower Extremities: Full ROM and Muscle strength 5/5 Arises from chair with ease Narrow Based gait  Neurological: He is alert and oriented to person, place, and time.  Skin: Skin is warm and dry.  Psychiatric: He has a normal mood and affect.  Nursing note and vitals reviewed.         Assessment & Plan:  1. 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  2. Chronic Back Pain: MS Contin is effective for pain management and makes a great difference in his quality of Life. Continue using Skelaxin for muscle spasm and Pamelor for neuropathy. Also recommended alternating heat and ice for shoulder pain.  Refilled: MS Contin 100mg  #60,---use one BID. Given RX for additional refill for next month.  20 minutes of face to face patient care time was spent during this visit. All  questions was encouraged and answered.   F/U in 2 months

## 2014-09-09 ENCOUNTER — Other Ambulatory Visit: Payer: Self-pay | Admitting: Physical Medicine & Rehabilitation

## 2014-09-10 LAB — PMP ALCOHOL METABOLITE (ETG)

## 2014-09-16 LAB — BENZODIAZEPINES (GC/LC/MS), URINE
ALPRAZOLAMU: 380 ng/mL — AB (ref <25)
Clonazepam metabolite (GC/LC/MS), ur confirm: NEGATIVE ng/mL (ref <25)
Flurazepam metabolite (GC/LC/MS), ur confirm: NEGATIVE ng/mL (ref <50)
Lorazepam (GC/LC/MS), ur confirm: NEGATIVE ng/mL (ref <50)
Midazolam (GC/LC/MS), ur confirm: NEGATIVE ng/mL (ref <50)
Nordiazepam (GC/LC/MS), ur confirm: NEGATIVE ng/mL (ref <50)
Oxazepam (GC/LC/MS), ur confirm: NEGATIVE ng/mL (ref <50)
TEMAZEPAMU: NEGATIVE ng/mL (ref <50)
Triazolam metabolite (GC/LC/MS), ur confirm: NEGATIVE ng/mL (ref <50)

## 2014-09-16 LAB — OPIATES/OPIOIDS (LC/MS-MS)
CODEINE URINE: NEGATIVE ng/mL (ref <50)
Hydrocodone: NEGATIVE ng/mL (ref <50)
Hydromorphone: 125 ng/mL (ref <50)
MORPHINE: 38654 ng/mL (ref <50)
Norhydrocodone, Ur: NEGATIVE ng/mL (ref <50)
Noroxycodone, Ur: NEGATIVE ng/mL (ref <50)
Oxycodone, ur: NEGATIVE ng/mL (ref <50)
Oxymorphone: NEGATIVE ng/mL (ref <50)

## 2014-09-16 LAB — ETHYL GLUCURONIDE, URINE
ETGU: 60024 ng/mL — AB (ref <500)
ETHYL SULFATE (ETS): 8967 ng/mL — AB (ref <100)

## 2014-09-16 LAB — ZOLPIDEM (LC/MS-MS), URINE
Zolpidem (GC/LC/MS), Ur confirm: 5 ng/mL — AB (ref <5)
Zolpidem metabolite (GC/LC/MS) Ur, confirm: 324 ng/mL — AB (ref <5)

## 2014-09-17 LAB — PRESCRIPTION MONITORING PROFILE (SOLSTAS)
Amphetamine/Meth: NEGATIVE ng/mL
BUPRENORPHINE, URINE: NEGATIVE ng/mL
Barbiturate Screen, Urine: NEGATIVE ng/mL
CREATININE, URINE: 54.93 mg/dL (ref >20.0)
Cannabinoid Scrn, Ur: NEGATIVE ng/mL
Carisoprodol, Urine: NEGATIVE ng/mL
Cocaine Metabolites: NEGATIVE ng/mL
FENTANYL URINE: NEGATIVE ng/mL
MDMA URINE: NEGATIVE ng/mL
METHADONE SCREEN, URINE: NEGATIVE ng/mL
Meperidine, Ur: NEGATIVE ng/mL
NITRITES URINE, INITIAL: NEGATIVE ug/mL
OXYCODONE SCRN UR: NEGATIVE ng/mL
PROPOXYPHENE: NEGATIVE ng/mL
Tapentadol, urine: NEGATIVE ng/mL
Tramadol Scrn, Ur: NEGATIVE ng/mL
pH, Initial: 6.9 pH (ref 4.5–8.9)

## 2014-09-21 NOTE — Progress Notes (Addendum)
Urine drug screen for this encounter is consistent for prescribed medications. Addended note: this test was also positive for high level of alcohol.

## 2014-10-10 ENCOUNTER — Other Ambulatory Visit: Payer: Self-pay | Admitting: Registered Nurse

## 2014-10-26 ENCOUNTER — Other Ambulatory Visit: Payer: Self-pay | Admitting: *Deleted

## 2014-10-26 MED ORDER — ZOLPIDEM TARTRATE 10 MG PO TABS: ORAL_TABLET | ORAL | Status: DC

## 2014-10-26 NOTE — Telephone Encounter (Signed)
recvd fax refill request - will call it in

## 2014-11-07 ENCOUNTER — Encounter: Payer: Medicare Other | Admitting: Physical Medicine & Rehabilitation

## 2014-11-09 ENCOUNTER — Encounter: Payer: Medicare Other | Attending: Physical Medicine and Rehabilitation | Admitting: Registered Nurse

## 2014-11-09 ENCOUNTER — Encounter: Payer: Self-pay | Admitting: Registered Nurse

## 2014-11-09 VITALS — BP 125/78 | HR 109 | Resp 14

## 2014-11-09 DIAGNOSIS — G894 Chronic pain syndrome: Secondary | ICD-10-CM | POA: Diagnosis present

## 2014-11-09 DIAGNOSIS — Z79899 Other long term (current) drug therapy: Secondary | ICD-10-CM

## 2014-11-09 DIAGNOSIS — Z5181 Encounter for therapeutic drug level monitoring: Secondary | ICD-10-CM | POA: Diagnosis present

## 2014-11-09 DIAGNOSIS — M47816 Spondylosis without myelopathy or radiculopathy, lumbar region: Secondary | ICD-10-CM | POA: Diagnosis present

## 2014-11-09 DIAGNOSIS — Q870 Congenital malformation syndromes predominantly affecting facial appearance: Secondary | ICD-10-CM | POA: Insufficient documentation

## 2014-11-09 MED ORDER — MORPHINE SULFATE ER 100 MG PO TBCR: 100.0000 mg | EXTENDED_RELEASE_TABLET | Freq: Two times a day (BID) | ORAL | Status: DC

## 2014-11-09 NOTE — Progress Notes (Signed)
Subjective:    Patient ID: Kenneth Farrell, male    DOB: 01/12/1971, 44 y.o.   MRN: [id number]  HPI: [name] is a 44 year old male who returns for follow up for his chronic pain and medication refill. He says his pain is located in his lower back. He rates his pain 7.His current exercise regime is walking.   Pain Inventory Average Pain 6 Pain Right Now 7 My pain is intermittent, constant, sharp, burning, dull, stabbing, tingling and aching  In the last 24 hours, has pain interfered with the following? General activity 5 Relation with others 6 Enjoyment of life 6 What TIME of day is your pain at its worst? all Sleep (in general) Fair  Pain is worse with: walking, bending, sitting, inactivity, standing and some activites Pain improves with: rest, heat/ice, therapy/exercise, pacing activities and medication Relief from Meds: 5  Mobility walk without assistance how many minutes can you walk? 10-15 ability to climb steps?  yes do you drive?  yes  Function disabled: date disabled 31  Neuro/Psych spasms anxiety  Prior Studies Any changes since last visit?  no  Physicians involved in your care Any changes since last visit?  no   Family History  Problem Relation Age of Onset  . Hypertension Mother   . Hypertension Father    History   Social History  . Marital Status: Single    Spouse Name: N/A    Number of Children: N/A  . Years of Education: N/A   Social History Main Topics  . Smoking status: Former Smoker    Quit date: 06/27/2011  . Smokeless tobacco: Never Used  . Alcohol Use: None  . Drug Use: None  . Sexual Activity: None   Other Topics Concern  . None   Social History Narrative   Past Surgical History  Procedure Laterality Date  . Spine surgery    . Urinary tract surgery    . Hernia repair     Past Medical History  Diagnosis Date  . Chronic pain syndrome   . Kyphoscoliosis and scoliosis   . Lumbago   . Congenital anomalies of  skull and face bones   . Facet syndrome, lumbar   . Anxiety    BP 125/78 mmHg  Pulse 109  Resp 14  SpO2 92%  Opioid Risk Score:   Fall Risk Score: Low Fall Risk (0-5 points) (previously educated and given handout)   Review of Systems  Musculoskeletal:       Spasms  Psychiatric/Behavioral: Positive for dysphoric mood.  All other systems reviewed and are negative.      Objective:   Physical Exam  Constitutional: He is oriented to person, place, and time. He appears well-developed and well-nourished.  HENT:  Head: Normocephalic and atraumatic.  Neck: Normal range of motion. Neck supple.  Cardiovascular: Normal rate and regular rhythm.   Pulmonary/Chest: Effort normal and breath sounds normal.  Musculoskeletal:  Normal Muscle Bulk and Muscle Testing Reveals: Upper Extremities: Full ROM and Muscle Strength 5/5 Thoracic Paraspinal Tenderness: T-5- T-7 Lumbar Paraspinal Tenderness: L-3- L-5 Lower Extremities: Full ROM and Muscle strength 5/5 Arises from chair with Ease Narrow Based Gait  Neurological: He is alert and oriented to person, place, and time.  Skin: Skin is warm and dry.  Psychiatric: He has a normal mood and affect.  Nursing note and vitals reviewed.         Assessment & Plan:  1. History of Goldenhar syndrome with scoliosis and  right paresis.  2. Chronic Back Pain: MS Contin is effective for pain management and makes a great difference in his quality of Life. Continue using Skelaxin for muscle spasm and Pamelor for neuropathy. Also recommended alternating heat and ice for shoulder pain.  Refilled: MS Contin  #60,---use one BID. Given RX for additional refill for next month.  20 minutes of face to face patient care time was spent during this visit. All questions was encouraged and answered.   F/U in 2 months

## 2014-12-21 ENCOUNTER — Other Ambulatory Visit: Payer: Self-pay | Admitting: *Deleted

## 2014-12-21 MED ORDER — ALPRAZOLAM 1 MG PO TABS: ORAL_TABLET | ORAL | Status: DC

## 2014-12-21 NOTE — Telephone Encounter (Signed)
Ok to refill Xanax 1.0 mg tablets #120 #RF 2 - Take 1 tablet QID PRN for anxiety.  Faxed back to pharmacy.Marland KitchenMarland Kitchen

## 2015-01-04 ENCOUNTER — Encounter: Payer: Medicare Other | Attending: Physical Medicine and Rehabilitation | Admitting: Registered Nurse

## 2015-01-04 ENCOUNTER — Ambulatory Visit: Payer: Medicare Other | Admitting: Physical Medicine & Rehabilitation

## 2015-01-04 ENCOUNTER — Encounter: Payer: Self-pay | Admitting: Registered Nurse

## 2015-01-04 VITALS — BP 144/81 | HR 105 | Resp 14

## 2015-01-04 DIAGNOSIS — Z79899 Other long term (current) drug therapy: Secondary | ICD-10-CM

## 2015-01-04 DIAGNOSIS — G894 Chronic pain syndrome: Secondary | ICD-10-CM | POA: Diagnosis present

## 2015-01-04 DIAGNOSIS — Q870 Congenital malformation syndromes predominantly affecting facial appearance: Secondary | ICD-10-CM

## 2015-01-04 DIAGNOSIS — Z5181 Encounter for therapeutic drug level monitoring: Secondary | ICD-10-CM | POA: Diagnosis present

## 2015-01-04 DIAGNOSIS — M47816 Spondylosis without myelopathy or radiculopathy, lumbar region: Secondary | ICD-10-CM

## 2015-01-04 MED ORDER — MORPHINE SULFATE ER 100 MG PO TBCR: 100.0000 mg | EXTENDED_RELEASE_TABLET | Freq: Two times a day (BID) | ORAL | Status: DC

## 2015-01-04 NOTE — Progress Notes (Signed)
Subjective:    Patient ID: Kenneth Farrell, male    DOB: Feb 16, 1971, 43 y.o.   MRN: [id number]  HPI: [name] is a 44 year old male who returns for follow up for his chronic pain and medication refill. He says his pain is located in his lower back and right hip. He rates his pain 5. His current exercise regime is walking. Arrived tachycardic 105 apical pulse 100.  Pain Inventory Average Pain 7 Pain Right Now 5 My pain is intermittent, constant, sharp, burning, dull, stabbing, tingling and aching  In the last 24 hours, has pain interfered with the following? General activity 5 Relation with others 7 Enjoyment of life 7 What TIME of day is your pain at its worst? all Sleep (in general) Fair  Pain is worse with: walking, bending, sitting, inactivity, standing, unsure and some activites Pain improves with: rest, heat/ice, therapy/exercise, pacing activities and medication Relief from Meds: 5  Mobility walk without assistance how many minutes can you walk? 10 ability to climb steps?  yes do you drive?  yes  Function disabled: date disabled 25  Neuro/Psych spasms anxiety  Prior Studies Any changes since last visit?  no  Physicians involved in your care Any changes since last visit?  no   Family History  Problem Relation Age of Onset  . Hypertension Mother   . Hypertension Father    History   Social History  . Marital Status: Single    Spouse Name: N/A  . Number of Children: N/A  . Years of Education: N/A   Social History Main Topics  . Smoking status: Former Smoker    Quit date: 06/27/2011  . Smokeless tobacco: Never Used  . Alcohol Use: Not on file  . Drug Use: Not on file  . Sexual Activity: Not on file   Other Topics Concern  . None   Social History Narrative   Past Surgical History  Procedure Laterality Date  . Spine surgery    . Urinary tract surgery    . Hernia repair     Past Medical History  Diagnosis Date  . Chronic pain  syndrome   . Kyphoscoliosis and scoliosis   . Lumbago   . 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 Facet syndrome, lumbar   . Anxiety    BP 144/81 mmHg  Pulse 105  Resp 14  SpO2 95%  Opioid Risk Score:   Fall Risk Score: Low Fall Risk (0-5 points)`1  Depression screen PHQ 2/9  Score = 1  No flowsheet data found.   Review of Systems  Neurological:       Spasms  Psychiatric/Behavioral: The patient is nervous/anxious.   All other systems reviewed and are negative.      Objective:   Physical Exam  Constitutional: He is oriented to person, place, and time. He appears well-developed and well-nourished.  HENT:  Head: Normocephalic and atraumatic.  Neck: Normal range of motion. Neck supple.  Cardiovascular: Normal rate and regular rhythm.   Pulmonary/Chest: Effort normal and breath sounds normal.  Musculoskeletal:  Normal Muscle Bulk and Muscle Testing Reveals: Upper Extremities: Full ROM and Muscle Strength 5/5 Thoracic Paraspinal Tenderness: T-5- T-7 Lumbar Paraspinal Tenderness: L-3-L-5 Lower Extremities: Full ROM and Muscle Strength 5/5 Arises from chair with ease Narrow Based Gait   Neurological: He is alert and oriented to person, place, and time.  Skin: Skin is warm and dry.  Psychiatric: He has a normal mood and affect.  Nursing note and vitals reviewed.         Assessment & Plan:  1. 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  2. Chronic Back Pain: MS Contin is effective for pain management and makes a great difference in his quality of Life. Continue using Skelaxin for muscle spasm and Pamelor for neuropathy. Also recommended alternating heat and ice for shoulder pain.  Refilled: MS Contin  #60,---use one BID. Given RX for additional refill for next month.  20 minutes of face to face patient care time was spent during this visit. All questions was encouraged and answered.   F/U in 2 months

## 2015-01-05 ENCOUNTER — Other Ambulatory Visit: Payer: Self-pay | Admitting: Registered Nurse

## 2015-02-08 ENCOUNTER — Other Ambulatory Visit: Payer: Self-pay | Admitting: *Deleted

## 2015-02-08 MED ORDER — ZOLPIDEM TARTRATE 10 MG PO TABS: ORAL_TABLET | ORAL | Status: DC

## 2015-03-03 ENCOUNTER — Ambulatory Visit: Payer: Medicare Other | Admitting: Physical Medicine & Rehabilitation

## 2015-03-15 ENCOUNTER — Other Ambulatory Visit: Payer: Self-pay | Admitting: Registered Nurse

## 2015-03-15 ENCOUNTER — Encounter: Payer: Medicare Other | Attending: Physical Medicine and Rehabilitation | Admitting: Registered Nurse

## 2015-03-15 ENCOUNTER — Encounter: Payer: Self-pay | Admitting: Registered Nurse

## 2015-03-15 DIAGNOSIS — G894 Chronic pain syndrome: Secondary | ICD-10-CM | POA: Insufficient documentation

## 2015-03-15 DIAGNOSIS — Q870 Congenital malformation syndromes predominantly affecting facial appearance: Secondary | ICD-10-CM

## 2015-03-15 DIAGNOSIS — Z5181 Encounter for therapeutic drug level monitoring: Secondary | ICD-10-CM | POA: Insufficient documentation

## 2015-03-15 DIAGNOSIS — M47816 Spondylosis without myelopathy or radiculopathy, lumbar region: Secondary | ICD-10-CM | POA: Insufficient documentation

## 2015-03-15 DIAGNOSIS — Z79899 Other long term (current) drug therapy: Secondary | ICD-10-CM | POA: Diagnosis present

## 2015-03-15 MED ORDER — MORPHINE SULFATE ER 100 MG PO TBCR: 100.0000 mg | EXTENDED_RELEASE_TABLET | Freq: Two times a day (BID) | ORAL | Status: DC

## 2015-03-15 NOTE — Progress Notes (Signed)
Subjective:    Patient ID: Kenneth Farrell, male    DOB: 1971/08/25, 44 y.o.   MRN: [id number]  HPI: Kenneth Farrell is a 44 year old male who returns for follow up for his chronic pain and medication refill. He says his pain is located in his neck, lower back radiating into right hip and lower extremities laterally. He rates his pain 5. His current exercise regime is walking, riding bicycle, and [employer] three times a week.   Pain Inventory Average Pain 7 Pain Right Now 6 My pain is intermittent, constant, sharp, burning, dull, stabbing, tingling and aching  In the last 24 hours, has pain interfered with the following? General activity 7 Relation with others 7 Enjoyment of life 7 What TIME of day is your pain at its worst? all Sleep (in general) Fair  Pain is worse with: walking, bending, sitting, inactivity, standing and some activites Pain improves with: rest, heat/ice, therapy/exercise, pacing activities and medication Relief from Meds: 4  Mobility walk without assistance how many minutes can you walk? 10-15 ability to climb steps?  yes do you drive?  yes  Function disabled: date disabled 61  Neuro/Psych spasms anxiety  Prior Studies Any changes since last visit?  no  Physicians involved in your care Any changes since last visit?  no   Family History  Problem Relation Age of Onset  . Hypertension Mother   . Hypertension Father    History   Social History  . Marital Status: Single    Spouse Name: N/A  . Number of Children: N/A  . Years of Education: N/A   Social History Main Topics  . Smoking status: Former Smoker    Quit date: 06/27/2011  . Smokeless tobacco: Never Used  . Alcohol Use: Not on file  . Drug Use: Not on file  . Sexual Activity: Not on file   Other Topics Concern  . None   Social History Narrative   Past Surgical History  Procedure Laterality Date  . Spine surgery    . Urinary tract surgery    . Hernia repair      Past Medical History  Diagnosis Date  . Chronic pain syndrome   . Kyphoscoliosis and scoliosis   . Lumbago   . 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 Facet syndrome, lumbar   . Anxiety    There were no vitals taken for this visit.  Opioid Risk Score:   Fall Risk Score: Low Fall Risk (0-5 points)`1  Depression screen PHQ 2/9  Depression screen PHQ 2/9 01/04/2015  Decreased Interest 0  Down, Depressed, Hopeless 0  PHQ - 2 Score 0  Altered sleeping 0  Tired, decreased energy 0  Change in appetite 1  Feeling bad or failure about yourself  0  Trouble concentrating 0  Moving slowly or fidgety/restless 0  Suicidal thoughts 0  PHQ-9 Score 1    Review of Systems  Musculoskeletal:       Spasms  Psychiatric/Behavioral: The patient is nervous/anxious.   All other systems reviewed and are negative.      Objective:   Physical Exam  Constitutional: He is oriented to person, place, and time. He appears well-developed and well-nourished.  HENT:  Head: Normocephalic and atraumatic.  Neck: Normal range of motion. Neck supple.  Cervical Paraspinal Tenderness: C5- C-6 Mainly Right Side  Cardiovascular: Normal rate and regular rhythm.   Pulmonary/Chest: Effort normal and breath sounds normal.  Musculoskeletal:  Normal Muscle  Bulk and Muscle Testing Reveals: Upper Extremities: Full ROM and Muscle Strength 5/5 Right AC Joint Tenderness Thoracic Paraspinal Tenderness: T-11_T-12 Lumbar Paraspinal Tenderness: L-1- L-5 Lower Extremities: Full ROM and Muscle Strength 5/5 Arises from chair with ease Narrow Based Gait    Neurological: He is alert and oriented to person, place, and time.  Skin: Skin is warm and dry.  Psychiatric: He has a normal mood and affect.  Nursing note and vitals reviewed.         Assessment & Plan:  1. 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  2. Chronic Back Pain: MS Contin is effective for pain management and makes  a great difference in his quality of Life. Continue using Skelaxin for muscle spasm and Pamelor for neuropathy. Also recommended alternating heat and ice for shoulder pain.  Refilled: MS Contin  #60,---use one BID. Given RX for additional refill for next month.  20 minutes of face to face patient care time was spent during this visit. All questions was encouraged and answered.   F/U in 2 months

## 2015-03-16 LAB — PMP ALCOHOL METABOLITE (ETG)

## 2015-03-17 ENCOUNTER — Other Ambulatory Visit: Payer: Self-pay | Admitting: *Deleted

## 2015-03-17 MED ORDER — ALPRAZOLAM 1 MG PO TABS: ORAL_TABLET | ORAL | Status: DC

## 2015-03-17 NOTE — Telephone Encounter (Signed)
Recd faxed request to refill Xanax 1mg  tablets - Sig: Take 1 tablet four times daily as needed for anxiety, #RF 2.  Called into pharmacy.

## 2015-03-20 LAB — OPIATES/OPIOIDS (LC/MS-MS)
CODEINE URINE: NEGATIVE ng/mL (ref <50)
HYDROMORPHONE: NEGATIVE ng/mL — AB (ref <50)
Hydrocodone: NEGATIVE ng/mL (ref <50)
NOROXYCODONE, UR: NEGATIVE ng/mL (ref <50)
Norhydrocodone, Ur: NEGATIVE ng/mL (ref <50)
OXYCODONE, UR: NEGATIVE ng/mL (ref <50)
Oxymorphone: NEGATIVE ng/mL (ref <50)

## 2015-03-20 LAB — BENZODIAZEPINES (GC/LC/MS), URINE
ALPRAZOLAMU: 151 ng/mL (ref <25)
Clonazepam metabolite (GC/LC/MS), ur confirm: NEGATIVE ng/mL (ref <25)
Flurazepam metabolite (GC/LC/MS), ur confirm: NEGATIVE ng/mL (ref <50)
Lorazepam (GC/LC/MS), ur confirm: NEGATIVE ng/mL (ref <50)
Midazolam (GC/LC/MS), ur confirm: NEGATIVE ng/mL (ref <50)
NORDIAZEPAMU: NEGATIVE ng/mL (ref <50)
OXAZEPAMU: NEGATIVE ng/mL (ref <50)
Temazepam (GC/LC/MS), ur confirm: NEGATIVE ng/mL (ref <50)
Triazolam metabolite (GC/LC/MS), ur confirm: NEGATIVE ng/mL (ref <50)

## 2015-03-20 LAB — ETHYL GLUCURONIDE, URINE
Ethyl Glucuronide (EtG): 100000 ng/mL — ABNORMAL HIGH (ref <500)
Ethyl Sulfate (ETS): 10270 ng/mL — ABNORMAL HIGH (ref <100)

## 2015-03-20 LAB — ZOLPIDEM (LC/MS-MS), URINE
ZOLPIDEM (GC/LC/MS), UR CONFIRM: NEGATIVE ng/mL — AB (ref <5)
Zolpidem metabolite (GC/LC/MS) Ur, confirm: 339 ng/mL (ref <5)

## 2015-03-21 LAB — PRESCRIPTION MONITORING PROFILE (SOLSTAS)
AMPHETAMINE/METH: NEGATIVE ng/mL
BARBITURATE SCREEN, URINE: NEGATIVE ng/mL
BUPRENORPHINE, URINE: NEGATIVE ng/mL
CANNABINOID SCRN UR: NEGATIVE ng/mL
CARISOPRODOL, URINE: NEGATIVE ng/mL
Cocaine Metabolites: NEGATIVE ng/mL
Creatinine, Urine: 48.2 mg/dL (ref >20.0)
FENTANYL URINE: NEGATIVE ng/mL
MDMA URINE: NEGATIVE ng/mL
Meperidine, Ur: NEGATIVE ng/mL
Methadone Screen, Urine: NEGATIVE ng/mL
Nitrites, Initial: NEGATIVE ug/mL
Oxycodone Screen, Ur: NEGATIVE ng/mL
Propoxyphene: NEGATIVE ng/mL
Tapentadol, urine: NEGATIVE ng/mL
Tramadol Scrn, Ur: NEGATIVE ng/mL
pH, Initial: 7.1 pH (ref 4.5–8.9)

## 2015-03-22 NOTE — Progress Notes (Addendum)
Urine drug screen for this encounter is consistent for prescribed medication.  Inconsistent for being positive for ETOH.  In reviewing past tests for signs of alcohol I found that I missed the positive test on 09/10/15 and 07/14/13.  This would make three positive UDS for high levels of ETOH.  I have gone back and addended the 09/10/15 to reflect consistent for meds but positive for ETOH. He had a positive 07/14/13 and was to be counseled by Su Monks PA. After discussing with Dr Riley Kill he will be issued a formal warning about combining alcohol with narcotics and will be discharged if found to be positive again. Written formal warning will be issued by mail, and a copy of [facility] pamphlet included.

## 2015-04-06 ENCOUNTER — Other Ambulatory Visit: Payer: Self-pay | Admitting: Registered Nurse

## 2015-05-01 ENCOUNTER — Encounter: Payer: Medicare Other | Attending: Physical Medicine and Rehabilitation | Admitting: Registered Nurse

## 2015-05-01 ENCOUNTER — Encounter: Payer: Self-pay | Admitting: Registered Nurse

## 2015-05-01 VITALS — BP 136/99 | HR 100 | Resp 14

## 2015-05-01 DIAGNOSIS — Q870 Congenital malformation syndromes predominantly affecting facial appearance: Secondary | ICD-10-CM | POA: Insufficient documentation

## 2015-05-01 DIAGNOSIS — M47816 Spondylosis without myelopathy or radiculopathy, lumbar region: Secondary | ICD-10-CM | POA: Diagnosis present

## 2015-05-01 DIAGNOSIS — Z79899 Other long term (current) drug therapy: Secondary | ICD-10-CM | POA: Diagnosis not present

## 2015-05-01 DIAGNOSIS — G894 Chronic pain syndrome: Secondary | ICD-10-CM | POA: Diagnosis present

## 2015-05-01 DIAGNOSIS — Z5181 Encounter for therapeutic drug level monitoring: Secondary | ICD-10-CM | POA: Diagnosis present

## 2015-05-01 MED ORDER — MORPHINE SULFATE ER 100 MG PO TBCR: 100.0000 mg | EXTENDED_RELEASE_TABLET | Freq: Two times a day (BID) | ORAL | Status: DC

## 2015-05-01 NOTE — Progress Notes (Signed)
Subjective:    Patient ID: Kenneth Farrell, male    DOB: 07/24/1971, 44 y.o.   MRN: [id number]  HPI: [name] is a 44 year old male who returns for follow up for his chronic pain and medication refill. He says his pain is located in his neck, lower back, right hip and right leg. He rates his pain 5. His current exercise regime is walking. 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  We discussed the  inconsistent UDS with ETOH present. Educated on the narcotic policy he verbalizes understanding. Letter given to patient. Wife in room all questions answered.  Pain Inventory Average Pain 6 Pain Right Now 5 My pain is intermittent, constant, sharp, dull, stabbing, tingling and aching  In the last 24 hours, has pain interfered with the following? General activity 5 Relation with others 5 Enjoyment of life 5 What TIME of day is your pain at its worst? ALL Sleep (in general) Fair  Pain is worse with: walking, bending, sitting, inactivity, standing, unsure and some activites Pain improves with: rest, heat/ice, therapy/exercise, pacing activities and medication Relief from Meds: 5  Mobility walk without assistance how many minutes can you walk? 10-15 ability to climb steps?  yes do you drive?  yes  Function disabled: date disabled 56  Neuro/Psych spasms anxiety  Prior Studies Any changes since last visit?  no  Physicians involved in your care Any changes since last visit?  no   Family History  Problem Relation Age of Onset  . Hypertension Mother   . Hypertension Father    History   Social History  . Marital Status: Single    Spouse Name: N/A  . Number of Children: N/A  . Years of Education: N/A   Social History Main Topics  . Smoking status: Former Smoker    Quit date: 06/27/2011  . Smokeless tobacco: Never Used  . Alcohol Use: Not on file  . Drug Use: Not on file  . Sexual Activity: Not on file   Other Topics Concern  . None     Social History Narrative   Past Surgical History  Procedure Laterality Date  . Spine surgery    . Urinary tract surgery    . Hernia repair     Past Medical History  Diagnosis Date  . Chronic pain syndrome   . Kyphoscoliosis and scoliosis   . Lumbago   . 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 Facet syndrome, lumbar   . Anxiety    BP 144/84 mmHg  Pulse 108  Resp 14  SpO2 96%  Opioid Risk Score:   Fall Risk Score:  `1  Depression screen PHQ 2/9  Depression screen [facility] 2/9 05/01/2015 01/04/2015  Decreased Interest 0 0  Down, Depressed, Hopeless 0 0  PHQ - 2 Score 0 0  Altered sleeping - 0  Tired, decreased energy - 0  Change in appetite - 1  Feeling bad or failure about yourself  - 0  Trouble concentrating - 0  Moving slowly or fidgety/restless - 0  Suicidal thoughts - 0  PHQ-9 Score - 1     Review of Systems  Constitutional: Negative.   HENT: Negative.   Eyes: Negative.   Respiratory: Negative.   Cardiovascular: Negative.   Gastrointestinal: Negative.   Endocrine: Negative.   Genitourinary: Negative.   Musculoskeletal: Positive for myalgias, back pain and arthralgias.  Skin: Negative.   Allergic/Immunologic: Negative.   Neurological:  Spasms  Hematological: Negative.   Psychiatric/Behavioral: The patient is nervous/anxious.        Objective:   Physical Exam  Constitutional: He is oriented to person, place, and time. He appears well-developed and well-nourished.  HENT:  Head: Normocephalic and atraumatic.  Neck: Normal range of motion. Neck supple.  Cardiovascular: Normal rate and regular rhythm.   Pulmonary/Chest: Effort normal and breath sounds normal.  Musculoskeletal:  Normal Muscle Bulk and Muscle Testing Reveals:  Upper Extremities: Full ROM and Muscle Strength 5/5 Lumbar Paraspinal Tenderness: L-3- L-5 Lower Extremities: Full ROM and Muscle Strength 5/5 Arises from chair with ease Narrow Based gait  Neurological: He is  alert and oriented to person, place, and time.  Skin: Skin is warm and dry.  Psychiatric: He has a normal mood and affect.  Nursing note and vitals reviewed.         Assessment & Plan:  1. 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  2. Chronic Back Pain: MS Contin is effective for pain management and makes a great difference in his quality of Life. Continue using Skelaxin for muscle spasm and Pamelor for neuropathy. Also recommended alternating heat and ice for shoulder pain.  Refilled: MS Contin  #60,---use one BID.   20 minutes of face to face patient care time was spent during this visit. All questions was encouraged and answered.   F/U in 2 months

## 2015-05-09 ENCOUNTER — Ambulatory Visit: Payer: Medicare Other | Admitting: Registered Nurse

## 2015-05-10 ENCOUNTER — Ambulatory Visit: Payer: Medicare Other | Admitting: Physical Medicine & Rehabilitation

## 2015-05-17 ENCOUNTER — Other Ambulatory Visit: Payer: Self-pay | Admitting: Registered Nurse

## 2015-05-18 ENCOUNTER — Telehealth: Payer: Self-pay | Admitting: Registered Nurse

## 2015-05-18 NOTE — Telephone Encounter (Signed)
Kenneth Farrell return the call, The request from pharmacy in regards to Morphine was a mistake.

## 2015-05-18 NOTE — Telephone Encounter (Signed)
Placed a call to Kenneth Farrell regarding Pharmacy request. Will await his return call.

## 2015-06-13 ENCOUNTER — Encounter: Payer: Self-pay | Admitting: Physical Medicine & Rehabilitation

## 2015-06-13 ENCOUNTER — Encounter: Payer: Medicare Other | Attending: Physical Medicine and Rehabilitation | Admitting: Physical Medicine & Rehabilitation

## 2015-06-13 VITALS — BP 166/95 | HR 97

## 2015-06-13 DIAGNOSIS — Z79899 Other long term (current) drug therapy: Secondary | ICD-10-CM | POA: Diagnosis not present

## 2015-06-13 DIAGNOSIS — M47816 Spondylosis without myelopathy or radiculopathy, lumbar region: Secondary | ICD-10-CM | POA: Diagnosis not present

## 2015-06-13 DIAGNOSIS — Z5181 Encounter for therapeutic drug level monitoring: Secondary | ICD-10-CM | POA: Diagnosis present

## 2015-06-13 DIAGNOSIS — G894 Chronic pain syndrome: Secondary | ICD-10-CM | POA: Diagnosis present

## 2015-06-13 DIAGNOSIS — Q870 Congenital malformation syndromes predominantly affecting facial appearance: Secondary | ICD-10-CM | POA: Insufficient documentation

## 2015-06-13 MED ORDER — ZOLPIDEM TARTRATE 10 MG PO TABS: ORAL_TABLET | ORAL | Status: DC

## 2015-06-13 MED ORDER — MORPHINE SULFATE ER 100 MG PO TBCR: 100.0000 mg | EXTENDED_RELEASE_TABLET | Freq: Two times a day (BID) | ORAL | Status: DC

## 2015-06-13 NOTE — Patient Instructions (Signed)
PLEASE CALL ME WITH ANY PROBLEMS OR QUESTIONS (#[phone]).  HAVE A GOOD DAY!    

## 2015-06-13 NOTE — Progress Notes (Signed)
Subjective:    Patient ID: ASHE GAGO, male    DOB: 1970/11/08, 44 y.o.   MRN: [id number]  HPI   Mr Shomaker is here in follow up of his chronic pain. He has gotten married since I last saw him. 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 This has been going quite well for him. 25 hours is all he can handle.   He is doing a little walking with his job and at home. He finds the walking helps with his trunk and generalized pain. When he stands tall, his pain is better also.   He continues on his morphine CR 100mg  q12.   Pain Inventory Average Pain 5 Pain Right Now 5 My pain is constant, sharp, burning, dull, stabbing, tingling and aching  In the last 24 hours, has pain interfered with the following? General activity 5 Relation with others 5 Enjoyment of life 5 What TIME of day is your pain at its worst? all Sleep (in general) NA  Pain is worse with: walking, bending, sitting, inactivity, standing and some activites Pain improves with: rest, heat/ice, therapy/exercise, pacing activities and medication Relief from Meds: 6  Mobility walk without assistance how many minutes can you walk? 10-45 ability to climb steps?  yes do you drive?  yes  Function disabled: date disabled 87  Neuro/Psych tingling spasms anxiety  Prior Studies Any changes since last visit?  no  Physicians involved in your care Any changes since last visit?  no   Family History  Problem Relation Age of Onset  . Hypertension Mother   . Hypertension Father    Social History   Social History  . Marital Status: Single    Spouse Name: N/A  . Number of Children: N/A  . Years of Education: N/A   Social History Main Topics  . Smoking status: Former Smoker    Quit date: 06/27/2011  . Smokeless tobacco: Never Used  . Alcohol Use: None  . Drug Use: None  . Sexual Activity: Not Asked   Other Topics Concern  . None   Social History Narrative   Past  Surgical History  Procedure Laterality Date  . Spine surgery    . Urinary tract surgery    . Hernia repair     Past Medical History  Diagnosis Date  . Chronic pain syndrome   . Kyphoscoliosis and scoliosis   . Lumbago   . 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 Facet syndrome, lumbar   . Anxiety    BP 166/95 mmHg  Pulse 97  SpO2 98%  Opioid Risk Score:   Fall Risk Score:  `1  Depression screen PHQ 2/9  Depression screen [facility] 2/9 06/13/2015 05/01/2015 01/04/2015  Decreased Interest 0 0 0  Down, Depressed, Hopeless 0 0 0  PHQ - 2 Score 0 0 0  Altered sleeping - - 0  Tired, decreased energy - - 0  Change in appetite - - 1  Feeling bad or failure about yourself  - - 0  Trouble concentrating - - 0  Moving slowly or fidgety/restless - - 0  Suicidal thoughts - - 0  PHQ-9 Score - - 1    Review of Systems  Musculoskeletal:       Spasms  Neurological:       Tingling  Psychiatric/Behavioral: The patient is nervous/anxious.   All other systems reviewed and are negative.      Objective:  Physical Exam Constitutional: He appears well-developed and well-nourished.  HENT:  Head: Normocephalic and atraumatic.  Eyes: Conjunctivae and EOM are normal. Pupils are equal, round, and reactive to light.  Neck: Normal range of motion. Neck supple.  Cardiovascular: Normal rate and regular rhythm.  Pulmonary/Chest: Effort normal and breath sounds normal.  Abdominal: Soft. Bowel sounds are normal.  Musculoskeletal: Normal range of motion.  Pt with persistent limitations in the right low back in regards to ROM. Some pain with end ROM in the thoracic and lumbar spine. Baseline hypoplasia present in scapula and strength. He stands up fairly easily from a seated position. Gait is normal.  Neurological: He is alert. He has normal strength and normal reflexes.  Psychiatric: He has a normal mood and affect. His behavior is normal. Judgment and thought content normal.    Assessment &  Plan:   ASSESSMENT:  1. 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  2. Chronic associated mid to low back pain.   PLAN:  1. I refilled his MS Contin 100 mg q.12 h. #60 with a second refill for next month.   -he was counseled regard mixing etoh with his narcotics--he expressed and understanding 2. Our PA will see him back here in about 2 months' time.  3. Overall [name] is doing quite well. Continue exercise and vocational ventures as he's doing.

## 2015-06-16 ENCOUNTER — Other Ambulatory Visit: Payer: Self-pay | Admitting: *Deleted

## 2015-06-16 MED ORDER — ALPRAZOLAM 1 MG PO TABS: ORAL_TABLET | ORAL | Status: DC

## 2015-06-30 ENCOUNTER — Other Ambulatory Visit: Payer: Self-pay | Admitting: *Deleted

## 2015-06-30 MED ORDER — NORTRIPTYLINE HCL 50 MG PO CAPS: 100.0000 mg | ORAL_CAPSULE | Freq: Every day | ORAL | Status: DC

## 2015-08-08 ENCOUNTER — Encounter: Payer: Self-pay | Admitting: Registered Nurse

## 2015-08-08 ENCOUNTER — Encounter: Payer: Medicare Other | Attending: Physical Medicine and Rehabilitation | Admitting: Registered Nurse

## 2015-08-08 VITALS — BP 151/96 | HR 100

## 2015-08-08 DIAGNOSIS — Q870 Congenital malformation syndromes predominantly affecting facial appearance: Secondary | ICD-10-CM | POA: Diagnosis not present

## 2015-08-08 DIAGNOSIS — Z5181 Encounter for therapeutic drug level monitoring: Secondary | ICD-10-CM | POA: Insufficient documentation

## 2015-08-08 DIAGNOSIS — G894 Chronic pain syndrome: Secondary | ICD-10-CM | POA: Diagnosis present

## 2015-08-08 DIAGNOSIS — Z79899 Other long term (current) drug therapy: Secondary | ICD-10-CM | POA: Insufficient documentation

## 2015-08-08 DIAGNOSIS — M47816 Spondylosis without myelopathy or radiculopathy, lumbar region: Secondary | ICD-10-CM | POA: Insufficient documentation

## 2015-08-08 MED ORDER — MORPHINE SULFATE ER 100 MG PO TBCR: 100.0000 mg | EXTENDED_RELEASE_TABLET | Freq: Two times a day (BID) | ORAL | Status: DC

## 2015-08-08 NOTE — Progress Notes (Signed)
Subjective:    Patient ID: Kenneth Farrell, male    DOB: 23-Dec-1970, 44 y.o.   MRN: [id number]  HPI: [name] is a 44 year old male who returns for follow up for his chronic pain and medication refill. He says his pain is located in his lower back, right hip and right leg laterally. He rates his pain 6. His current exercise regime is walking. Working at [employer] three days a week.  Pain Inventory Average Pain 7 Pain Right Now 6 My pain is intermittent, constant, sharp, burning, dull, stabbing, tingling and aching  In the last 24 hours, has pain interfered with the following? General activity 6 Relation with others 5 Enjoyment of life 6 What TIME of day is your pain at its worst? Morning, Daytime, Evening and Night Sleep (in general) Fair  Pain is worse with: walking, bending, sitting, inactivity, standing and some activites Pain improves with: rest, heat/ice, therapy/exercise, pacing activities and medication Relief from Meds: 5  Mobility walk without assistance how many minutes can you walk? 10-15 ability to climb steps?  yes do you drive?  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  no  Physicians involved in your care Any changes since last visit?  no   Family History  Problem Relation Age of Onset  . Hypertension Mother   . Hypertension Father    Social History   Social History  . Marital Status: Single    Spouse Name: N/A  . Number of Children: N/A  . Years of Education: N/A   Social History Main Topics  . Smoking status: Former Smoker    Quit date: 06/27/2011  . Smokeless tobacco: Never Used  . Alcohol Use: None  . Drug Use: None  . Sexual Activity: Not Asked   Other Topics Concern  . None   Social History Narrative   Past Surgical History  Procedure Laterality Date  . Spine surgery    . Urinary tract surgery    . Hernia repair       Past Medical History  Diagnosis Date  . Chronic pain syndrome   . Kyphoscoliosis and scoliosis   . Lumbago   . 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 Facet syndrome, lumbar   . Anxiety    BP 151/96 mmHg  Pulse 100  SpO2 95%  Opioid Risk Score:   Fall Risk Score:  `1  Depression screen PHQ 2/9  Depression screen [facility] 2/9 06/13/2015 05/01/2015 01/04/2015  Decreased Interest 0 0 0  Down, Depressed, Hopeless 0 0 0  PHQ - 2 Score 0 0 0  Altered sleeping - - 0  Tired, decreased energy - - 0  Change in appetite - - 1  Feeling bad or failure about yourself  - - 0  Trouble concentrating - - 0  Moving slowly or fidgety/restless - - 0  Suicidal thoughts - - 0  PHQ-9 Score - - 1     Review of Systems  Musculoskeletal:       Spasms  Psychiatric/Behavioral: The patient is nervous/anxious.   All other systems reviewed and are negative.      Objective:   Physical Exam  Constitutional: He is oriented to person, place, and time. He appears well-developed and well-nourished.  HENT:  Head: Normocephalic and atraumatic.  Neck: Normal range of motion. Neck supple.  Cardiovascular: Normal rate and regular  rhythm.   Pulmonary/Chest: Effort normal and breath sounds normal.  Musculoskeletal:  Normal Muscle Bulk and Muscle Testing Reveals: Upper Extremities: Full ROM and Muscle Strength 5/5 Rhomboid Tenderness Lumbar Paraspinal Tenderness: L-3- L-5 Lower Extremities: Full ROM and Muscle Strength 5/5 Arises from chair with ease Narrow Based gait  Neurological: He is alert and oriented to person, place, and time.  Skin: Skin is warm and dry.  Psychiatric: He has a normal mood and affect.  Nursing note and vitals reviewed.         Assessment & Plan:  1. 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  2. Chronic Back Pain: MS Contin is effective for pain management and makes a great difference in his quality of Life. Continue using Skelaxin for muscle  spasm and Pamelor for neuropathy. Also recommended alternating heat and ice for shoulder pain.  Refilled: MS Contin  #60,---use one BID. Second script given for the following month.  20 minutes of face to face patient care time was spent during this visit. All questions was encouraged and answered.   F/U in 2 months

## 2015-09-12 ENCOUNTER — Other Ambulatory Visit: Payer: Self-pay | Admitting: *Deleted

## 2015-09-12 MED ORDER — ALPRAZOLAM 1 MG PO TABS: 1.0000 mg | ORAL_TABLET | Freq: Four times a day (QID) | ORAL | Status: DC | PRN

## 2015-09-12 NOTE — Telephone Encounter (Signed)
Pharmacy fax request

## 2015-10-04 ENCOUNTER — Other Ambulatory Visit: Payer: Self-pay

## 2015-10-04 MED ORDER — NORTRIPTYLINE HCL 50 MG PO CAPS: 100.0000 mg | ORAL_CAPSULE | Freq: Every day | ORAL | Status: DC

## 2015-10-12 ENCOUNTER — Other Ambulatory Visit: Payer: Self-pay | Admitting: *Deleted

## 2015-10-12 MED ORDER — ZOLPIDEM TARTRATE 10 MG PO TABS: ORAL_TABLET | ORAL | Status: DC

## 2015-10-12 MED ORDER — NORTRIPTYLINE HCL 50 MG PO CAPS: 100.0000 mg | ORAL_CAPSULE | Freq: Every day | ORAL | Status: DC

## 2015-10-24 ENCOUNTER — Encounter: Payer: Medicare Other | Attending: Physical Medicine and Rehabilitation | Admitting: Registered Nurse

## 2015-10-24 ENCOUNTER — Encounter: Payer: Self-pay | Admitting: Registered Nurse

## 2015-10-24 VITALS — BP 139/91 | HR 110

## 2015-10-24 DIAGNOSIS — M47816 Spondylosis without myelopathy or radiculopathy, lumbar region: Secondary | ICD-10-CM | POA: Diagnosis present

## 2015-10-24 DIAGNOSIS — G894 Chronic pain syndrome: Secondary | ICD-10-CM | POA: Insufficient documentation

## 2015-10-24 DIAGNOSIS — Z79899 Other long term (current) drug therapy: Secondary | ICD-10-CM

## 2015-10-24 DIAGNOSIS — Q870 Congenital malformation syndromes predominantly affecting facial appearance: Secondary | ICD-10-CM | POA: Insufficient documentation

## 2015-10-24 DIAGNOSIS — Z5181 Encounter for therapeutic drug level monitoring: Secondary | ICD-10-CM

## 2015-10-24 MED ORDER — MORPHINE SULFATE ER 100 MG PO TBCR: 100.0000 mg | EXTENDED_RELEASE_TABLET | Freq: Two times a day (BID) | ORAL | Status: DC

## 2015-10-24 NOTE — Progress Notes (Signed)
Subjective:    Patient ID: Kenneth Farrell, male    DOB: [birth year], 45 y.o.   MRN: [id number]  HPI: [name] is a 45 year old male who returns for follow up for his chronic pain and medication refill. He says his pain is located in his neck,lower back radiating into bilateral hips right > than left and into right lower extremity laterally. He rates his pain 6. His current exercise regime is walking. He still working at [employer] three days a week.  Pain Inventory Average Pain 7 Pain Right Now 6 My pain is constant, sharp, burning, dull, stabbing, tingling and aching  In the last 24 hours, has pain interfered with the following? General activity 5 Relation with others 6 Enjoyment of life 5 What TIME of day is your pain at its worst? all Sleep (in general) Fair  Pain is worse with: walking, bending, sitting, inactivity, standing and some activites Pain improves with: rest, heat/ice, therapy/exercise, pacing activities and medication Relief from Meds: 5  Mobility walk without assistance how many minutes can you walk? 10-15 ability to climb steps?  yes do you drive?  yes  Function employed # of hrs/week 25 disabled: date disabled 1993  Neuro/Psych tingling spasms anxiety  Prior Studies Any changes since last visit?  no  Physicians involved in your care Any changes since last visit?  no   Family History  Problem Relation Age of Onset  . Hypertension Mother   . Hypertension Father    Social History   Social History  . Marital Status: Single    Spouse Name: N/A  . Number of Children: N/A  . Years of Education: N/A   Social History Main Topics  . Smoking status: Former Smoker    Quit date: 06/27/2011  . Smokeless tobacco: Never Used  . Alcohol Use: None  . Drug Use: None  . Sexual Activity: Not Asked   Other Topics Concern  . None   Social History Narrative   Past Surgical History  Procedure Laterality Date  . Spine surgery    . Urinary  tract surgery    . Hernia repair     Past Medical History  Diagnosis Date  . Chronic pain syndrome   . Kyphoscoliosis and scoliosis   . Lumbago   . 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 Facet syndrome, lumbar   . Anxiety    BP 139/91 mmHg  Pulse 110  SpO2 100%  Opioid Risk Score:   Fall Risk Score:  `1  Depression screen PHQ 2/9  Depression screen [facility] 2/9 10/24/2015 06/13/2015 05/01/2015 01/04/2015  Decreased Interest 0 0 0 0  Down, Depressed, Hopeless 0 0 0 0  PHQ - 2 Score 0 0 0 0  Altered sleeping - - - 0  Tired, decreased energy - - - 0  Change in appetite - - - 1  Feeling bad or failure about yourself  - - - 0  Trouble concentrating - - - 0  Moving slowly or fidgety/restless - - - 0  Suicidal thoughts - - - 0  PHQ-9 Score - - - 1     Review of Systems  All other systems reviewed and are negative.      Objective:   Physical Exam  Constitutional: He is oriented to person, place, and time. He appears well-developed and well-nourished.  HENT:  Head: Normocephalic and atraumatic.  Neck: Normal range of motion. Neck supple.  Cardiovascular: Normal rate and regular  rhythm.   Pulmonary/Chest: Effort normal and breath sounds normal.  Musculoskeletal:  Normal Muscle Bulk and Muscle Testing Reveals: Upper Extremities: Full ROM and Muscle Strength 5/5 Right Rhomboid Tenderness Lumbar Hypersensitivity Lower Extremities: Full ROM and  Muscle Strength 5/5 Arises from chair with ease Narrow Based Gait  Neurological: He is alert and oriented to person, place, and time.  Skin: Skin is warm and dry.  Psychiatric: He has a normal mood and affect.  Nursing note and vitals reviewed.         Assessment & Plan:  1. 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  2. Chronic Back Pain: MS Contin is effective for pain management and makes a great difference in his quality of Life. Continue using Skelaxin for muscle spasm and Pamelor for  neuropathy. Also recommended alternating heat and ice for shoulder pain.  Refilled: MS Contin  #60,---use one BID. Second script given for the following month.  20 minutes of face to face patient care time was spent during this visit. All questions was encouraged and answered.   F/U in 2 months

## 2015-10-30 LAB — TOXASSURE SELECT,+ANTIDEPR,UR: PDF: 0

## 2015-10-30 LAB — 6-ACETYLMORPHINE,TOXASSURE ADD
6-ACETYLMORPHINE: NEGATIVE
6-acetylmorphine: NOT DETECTED ng/mg creat

## 2015-10-31 NOTE — Progress Notes (Signed)
Urine drug screen for this encounter is consistent for prescribed medication 

## 2015-12-07 ENCOUNTER — Other Ambulatory Visit: Payer: Self-pay | Admitting: Physical Medicine & Rehabilitation

## 2016-01-02 ENCOUNTER — Encounter: Payer: Medicare Other | Admitting: Physical Medicine & Rehabilitation

## 2016-01-04 ENCOUNTER — Other Ambulatory Visit: Payer: Self-pay | Admitting: *Deleted

## 2016-01-04 ENCOUNTER — Telehealth: Payer: Self-pay | Admitting: *Deleted

## 2016-01-04 DIAGNOSIS — M47816 Spondylosis without myelopathy or radiculopathy, lumbar region: Secondary | ICD-10-CM

## 2016-01-04 DIAGNOSIS — Q870 Congenital malformation syndromes predominantly affecting facial appearance: Principal | ICD-10-CM

## 2016-01-04 MED ORDER — MORPHINE SULFATE ER 100 MG PO TBCR: 100.0000 mg | EXTENDED_RELEASE_TABLET | Freq: Two times a day (BID) | ORAL | Status: DC

## 2016-01-04 MED ORDER — NORTRIPTYLINE HCL 50 MG PO CAPS: 100.0000 mg | ORAL_CAPSULE | Freq: Every day | ORAL | Status: DC

## 2016-01-04 NOTE — Telephone Encounter (Signed)
Patients wife Kenneth Farrell called and the patients father has passed away, looking to be able to pickup a script best number to call Kenneth Farrell at is [phone]

## 2016-01-04 NOTE — Telephone Encounter (Signed)
Kenneth Farrell called and his father is not doing well and may not make it through the week.  He is asking if his wife can pick up his prescriptions this month,

## 2016-01-04 NOTE — Telephone Encounter (Signed)
Return Mr. Hollenberg call and spoke to Mrs. Ketcher. Mr. Pankratz father passed away yesterday 01-04-2016. His funeral will be next week. We will allow Mrs. Bellina to pick up his prescription today. His appointment will be changed to 02/05/16 at 1:00 pm.  She will be given a new appointment card. She verbalizes understanding.

## 2016-01-09 ENCOUNTER — Encounter: Payer: Medicare Other | Admitting: Registered Nurse

## 2016-02-05 ENCOUNTER — Encounter: Payer: Medicare Other | Admitting: Registered Nurse

## 2016-02-06 ENCOUNTER — Encounter: Payer: Self-pay | Admitting: Physical Medicine & Rehabilitation

## 2016-02-06 ENCOUNTER — Encounter: Payer: Medicare Other | Attending: Physical Medicine & Rehabilitation | Admitting: Registered Nurse

## 2016-02-06 ENCOUNTER — Encounter: Payer: Self-pay | Admitting: Registered Nurse

## 2016-02-06 VITALS — BP 154/96 | HR 104

## 2016-02-06 DIAGNOSIS — M549 Dorsalgia, unspecified: Secondary | ICD-10-CM | POA: Insufficient documentation

## 2016-02-06 DIAGNOSIS — G8929 Other chronic pain: Secondary | ICD-10-CM | POA: Diagnosis not present

## 2016-02-06 DIAGNOSIS — M47816 Spondylosis without myelopathy or radiculopathy, lumbar region: Secondary | ICD-10-CM | POA: Diagnosis not present

## 2016-02-06 DIAGNOSIS — G629 Polyneuropathy, unspecified: Secondary | ICD-10-CM | POA: Diagnosis not present

## 2016-02-06 DIAGNOSIS — Z87891 Personal history of nicotine dependence: Secondary | ICD-10-CM | POA: Insufficient documentation

## 2016-02-06 DIAGNOSIS — F419 Anxiety disorder, unspecified: Secondary | ICD-10-CM | POA: Insufficient documentation

## 2016-02-06 DIAGNOSIS — Q870 Congenital malformation syndromes predominantly affecting facial appearance: Secondary | ICD-10-CM | POA: Insufficient documentation

## 2016-02-06 DIAGNOSIS — G894 Chronic pain syndrome: Secondary | ICD-10-CM | POA: Diagnosis not present

## 2016-02-06 DIAGNOSIS — M62838 Other muscle spasm: Secondary | ICD-10-CM | POA: Insufficient documentation

## 2016-02-06 DIAGNOSIS — M419 Scoliosis, unspecified: Secondary | ICD-10-CM | POA: Diagnosis not present

## 2016-02-06 DIAGNOSIS — Z79899 Other long term (current) drug therapy: Secondary | ICD-10-CM | POA: Diagnosis not present

## 2016-02-06 DIAGNOSIS — Z5181 Encounter for therapeutic drug level monitoring: Secondary | ICD-10-CM | POA: Diagnosis not present

## 2016-02-06 MED ORDER — MORPHINE SULFATE ER 100 MG PO TBCR: 100.0000 mg | EXTENDED_RELEASE_TABLET | Freq: Two times a day (BID) | ORAL | Status: DC

## 2016-02-06 NOTE — Progress Notes (Signed)
Subjective:    Patient ID: Kenneth Farrell, male    DOB: 05-17-1971, 45 y.o.   MRN: [id number]  HPI: Kenneth Farrell is a 45 year old male who returns for follow up for his chronic pain and medication refill. He states his pain is located in his lower back radiating into right hip and into right lower extremity laterally. He rates his pain 6. His current exercise regime is walking.  He's  working at [employer] three days a week.  Pain Inventory Average Pain 4 Pain Right Now 6 My pain is intermittent, constant, sharp, burning, dull, stabbing, tingling and aching  In the last 24 hours, has pain interfered with the following? General activity 7 Relation with others 7 Enjoyment of life 7 What TIME of day is your pain at its worst? all times Sleep (in general) Fair  Pain is worse with: walking, bending, sitting, inactivity, standing, unsure and some activites Pain improves with: rest, heat/ice, therapy/exercise, pacing activities and medication Relief from Meds: 4  Mobility walk without assistance how many minutes can you walk? 10-15 ability to climb steps?  yes do you drive?  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  no  Physicians involved in your care Any changes since last visit?  no   Family History  Problem Relation Age of Onset  . Hypertension Mother   . Hypertension Father    Social History   Social History  . Marital Status: Single    Spouse Name: N/A  . Number of Children: N/A  . Years of Education: N/A   Social History Main Topics  . Smoking status: Former Smoker    Quit date: 06/27/2011  . Smokeless tobacco: Never Used  . Alcohol Use: None  . Drug Use: None  . Sexual Activity: Not Asked   Other Topics Concern  . None   Social History Narrative   Past Surgical History  Procedure Laterality Date  . Spine  surgery    . Urinary tract surgery    . Hernia repair     Past Medical History  Diagnosis Date  . Chronic pain syndrome   . Kyphoscoliosis and scoliosis   . Lumbago   . 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 Facet syndrome, lumbar   . Anxiety    BP 154/96 mmHg  Pulse 104  SpO2 100%  Opioid Risk Score:   Fall Risk Score:  `1  Depression screen PHQ 2/9  Depression screen [facility] 2/9 10/24/2015 06/13/2015 05/01/2015 01/04/2015  Decreased Interest 0 0 0 0  Down, Depressed, Hopeless 0 0 0 0  PHQ - 2 Score 0 0 0 0  Altered sleeping - - - 0  Tired, decreased energy - - - 0  Change in appetite - - - 1  Feeling bad or failure about yourself  - - - 0  Trouble concentrating - - - 0  Moving slowly or fidgety/restless - - - 0  Suicidal thoughts - - - 0  PHQ-9 Score - - - 1     Review of Systems     Objective:   Physical Exam  Constitutional: He is oriented to person, place, and time. He appears well-developed and well-nourished.  HENT:  Head: Normocephalic and atraumatic.  Neck: Normal range of motion. Neck supple.  Cardiovascular: Normal rate and regular rhythm.   Pulmonary/Chest: Effort  normal and breath sounds normal.  Musculoskeletal:  Normal Muscle Bulk and Muscle Testing Reveals: Upper Extremities: Full ROM and Muscle Strength 5/5 Thoracic Paraspinal Tenderness: T-10- T-12 Lumbar Paraspinal Tenderness: L-3- L-5 Lower Extremities: Full ROM and Muscle Strength 5/5 Arises from chair with ease Narrow Based gait   Neurological: He is alert and oriented to person, place, and time.  Skin: Skin is warm and dry.  Psychiatric: He has a normal mood and affect.  Nursing note and vitals reviewed.         Assessment & Plan:  1. 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  2. Chronic Back Pain: MS Contin is effective for pain management and makes a great difference in his quality of Life. Continue using Skelaxin for muscle spasm and Pamelor for  neuropathy. Also recommended alternating heat and ice for shoulder pain.  Refilled: MS Contin  #60,---use one BID. Second script given for the following month. We will continue the opioid monitoring program, this consists of regular clinic visits, examinations, urine drug screen, pill counts as well as use of [place] Controlled Substance Reporting System.  20 minutes of face to face patient care time was spent during this visit. All questions was encouraged and answered.   F/U in 2 months

## 2016-02-09 ENCOUNTER — Telehealth: Payer: Self-pay

## 2016-02-09 MED ORDER — ZOLPIDEM TARTRATE 10 MG PO TABS: ORAL_TABLET | ORAL | Status: DC

## 2016-02-09 NOTE — Telephone Encounter (Signed)
Pt is requesting a refill for his Ambien. I do not see recent documentation on this med. Please advise on refill.

## 2016-02-09 NOTE — Telephone Encounter (Signed)
Please advise 

## 2016-02-09 NOTE — Telephone Encounter (Signed)
Patient called to let us know he will not have any of his Ambien to take tonight, will need a refill on his medication.  Please call patient.

## 2016-02-09 NOTE — Telephone Encounter (Signed)
I spoke with Kenneth Farrell he has been taking Ambien HS, I reviewed NCCSR last Ambien prescription picked up on 01/08/2016. Ambien called into [facility].

## 2016-02-11 LAB — TOXASSURE SELECT,+ANTIDEPR,UR

## 2016-02-11 LAB — 6-ACETYLMORPHINE,TOXASSURE ADD
6-ACETYLMORPHINE: NEGATIVE
6-ACETYLMORPHINE: NOT DETECTED ng/mg{creat}

## 2016-02-13 NOTE — Progress Notes (Signed)
Urine drug screen for this encounter is consistent for prescribed medication 

## 2016-04-01 ENCOUNTER — Other Ambulatory Visit: Payer: Self-pay | Admitting: Physical Medicine & Rehabilitation

## 2016-04-02 ENCOUNTER — Ambulatory Visit: Payer: Medicare Other | Admitting: Registered Nurse

## 2016-04-02 ENCOUNTER — Telehealth: Payer: Self-pay

## 2016-04-02 NOTE — Telephone Encounter (Signed)
Pt is returning your call

## 2016-04-02 NOTE — Telephone Encounter (Signed)
Please advise on refill of Xanax. Thanks!

## 2016-04-03 MED ORDER — ALPRAZOLAM 1 MG PO TABS: ORAL_TABLET | ORAL | Status: DC

## 2016-04-03 NOTE — Telephone Encounter (Signed)
Spoke with Mr. Bitz, He is taking his Alprazolam 1 mg four times a day. Medication called into the pharmacy. He verbalizes understanding.

## 2016-04-10 ENCOUNTER — Encounter: Payer: Medicare Other | Attending: Physical Medicine & Rehabilitation | Admitting: Registered Nurse

## 2016-04-10 ENCOUNTER — Encounter: Payer: Self-pay | Admitting: Registered Nurse

## 2016-04-10 VITALS — BP 147/99 | HR 104 | Resp 16

## 2016-04-10 DIAGNOSIS — G8929 Other chronic pain: Secondary | ICD-10-CM | POA: Insufficient documentation

## 2016-04-10 DIAGNOSIS — F411 Generalized anxiety disorder: Secondary | ICD-10-CM | POA: Diagnosis not present

## 2016-04-10 DIAGNOSIS — G4700 Insomnia, unspecified: Secondary | ICD-10-CM

## 2016-04-10 DIAGNOSIS — M47816 Spondylosis without myelopathy or radiculopathy, lumbar region: Secondary | ICD-10-CM | POA: Diagnosis not present

## 2016-04-10 DIAGNOSIS — M62838 Other muscle spasm: Secondary | ICD-10-CM | POA: Diagnosis not present

## 2016-04-10 DIAGNOSIS — Z87891 Personal history of nicotine dependence: Secondary | ICD-10-CM | POA: Diagnosis not present

## 2016-04-10 DIAGNOSIS — F419 Anxiety disorder, unspecified: Secondary | ICD-10-CM | POA: Insufficient documentation

## 2016-04-10 DIAGNOSIS — G629 Polyneuropathy, unspecified: Secondary | ICD-10-CM | POA: Insufficient documentation

## 2016-04-10 DIAGNOSIS — M419 Scoliosis, unspecified: Secondary | ICD-10-CM | POA: Diagnosis not present

## 2016-04-10 DIAGNOSIS — M549 Dorsalgia, unspecified: Secondary | ICD-10-CM | POA: Insufficient documentation

## 2016-04-10 DIAGNOSIS — G894 Chronic pain syndrome: Secondary | ICD-10-CM | POA: Diagnosis not present

## 2016-04-10 DIAGNOSIS — Q870 Congenital malformation syndromes predominantly affecting facial appearance: Secondary | ICD-10-CM | POA: Diagnosis not present

## 2016-04-10 DIAGNOSIS — Z79899 Other long term (current) drug therapy: Secondary | ICD-10-CM | POA: Diagnosis not present

## 2016-04-10 DIAGNOSIS — Z5181 Encounter for therapeutic drug level monitoring: Secondary | ICD-10-CM

## 2016-04-10 MED ORDER — MORPHINE SULFATE ER 100 MG PO TBCR: 100.0000 mg | EXTENDED_RELEASE_TABLET | Freq: Two times a day (BID) | ORAL | Status: DC

## 2016-04-10 NOTE — Progress Notes (Signed)
Subjective:    Patient ID: Kenneth Farrell, male    DOB: Dec 20, 1970, 45 y.o.   MRN: [id number]  HPI: [name] is a 45 year old male who returns for follow up for his chronic pain and medication refill. He states his pain is located in his upper-lower back.He rates his pain 5. His current exercise regime is walking. He's working at [employer] three days a week.  Pain Inventory Average Pain 6 Pain Right Now 5 My pain is constant, sharp, burning, dull, stabbing, tingling and aching  In the last 24 hours, has pain interfered with the following? General activity 7 Relation with others 6 Enjoyment of life 6 What TIME of day is your pain at its worst? na Sleep (in general) Fair  Pain is worse with: walking, bending, sitting, inactivity, standing and some activites Pain improves with: rest, heat/ice, therapy/exercise, pacing activities and medication Relief from Meds: 4  Mobility walk without assistance how many minutes can you walk? 10-15 ability to climb steps?  yes do you drive?  yes  Function employed # of hrs/week 25 disabled: date disabled 1993  Neuro/Psych spasms anxiety  Prior Studies Any changes since last visit?  no  Physicians involved in your care Any changes since last visit?  no   Family History  Problem Relation Age of Onset  . Hypertension Mother   . Hypertension Father    Social History   Social History  . Marital Status: Single    Spouse Name: N/A  . Number of Children: N/A  . Years of Education: N/A   Social History Main Topics  . Smoking status: Former Smoker    Quit date: 06/27/2011  . Smokeless tobacco: Never Used  . Alcohol Use: None  . Drug Use: None  . Sexual Activity: Not Asked   Other Topics Concern  . None   Social History Narrative   Past Surgical History  Procedure Laterality Date  . Spine surgery    . Urinary tract surgery    . Hernia repair     Past Medical History  Diagnosis Date  . Chronic pain syndrome    . Kyphoscoliosis and scoliosis   . Lumbago   . 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 Facet syndrome, lumbar   . Anxiety    BP 156/95 mmHg  Pulse 114  Resp 16  SpO2 95%  Opioid Risk Score:   Fall Risk Score:  `1  Depression screen PHQ 2/9  Depression screen [facility] 2/9 04/10/2016 10/24/2015 06/13/2015 05/01/2015 01/04/2015  Decreased Interest 0 0 0 0 0  Down, Depressed, Hopeless 0 0 0 0 0  PHQ - 2 Score 0 0 0 0 0  Altered sleeping - - - - 0  Tired, decreased energy - - - - 0  Change in appetite - - - - 1  Feeling bad or failure about yourself  - - - - 0  Trouble concentrating - - - - 0  Moving slowly or fidgety/restless - - - - 0  Suicidal thoughts - - - - 0  PHQ-9 Score - - - - 1       Review of Systems  Constitutional: Negative.   HENT: Negative.   Cardiovascular: Negative.   Genitourinary: Negative.   Musculoskeletal: Negative.   Neurological: Negative.   Psychiatric/Behavioral: Negative.   All other systems reviewed and are negative.      Objective:   Physical Exam  Constitutional: He is oriented to person, place,  and time. He appears well-developed and well-nourished.  HENT:  Head: Normocephalic and atraumatic.  Neck: Normal range of motion. Neck supple.  Cardiovascular: Normal rate and regular rhythm.   Pulmonary/Chest: Effort normal and breath sounds normal.  Musculoskeletal:  Normal Muscle Bulk and Muscle Testing Reveals: Upper Extremities: Full ROM and Muscle Strength 5/5 Thoracic Hypersensitivity: T-7- T-10 Lumbar Hypersensitivity: L-3- L-5 Lower Extremities: Full ROM and Muscle Strength 5/5 Arises from Table with ease Narrow Based Gait   Neurological: He is alert and oriented to person, place, and time.  Skin: Skin is warm and dry.  Psychiatric: He has a normal mood and affect.  Nursing note and vitals reviewed.         Assessment & Plan:  1. 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  2. Chronic Back Pain:  MS Contin is effective for pain management and makes a great difference in his quality of Life. Continue using Skelaxin for muscle spasm and Pamelor for neuropathy. Also recommended alternating heat and ice for shoulder pain.  Refilled: MS Contin  #60,---use one BID. Second script given for the following month. We will continue the opioid monitoring program, this consists of regular clinic visits, examinations, urine drug screen, pill counts as well as use of [place] Controlled Substance Reporting System. 3. Anxiety: Continue Xanax 4. Insomnia: Continue Ambien  20 minutes of face to face patient care time was spent during this visit. All questions was encouraged and answered.   F/U in 2 months

## 2016-04-12 ENCOUNTER — Other Ambulatory Visit: Payer: Self-pay | Admitting: Registered Nurse

## 2016-04-29 ENCOUNTER — Telehealth: Payer: Self-pay | Admitting: Physical Medicine & Rehabilitation

## 2016-04-29 NOTE — Telephone Encounter (Signed)
[facility] needs a call back on patients prescription Metaxalone for prior authorization.  They said they had faxed this over but haven't gotten a response.  Please call them at [phone]  REF# [id number].

## 2016-05-01 ENCOUNTER — Other Ambulatory Visit: Payer: Self-pay | Admitting: Physical Medicine & Rehabilitation

## 2016-05-02 NOTE — Telephone Encounter (Signed)
A letter has been faxed to [facility] Department of Appeals and Grievances for the [facility]).

## 2016-05-02 NOTE — Telephone Encounter (Signed)
I spoke with Kenneth Farrell's wife Kenneth Farrell about his skelaxin being denied by insurance.  He is unable to tolerate NSAIDS and has had a reaction to the baclofen his PCP prescribed --up all night unable to sleep x 3 nights. She will call back and let us know if he has taken flexeril in the past.

## 2016-05-06 ENCOUNTER — Telehealth: Payer: Self-pay | Admitting: Physical Medicine & Rehabilitation

## 2016-05-06 NOTE — Telephone Encounter (Signed)
Coleen with appeals department with [facility] has called to let us know that patients metaxalone has been denied.  They will be sending a letter to patient and our office for 2nd level review for pier to pier with physician.  She only left the number [phone], that was all that was left on voicemail.

## 2016-05-28 ENCOUNTER — Ambulatory Visit: Payer: Medicare Other | Admitting: Physical Medicine & Rehabilitation

## 2016-06-10 ENCOUNTER — Other Ambulatory Visit: Payer: Self-pay | Admitting: Physical Medicine & Rehabilitation

## 2016-06-11 ENCOUNTER — Other Ambulatory Visit: Payer: Self-pay | Admitting: Physical Medicine & Rehabilitation

## 2016-06-11 ENCOUNTER — Encounter: Payer: Medicare Other | Attending: Physical Medicine & Rehabilitation | Admitting: Physical Medicine & Rehabilitation

## 2016-06-11 ENCOUNTER — Encounter: Payer: Self-pay | Admitting: Physical Medicine & Rehabilitation

## 2016-06-11 VITALS — BP 160/102 | HR 121 | Resp 17

## 2016-06-11 DIAGNOSIS — F419 Anxiety disorder, unspecified: Secondary | ICD-10-CM | POA: Diagnosis not present

## 2016-06-11 DIAGNOSIS — Z79899 Other long term (current) drug therapy: Secondary | ICD-10-CM | POA: Diagnosis not present

## 2016-06-11 DIAGNOSIS — G8929 Other chronic pain: Secondary | ICD-10-CM | POA: Insufficient documentation

## 2016-06-11 DIAGNOSIS — Q870 Congenital malformation syndromes predominantly affecting facial appearance: Secondary | ICD-10-CM | POA: Insufficient documentation

## 2016-06-11 DIAGNOSIS — G4700 Insomnia, unspecified: Secondary | ICD-10-CM | POA: Diagnosis not present

## 2016-06-11 DIAGNOSIS — G629 Polyneuropathy, unspecified: Secondary | ICD-10-CM | POA: Diagnosis not present

## 2016-06-11 DIAGNOSIS — M419 Scoliosis, unspecified: Secondary | ICD-10-CM | POA: Diagnosis not present

## 2016-06-11 DIAGNOSIS — M549 Dorsalgia, unspecified: Secondary | ICD-10-CM | POA: Insufficient documentation

## 2016-06-11 DIAGNOSIS — M47816 Spondylosis without myelopathy or radiculopathy, lumbar region: Secondary | ICD-10-CM | POA: Diagnosis not present

## 2016-06-11 DIAGNOSIS — Z5181 Encounter for therapeutic drug level monitoring: Secondary | ICD-10-CM

## 2016-06-11 DIAGNOSIS — M62838 Other muscle spasm: Secondary | ICD-10-CM | POA: Insufficient documentation

## 2016-06-11 DIAGNOSIS — G894 Chronic pain syndrome: Secondary | ICD-10-CM | POA: Diagnosis not present

## 2016-06-11 DIAGNOSIS — Z87891 Personal history of nicotine dependence: Secondary | ICD-10-CM | POA: Insufficient documentation

## 2016-06-11 MED ORDER — ZOLPIDEM TARTRATE 10 MG PO TABS: ORAL_TABLET | ORAL | 3 refills | Status: DC

## 2016-06-11 MED ORDER — MORPHINE SULFATE ER 100 MG PO TBCR: 100.0000 mg | EXTENDED_RELEASE_TABLET | Freq: Two times a day (BID) | ORAL | 0 refills | Status: DC

## 2016-06-11 NOTE — Patient Instructions (Signed)
PLEASE CALL ME WITH ANY PROBLEMS OR QUESTIONS ([phone])  

## 2016-06-11 NOTE — Addendum Note (Signed)
Addended by: Kathrine Haddock L on: 06/11/2016 03:11 PM   Modules accepted: Orders

## 2016-06-11 NOTE — Progress Notes (Signed)
Subjective:    Patient ID: Kenneth Farrell, male    DOB: [birth year], 45 y.o.   MRN: [id number]  HPI   Larae GroomsRonald Mennella is here in follow up of his chronic pain. He has been feeling well. He remains married and life is good. He and his wife are contemplating having a child and have undergone genetic counseling.   He stays active around the house. He walks daily---[phone] steps daily [place] doct[place].    He takes miralax and senokot S daily for his bowels as well as extra fiber. With his regimen his bowels move regularly.  Pain Inventory Average Pain 6 Pain Right Now 5 My pain is constant, sharp, burning, dull, stabbing, tingling and aching  In the last 24 hours, has pain interfered with the following? General activity 5 Relation with others 6 Enjoyment of life 6 What TIME of day is your pain at its worst? constant Sleep (in general) Fair  Pain is worse with: walking, bending, sitting, inactivity, standing, unsure and some activites Pain improves with: rest, heat/ice, therapy/exercise, pacing activities and medication Relief from Meds: 5  Mobility walk without assistance how many minutes can you walk? 10-15 ability to climb steps?  yes do you drive?  yes  Function employed # of hrs/week 25  Neuro/Psych tingling spasms anxiety  Prior Studies Any changes since last visit?  no  Physicians involved in your care Any changes since last visit?  no   Family History  Problem Relation Age of Onset  . Hypertension Mother   . Hypertension Father    Social History   Social History  . Marital status: Single    Spouse name: N/A  . Number of children: N/A  . Years of education: N/A   Social History Main Topics  . Smoking status: Former Smoker    Quit date: 06/27/2011  . Smokeless tobacco: Never Used  . Alcohol use None  . Drug use: Unknown  . Sexual activity: Not Asked   Other Topics Concern  . None   Social History Narrative  . None    Past Surgical History:  Procedure Laterality Date  . HERNIA REPAIR    . SPINE SURGERY    . urinary tract surgery     Past Medical History:  Diagnosis Date  . Anxiety   . Chronic pain syndrome   . 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 Facet syndrome, lumbar   . Kyphoscoliosis and scoliosis   . Lumbago    BP (!) 160/102   Pulse (!) 121   Resp 17   SpO2 95%   Opioid Risk Score:   Fall Risk Score:  `1  Depression screen PHQ 2/9  [facility] 04/10/2016 10/24/2015 06/13/2015 05/01/2015 01/04/2015  Decreased Interest 0 0 0 0 0  Down, Depressed, Hopeless 0 0 0 0 0  PHQ - 2 Score 0 0 0 0 0  Altered sleeping - - - - 0  Tired, decreased energy - - - - 0  Change in appetite - - - - 1  Feeling bad or failure about yourself  - - - - 0  Trouble concentrating - - - - 0  Moving slowly or fidgety/restless - - - - 0  Suicidal thoughts - - - - 0  PHQ-9 Score - - - - 1    Review of Systems  Neurological:       Tingling Spasms  Psychiatric/Behavioral: The patient is nervous/anxious.   All other systems reviewed and are negative.      Objective:   Physical Exam  Constitutional: He appears well-developed and well-nourished.  HENT:  Head: Normocephalic and atraumatic.  Eyes: Conjunctivae and EOM are normal. Pupils are equal, round, and reactive to light.  Neck: Normal range of motion. Neck supple.  Cardiovascular: Normal rate and regular rhythm.  Pulmonary/Chest: Effort normal and breath sounds normal.  Abdominal: Soft. Bowel sounds are normal.  Musculoskeletal: Normal range of motion.  Pt with persistent limitations in the right low back in regards to ROM. Some pain with end ROM in the thoracic and lumbar spine. Baseline hypoplasia present in scapula and strength. He stands up fairly easily from a seated position. Gait is normal.  Neurological: He is alert. He has normal strength and normal reflexes.  Psychiatric: He has a normal mood and affect. His  behavior is normal. Judgment and thought content normal.       Assessment & Plan:  ASSESSMENT:  1. 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  2. Chronic associated mid to low back pain.   PLAN:   1. I refilled his MS Contin 100 mg q.12 h. #60 with a second refill for next month.                        2. Our PA will see him back here in about 2 months' time. Fifteen minutes of face to face patient care time were spent during this visit. All questions were encouraged and answered.   3. Continue exercise and vocational ventures as he's doing.

## 2016-06-18 LAB — TOXASSURE SELECT,+ANTIDEPR,UR

## 2016-06-19 NOTE — Progress Notes (Signed)
Urine drug screen for this encounter is consistent for prescribed medications.   

## 2016-07-24 ENCOUNTER — Other Ambulatory Visit: Payer: Self-pay | Admitting: Physical Medicine & Rehabilitation

## 2016-07-29 ENCOUNTER — Other Ambulatory Visit: Payer: Self-pay | Admitting: Registered Nurse

## 2016-08-06 ENCOUNTER — Encounter: Payer: Medicare Other | Attending: Physical Medicine & Rehabilitation | Admitting: Physical Medicine & Rehabilitation

## 2016-08-06 ENCOUNTER — Encounter: Payer: Self-pay | Admitting: Physical Medicine & Rehabilitation

## 2016-08-06 DIAGNOSIS — Q870 Congenital malformation syndromes predominantly affecting facial appearance: Secondary | ICD-10-CM | POA: Insufficient documentation

## 2016-08-06 DIAGNOSIS — G894 Chronic pain syndrome: Secondary | ICD-10-CM | POA: Insufficient documentation

## 2016-08-06 DIAGNOSIS — G8929 Other chronic pain: Secondary | ICD-10-CM | POA: Diagnosis not present

## 2016-08-06 DIAGNOSIS — M419 Scoliosis, unspecified: Secondary | ICD-10-CM | POA: Diagnosis not present

## 2016-08-06 DIAGNOSIS — Z79899 Other long term (current) drug therapy: Secondary | ICD-10-CM | POA: Insufficient documentation

## 2016-08-06 DIAGNOSIS — F419 Anxiety disorder, unspecified: Secondary | ICD-10-CM | POA: Insufficient documentation

## 2016-08-06 DIAGNOSIS — Z87891 Personal history of nicotine dependence: Secondary | ICD-10-CM | POA: Insufficient documentation

## 2016-08-06 DIAGNOSIS — G629 Polyneuropathy, unspecified: Secondary | ICD-10-CM | POA: Diagnosis not present

## 2016-08-06 DIAGNOSIS — M62838 Other muscle spasm: Secondary | ICD-10-CM | POA: Insufficient documentation

## 2016-08-06 DIAGNOSIS — M549 Dorsalgia, unspecified: Secondary | ICD-10-CM | POA: Diagnosis present

## 2016-08-06 MED ORDER — MORPHINE SULFATE ER 100 MG PO TBCR: 100.0000 mg | EXTENDED_RELEASE_TABLET | Freq: Two times a day (BID) | ORAL | 0 refills | Status: DC

## 2016-08-06 NOTE — Progress Notes (Signed)
Subjective:    Patient ID: Kenneth Farrell, male    DOB: Jan 10, 1971, 45 y.o.   MRN: [id number]  HPI   He is back regarding his chronic pain. He is doing well from a standpoint of his pain. He lost his father earlier this year and it has been a hard adjustment. He and his wife are considering having a baby. They are getting genetic counseling.   He has been compliant with our pain mgt program. The medications are still workign for him.   Pain Inventory Average Pain 7 Pain Right Now 6 My pain is intermittent, constant, sharp, burning, dull, stabbing, tingling and aching  In the last 24 hours, has pain interfered with the following? General activity 7 Relation with others 7 Enjoyment of life 7 What TIME of day is your pain at its worst? all Sleep (in general) Fair  Pain is worse with: walking, bending, sitting, inactivity, standing and some activites Pain improves with: rest, heat/ice, therapy/exercise, pacing activities and medication Relief from Meds: n/a  Mobility walk without assistance how many minutes can you walk? 10-15 ability to climb steps?  yes do you drive?  yes  Function employed # of hrs/week 25 disabled: date disabled 36  Neuro/Psych trouble walking spasms anxiety  Prior Studies Any changes since last visit?  no  Physicians involved in your care Any changes since last visit?  no   Family History  Problem Relation Age of Onset  . Hypertension Mother   . Hypertension Father    Social History   Social History  . Marital status: Single    Spouse name: N/A  . Number of children: N/A  . Years of education: N/A   Social History Main Topics  . Smoking status: Former Smoker    Quit date: 06/27/2011  . Smokeless tobacco: Never Used  . Alcohol use Not on file  . Drug use: Unknown  . Sexual activity: Not on file   Other Topics Concern  . Not on file   Social History Narrative  . No narrative on file   Past Surgical History:  Procedure  Laterality Date  . HERNIA REPAIR    . SPINE SURGERY    . urinary tract surgery     Past Medical History:  Diagnosis Date  . Anxiety   . Chronic pain syndrome   . 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 Facet syndrome, lumbar   . Kyphoscoliosis and scoliosis   . Lumbago    BP (!) 153/74   Pulse (!) 114   Resp 14   SpO2 95%   Opioid Risk Score:   Fall Risk Score:  `1  Depression screen PHQ 2/9  Depression screen [facility] 2/9 04/10/2016 10/24/2015 06/13/2015 05/01/2015 01/04/2015  Decreased Interest 0 0 0 0 0  Down, Depressed, Hopeless 0 0 0 0 0  PHQ - 2 Score 0 0 0 0 0  Altered sleeping - - - - 0  Tired, decreased energy - - - - 0  Change in appetite - - - - 1  Feeling bad or failure about yourself  - - - - 0  Trouble concentrating - - - - 0  Moving slowly or fidgety/restless - - - - 0  Suicidal thoughts - - - - 0  PHQ-9 Score - - - - 1    Review of Systems  All other systems reviewed and are negative.      Objective:   Physical Exam  Constitutional: He  appears well-developed and well-nourished.  HENT:  Head: Normocephalic and atraumatic.  Eyes: Conjunctivae and EOM are normal. Pupils are equal, round, and reactive to light.  Neck: Normal range of motion. Neck supple.  Cardiovascular: Normal rate and regular rhythm.  Pulmonary/Chest: Effort normal and breath sounds normal.  Abdominal: Soft. Bowel sounds are normal.  Musculoskeletal: Normal range of motion.  Pt with persistent limitations in the right low back in regards to ROM. Some pain with end ROM in the thoracic and lumbar spine.   Hypoplasia remains in scapula and strength.transfers and gait is normal.  Neurological: He is alert. He has normal strength and normal reflexes.  Psychiatric: He has a normal mood and affect. His behavior is normal. Cognitively intact      Assessment & Plan:  ASSESSMENT:  1. 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  2. Chronic associated  mid to low back pain.    PLAN:   1. I refilled his MS Contin 100 mg q.12 h. #60 with a second refill for next month. He has been compliant with our regimen.  2. Return visit in about 2 months' time. Fifteen minutes of face to face patient care time were spent during this visit. All questions were encouraged and answered.

## 2016-08-06 NOTE — Patient Instructions (Signed)
PLEASE CALL ME WITH ANY PROBLEMS OR QUESTIONS ([phone])  

## 2016-09-24 ENCOUNTER — Other Ambulatory Visit: Payer: Self-pay | Admitting: Physical Medicine & Rehabilitation

## 2016-09-24 NOTE — Telephone Encounter (Signed)
Patient may be going out of town and may not be in town for his upcoming appt in [place] patient needs a refill on MS CONTIN and is wondering if wife can come by to pick up for him. Please call patient wife with any questions - [phone] Raynelle Fanningjulie

## 2016-09-25 NOTE — Telephone Encounter (Signed)
I let Mr Kenneth Farrell know that Dr Riley Kill and Riley Lam is out of town until after Christmas and we cannot give him permission to pick up an RX.  He can call back after Christmas if he knows for certain he is going out of town and discuss with [place].

## 2016-10-02 ENCOUNTER — Telehealth: Payer: Self-pay | Admitting: Physical Medicine & Rehabilitation

## 2016-10-02 NOTE — Telephone Encounter (Signed)
Patient is wanting to go out of town for [employer] and would like to know if his wife can pick up his prescriptions this week and reschedule for Feb.  Please call patient of wife Raynelle Fanning) if this can be done.

## 2016-10-03 ENCOUNTER — Telehealth: Payer: Self-pay | Admitting: Physical Medicine & Rehabilitation

## 2016-10-03 NOTE — Telephone Encounter (Signed)
Patient called to let us know that he would be at his visit on 10/08/16 and that he would not be going out of town.

## 2016-10-03 NOTE — Telephone Encounter (Signed)
I left message for Kenneth Farrell that he will have to be seen today or tomorrow for his refill or else keep his 1/2 appt for refill.  We cannot have rx picked up because he has not been seen since October. I also told him he would need to be prepared that we are moving pts on opioids back to monthly schedules to be seen.

## 2016-10-08 ENCOUNTER — Encounter: Payer: Medicare Other | Attending: Physical Medicine & Rehabilitation | Admitting: Registered Nurse

## 2016-10-08 ENCOUNTER — Encounter: Payer: Self-pay | Admitting: Registered Nurse

## 2016-10-08 VITALS — BP 160/95 | HR 107 | Resp 16

## 2016-10-08 DIAGNOSIS — Z5181 Encounter for therapeutic drug level monitoring: Secondary | ICD-10-CM

## 2016-10-08 DIAGNOSIS — F419 Anxiety disorder, unspecified: Secondary | ICD-10-CM | POA: Diagnosis not present

## 2016-10-08 DIAGNOSIS — G629 Polyneuropathy, unspecified: Secondary | ICD-10-CM | POA: Insufficient documentation

## 2016-10-08 DIAGNOSIS — G8929 Other chronic pain: Secondary | ICD-10-CM | POA: Insufficient documentation

## 2016-10-08 DIAGNOSIS — F411 Generalized anxiety disorder: Secondary | ICD-10-CM

## 2016-10-08 DIAGNOSIS — G4700 Insomnia, unspecified: Secondary | ICD-10-CM

## 2016-10-08 DIAGNOSIS — Z79899 Other long term (current) drug therapy: Secondary | ICD-10-CM

## 2016-10-08 DIAGNOSIS — M419 Scoliosis, unspecified: Secondary | ICD-10-CM | POA: Diagnosis not present

## 2016-10-08 DIAGNOSIS — M47816 Spondylosis without myelopathy or radiculopathy, lumbar region: Secondary | ICD-10-CM | POA: Diagnosis not present

## 2016-10-08 DIAGNOSIS — Z87891 Personal history of nicotine dependence: Secondary | ICD-10-CM | POA: Insufficient documentation

## 2016-10-08 DIAGNOSIS — M549 Dorsalgia, unspecified: Secondary | ICD-10-CM | POA: Diagnosis present

## 2016-10-08 DIAGNOSIS — M62838 Other muscle spasm: Secondary | ICD-10-CM | POA: Diagnosis not present

## 2016-10-08 DIAGNOSIS — Q870 Congenital malformation syndromes predominantly affecting facial appearance: Secondary | ICD-10-CM | POA: Diagnosis not present

## 2016-10-08 DIAGNOSIS — G894 Chronic pain syndrome: Secondary | ICD-10-CM

## 2016-10-08 MED ORDER — ZOLPIDEM TARTRATE 10 MG PO TABS: ORAL_TABLET | ORAL | 3 refills | Status: DC

## 2016-10-08 MED ORDER — MORPHINE SULFATE ER 100 MG PO TBCR: 100.0000 mg | EXTENDED_RELEASE_TABLET | Freq: Two times a day (BID) | ORAL | 0 refills | Status: DC

## 2016-10-08 NOTE — Progress Notes (Signed)
Subjective:    Patient ID: Kenneth Farrell, male    DOB: 1970/12/31, 46 y.o.   MRN: [id number]  HPI: [name] is a 46 year old male who returns for follow up appointment for chronic pain and medication refill. He states his pain is located in his left shoulder, upper-lower back radiating into right lower extremity laterally and right hip pain. He rates his pain 7. His current exercise regime is walking.  He's working at [employer] three days a week.  Pain Inventory Average Pain 6 Pain Right Now 7 My pain is intermittent, constant, sharp, burning, dull, stabbing, tingling and aching  In the last 24 hours, has pain interfered with the following? General activity 7 Relation with others 7 Enjoyment of life 7 What TIME of day is your pain at its worst? All Sleep (in general) Fair  Pain is worse with: walking, bending, sitting, inactivity, standing, unsure and some activites Pain improves with: rest, heat/ice, therapy/exercise, pacing activities and medication Relief from Meds: 5  Mobility walk without assistance how many minutes can you walk? 10-15 ability to climb steps?  yes do you drive?  yes  Function employed # of hrs/week 25 disabled: date disabled 1993  Neuro/Psych numbness tingling spasms anxiety  Prior Studies Any changes since last visit?  no  Physicians involved in your care Any changes since last visit?  no   Family History  Problem Relation Age of Onset  . Hypertension Mother   . Hypertension Father    Social History   Social History  . Marital status: Single    Spouse name: N/A  . Number of children: N/A  . Years of education: N/A   Social History Main Topics  . Smoking status: Former Smoker    Quit date: 06/27/2011  . Smokeless tobacco: Never Used  . Alcohol use Not on file  . Drug use: Unknown  . Sexual activity: Not on file   Other Topics Concern  . Not on file   Social History Narrative  . No narrative on file   Past  Surgical History:  Procedure Laterality Date  . HERNIA REPAIR    . SPINE SURGERY    . urinary tract surgery     Past Medical History:  Diagnosis Date  . Anxiety   . Chronic pain syndrome   . 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 Facet syndrome, lumbar   . Kyphoscoliosis and scoliosis   . Lumbago    BP (!) 160/95   Pulse (!) 107   Resp 16   SpO2 96%   Opioid Risk Score:   Fall Risk Score:  `1  Depression screen PHQ 2/9  Depression screen [facility] 2/9 04/10/2016 10/24/2015 06/13/2015 05/01/2015 01/04/2015  Decreased Interest 0 0 0 0 0  Down, Depressed, Hopeless 0 0 0 0 0  PHQ - 2 Score 0 0 0 0 0  Altered sleeping - - - - 0  Tired, decreased energy - - - - 0  Change in appetite - - - - 1  Feeling bad or failure about yourself  - - - - 0  Trouble concentrating - - - - 0  Moving slowly or fidgety/restless - - - - 0  Suicidal thoughts - - - - 0  PHQ-9 Score - - - - 1    Review of Systems  Constitutional: Negative.   HENT: Negative.   Eyes: Negative.   Respiratory: Negative.   Cardiovascular: Negative.   Gastrointestinal: Negative.  Endocrine: Negative.   Genitourinary: Negative.   Musculoskeletal: Negative.   Allergic/Immunologic: Negative.   Neurological: Negative.   Hematological: Negative.   Psychiatric/Behavioral: Negative.   All other systems reviewed and are negative.      Objective:   Physical Exam  Constitutional: He is oriented to person, place, and time. He appears well-developed and well-nourished.  HENT:  Head: Normocephalic and atraumatic.  Neck: Normal range of motion. Neck supple.  Cardiovascular: Normal rate and regular rhythm.   Pulmonary/Chest: Effort normal and breath sounds normal.  Musculoskeletal:  Normal Muscle Bulk and Muscle Testing Reveals: Upper Extremities: Full ROM and Muscle Strength 5/5 Thoracic Paraspinal Tenderness: T-1-T-3 Mainly Left Side Lumbar Paraspinal Tenderness: L-3-L-5 Right Greater Trochanteric  Tenderness Lower Extremities: Full ROM and Muscle Strength 5/5 Arises from Table Slowly Narrow Based Gait   Neurological: He is alert and oriented to person, place, and time.  Skin: Skin is warm and dry.  Psychiatric: He has a normal mood and affect.  Nursing note and vitals reviewed.         Assessment & Plan:  1. 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  2. Chronic Back Pain: MS Contin is effective for pain management and makes a great difference in his quality of Life. Continue using Skelaxin for muscle spasm and Pamelor for neuropathy. Also recommended alternating heat and ice for shoulder pain.  Refilled: MS Contin 100mg  #60,---use one BID. Second script given for the following month. We will continue the opioid monitoring program, this consists of regular clinic visits, examinations, urine drug screen, pill counts as well as use of [place] Controlled Substance Reporting System. 3. Anxiety: Continue Xanax 4. Insomnia: Continue Ambien  20 minutes of face to face patient care time was spent during this visit. All questions was encouraged and answered.   F/U in 2 months

## 2016-10-13 LAB — 6-ACETYLMORPHINE,TOXASSURE ADD
6-ACETYLMORPHINE: NEGATIVE
6-acetylmorphine: NOT DETECTED ng/mg creat

## 2016-10-13 LAB — TOXASSURE SELECT,+ANTIDEPR,UR

## 2016-10-14 NOTE — Progress Notes (Signed)
Urine drug screen for this encounter is consistent for prescribed medication 

## 2016-10-23 ENCOUNTER — Other Ambulatory Visit: Payer: Self-pay | Admitting: Physical Medicine & Rehabilitation

## 2016-11-21 ENCOUNTER — Other Ambulatory Visit: Payer: Self-pay | Admitting: Physical Medicine & Rehabilitation

## 2016-12-03 ENCOUNTER — Encounter: Payer: Self-pay | Admitting: Registered Nurse

## 2016-12-03 ENCOUNTER — Encounter: Payer: Medicare Other | Attending: Physical Medicine & Rehabilitation | Admitting: Registered Nurse

## 2016-12-03 VITALS — BP 153/118 | HR 109

## 2016-12-03 DIAGNOSIS — F419 Anxiety disorder, unspecified: Secondary | ICD-10-CM | POA: Insufficient documentation

## 2016-12-03 DIAGNOSIS — Z5181 Encounter for therapeutic drug level monitoring: Secondary | ICD-10-CM

## 2016-12-03 DIAGNOSIS — M549 Dorsalgia, unspecified: Secondary | ICD-10-CM | POA: Insufficient documentation

## 2016-12-03 DIAGNOSIS — M419 Scoliosis, unspecified: Secondary | ICD-10-CM | POA: Diagnosis not present

## 2016-12-03 DIAGNOSIS — I10 Essential (primary) hypertension: Secondary | ICD-10-CM

## 2016-12-03 DIAGNOSIS — G8929 Other chronic pain: Secondary | ICD-10-CM | POA: Insufficient documentation

## 2016-12-03 DIAGNOSIS — F411 Generalized anxiety disorder: Secondary | ICD-10-CM | POA: Diagnosis not present

## 2016-12-03 DIAGNOSIS — Z79899 Other long term (current) drug therapy: Secondary | ICD-10-CM

## 2016-12-03 DIAGNOSIS — Z87891 Personal history of nicotine dependence: Secondary | ICD-10-CM | POA: Insufficient documentation

## 2016-12-03 DIAGNOSIS — G894 Chronic pain syndrome: Secondary | ICD-10-CM | POA: Diagnosis not present

## 2016-12-03 DIAGNOSIS — M62838 Other muscle spasm: Secondary | ICD-10-CM | POA: Diagnosis not present

## 2016-12-03 DIAGNOSIS — Q870 Congenital malformation syndromes predominantly affecting facial appearance: Secondary | ICD-10-CM

## 2016-12-03 DIAGNOSIS — G629 Polyneuropathy, unspecified: Secondary | ICD-10-CM | POA: Insufficient documentation

## 2016-12-03 DIAGNOSIS — M47816 Spondylosis without myelopathy or radiculopathy, lumbar region: Secondary | ICD-10-CM | POA: Diagnosis not present

## 2016-12-03 MED ORDER — MORPHINE SULFATE ER 100 MG PO TBCR: 100.0000 mg | EXTENDED_RELEASE_TABLET | Freq: Two times a day (BID) | ORAL | 0 refills | Status: DC
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
## 2016-12-04 ENCOUNTER — Encounter: Payer: Self-pay | Admitting: Registered Nurse

## 2016-12-05 ENCOUNTER — Telehealth: Payer: Self-pay | Admitting: Registered Nurse

## 2016-12-05 NOTE — Telephone Encounter (Signed)
On 12/05/2016 the NCCSR was reviewed no conflict was seen on the [facility] Controlled Substance Reporting System with multiple prescribers. Kenneth Farrell has a signed narcotic contract with our office. If there were any discrepancies this would have been reported to his physician.

## 2017-01-22 ENCOUNTER — Other Ambulatory Visit: Payer: Self-pay | Admitting: Registered Nurse

## 2017-01-28 ENCOUNTER — Encounter: Payer: Self-pay | Admitting: Physical Medicine & Rehabilitation

## 2017-01-28 ENCOUNTER — Encounter: Payer: Medicare Other | Attending: Physical Medicine & Rehabilitation | Admitting: Physical Medicine & Rehabilitation

## 2017-01-28 VITALS — BP 189/125 | HR 110 | Resp 14

## 2017-01-28 DIAGNOSIS — M419 Scoliosis, unspecified: Secondary | ICD-10-CM | POA: Insufficient documentation

## 2017-01-28 DIAGNOSIS — G894 Chronic pain syndrome: Secondary | ICD-10-CM | POA: Diagnosis not present

## 2017-01-28 DIAGNOSIS — F419 Anxiety disorder, unspecified: Secondary | ICD-10-CM | POA: Insufficient documentation

## 2017-01-28 DIAGNOSIS — G8929 Other chronic pain: Secondary | ICD-10-CM | POA: Diagnosis not present

## 2017-01-28 DIAGNOSIS — Z79899 Other long term (current) drug therapy: Secondary | ICD-10-CM | POA: Diagnosis not present

## 2017-01-28 DIAGNOSIS — Q870 Congenital malformation syndromes predominantly affecting facial appearance: Secondary | ICD-10-CM | POA: Insufficient documentation

## 2017-01-28 DIAGNOSIS — Z87891 Personal history of nicotine dependence: Secondary | ICD-10-CM | POA: Insufficient documentation

## 2017-01-28 DIAGNOSIS — G629 Polyneuropathy, unspecified: Secondary | ICD-10-CM | POA: Insufficient documentation

## 2017-01-28 DIAGNOSIS — M549 Dorsalgia, unspecified: Secondary | ICD-10-CM | POA: Insufficient documentation

## 2017-01-28 DIAGNOSIS — Z5181 Encounter for therapeutic drug level monitoring: Secondary | ICD-10-CM | POA: Diagnosis not present

## 2017-01-28 DIAGNOSIS — M62838 Other muscle spasm: Secondary | ICD-10-CM | POA: Diagnosis not present

## 2017-01-28 MED ORDER — MORPHINE SULFATE ER 100 MG PO TBCR: 100.0000 mg | EXTENDED_RELEASE_TABLET | Freq: Two times a day (BID) | ORAL | 0 refills | Status: DC

## 2017-01-28 NOTE — Progress Notes (Signed)
Subjective:    Patient ID: Kenneth Farrell, male    DOB: 1970/12/28, 46 y.o.   MRN: [id number]  HPI   Kenneth Farrell is back in regard to his chronic pain. He remains happily married. His pain levels have been consistent and controlled. He remains on ms contin for pain control. He has remained consistent with his counts and our program.   He denies constipation.   He recently had an abscessed tooth which was treated. His pain levels were temporarily increased obviously with the infection. The tooth was removed Friday.  A recent stress test was negative.     Pain Inventory Average Pain 7 Pain Right Now 7 My pain is intermittent, constant, sharp, burning, dull, stabbing, tingling and aching  In the last 24 hours, has pain interfered with the following? General activity 7 Relation with others 7 Enjoyment of life 7 What TIME of day is your pain at its worst? all Sleep (in general) Fair  Pain is worse with: walking, bending, sitting, inactivity, standing, unsure and some activites Pain improves with: rest, heat/ice, therapy/exercise, pacing activities, medication and TENS Relief from Meds: 4  Mobility walk without assistance how many minutes can you walk? 10 ability to climb steps?  yes do you drive?  yes  Function employed # of hrs/week . disabled: date disabled .  Neuro/Psych tingling trouble walking spasms anxiety  Prior Studies Any changes since last visit?  no  Physicians involved in your care Any changes since last visit?  no   Family History  Problem Relation Age of Onset  . Hypertension Mother   . Hypertension Father    Social History   Social History  . Marital status: Single    Spouse name: N/A  . Number of children: N/A  . Years of education: N/A   Social History Main Topics  . Smoking status: Former Smoker    Quit date: 06/27/2011  . Smokeless tobacco: Never Used  . Alcohol use None  . Drug use: Unknown  . Sexual activity: Not Asked   Other  Topics Concern  . None   Social History Narrative  . None   Past Surgical History:  Procedure Laterality Date  . HERNIA REPAIR    . SPINE SURGERY    . urinary tract surgery     Past Medical History:  Diagnosis Date  . Anxiety   . Chronic pain syndrome   . 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 Facet syndrome, lumbar (HCC)   . Kyphoscoliosis and scoliosis   . Lumbago    BP (!) 189/125 (BP Location: Left Arm, Patient Position: Sitting, Cuff Size: Normal)   Pulse (!) 110   Resp 14   SpO2 94%   Opioid Risk Score:   Fall Risk Score:  `1  Depression screen PHQ 2/9  Depression screen [facility] 2/9 04/10/2016 10/24/2015 06/13/2015 05/01/2015 01/04/2015  Decreased Interest 0 0 0 0 0  Down, Depressed, Hopeless 0 0 0 0 0  PHQ - 2 Score 0 0 0 0 0  Altered sleeping - - - - 0  Tired, decreased energy - - - - 0  Change in appetite - - - - 1  Feeling bad or failure about yourself  - - - - 0  Trouble concentrating - - - - 0  Moving slowly or fidgety/restless - - - - 0  Suicidal thoughts - - - - 0  PHQ-9 Score - - - - 1    Review of  Systems  Constitutional: Negative.   HENT: Negative.   Eyes: Negative.   Respiratory: Negative.   Cardiovascular: Negative.   Gastrointestinal: Negative.   Endocrine: Negative.   Genitourinary: Negative.   Musculoskeletal: Positive for arthralgias, back pain, gait problem, myalgias and neck pain.       Spasms   Skin: Negative.   Allergic/Immunologic: Negative.   Neurological:       Tingling  Hematological: Negative.   Psychiatric/Behavioral: The patient is nervous/anxious.   All other systems reviewed and are negative.      Objective:   Physical Exam  Constitutional: He appears well-developed and well-nourished.  HENT:  Head: Normocephalic and atraumatic.  Eyes: Conjunctivae and EOM are normal. Pupils are equal, round, and reactive to light.  Neck: Normal range of motion. Neck supple.  Cardiovascular: RRR  Pulmonary/Chest: effort  normal.  Abdominal: Soft. Bowel sounds are normal.  Musculoskeletal: Normal range of motion.  Gait normal. Right sided hypoplasia present Neurological: He is alert. He has normal strength and normal reflexes.  Psychiatric: He has a normal mood and affect. His behavior is normal. Cognitively intact    Assessment & Plan:  ASSESSMENT:  1. 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  2. Chronic associated mid to low back pain.    PLAN:  1. I refilled his MS Contin 100 mg q.12 h. #60 with a second refill for next month. We will continue the opioid monitoring program, this consists of regular clinic visits, examinations, urine drug screen, pill counts as well as use of [place] Controlled Substance Reporting System. NCCSRS was reviewed today.  A UDS was performed today  2. Return visit in about 2 months' time. Ten minutes of face to face patient care time were spent during this visit. All questions were encouraged and answered.

## 2017-01-28 NOTE — Patient Instructions (Signed)
PLEASE FEEL FREE TO CALL OUR OFFICE WITH ANY PROBLEMS OR QUESTIONS ([phone])      

## 2017-02-01 ENCOUNTER — Other Ambulatory Visit: Payer: Self-pay | Admitting: Physical Medicine & Rehabilitation

## 2017-02-02 LAB — 6-ACETYLMORPHINE,TOXASSURE ADD
6-ACETYLMORPHINE: NEGATIVE
6-ACETYLMORPHINE: NOT DETECTED ng/mg{creat}

## 2017-02-02 LAB — TOXASSURE SELECT,+ANTIDEPR,UR

## 2017-02-03 ENCOUNTER — Telehealth: Payer: Self-pay | Admitting: *Deleted

## 2017-02-03 NOTE — Telephone Encounter (Signed)
Urine drug screen for this encounter is consistent for prescribed medication. Kenneth Farrell has never been on oxycodone or tramadol so it is expected for it to be absent.  I am not sure where the lab got those medications.  All other meds are present.

## 2017-03-18 ENCOUNTER — Other Ambulatory Visit: Payer: Self-pay | Admitting: Physical Medicine & Rehabilitation

## 2017-04-08 ENCOUNTER — Encounter: Payer: Self-pay | Admitting: Physical Medicine & Rehabilitation

## 2017-04-08 ENCOUNTER — Encounter: Payer: Medicare Other | Attending: Physical Medicine & Rehabilitation | Admitting: Physical Medicine & Rehabilitation

## 2017-04-08 VITALS — BP 153/121 | HR 134 | Resp 14

## 2017-04-08 DIAGNOSIS — G8929 Other chronic pain: Secondary | ICD-10-CM | POA: Diagnosis not present

## 2017-04-08 DIAGNOSIS — M62838 Other muscle spasm: Secondary | ICD-10-CM | POA: Diagnosis not present

## 2017-04-08 DIAGNOSIS — Z5181 Encounter for therapeutic drug level monitoring: Secondary | ICD-10-CM | POA: Diagnosis not present

## 2017-04-08 DIAGNOSIS — G629 Polyneuropathy, unspecified: Secondary | ICD-10-CM | POA: Diagnosis not present

## 2017-04-08 DIAGNOSIS — G894 Chronic pain syndrome: Secondary | ICD-10-CM | POA: Diagnosis not present

## 2017-04-08 DIAGNOSIS — M549 Dorsalgia, unspecified: Secondary | ICD-10-CM | POA: Insufficient documentation

## 2017-04-08 DIAGNOSIS — Q870 Congenital malformation syndromes predominantly affecting facial appearance: Secondary | ICD-10-CM | POA: Diagnosis not present

## 2017-04-08 DIAGNOSIS — Z87891 Personal history of nicotine dependence: Secondary | ICD-10-CM | POA: Insufficient documentation

## 2017-04-08 DIAGNOSIS — M419 Scoliosis, unspecified: Secondary | ICD-10-CM | POA: Insufficient documentation

## 2017-04-08 DIAGNOSIS — F419 Anxiety disorder, unspecified: Secondary | ICD-10-CM | POA: Insufficient documentation

## 2017-04-08 DIAGNOSIS — Z79899 Other long term (current) drug therapy: Secondary | ICD-10-CM | POA: Diagnosis not present

## 2017-04-08 MED ORDER — MORPHINE SULFATE ER 100 MG PO TBCR: 100.0000 mg | EXTENDED_RELEASE_TABLET | Freq: Two times a day (BID) | ORAL | 0 refills | Status: DC

## 2017-04-08 NOTE — Progress Notes (Signed)
Subjective:    Patient ID: Kenneth Farrell, male    DOB: [birth year], 46 y.o.   MRN: [id number]  HPI   Kenneth Farrell is here in follow up of his chronic pain. He states things have been fairly stable. He stays active as possible and still is working. His pain medication remains effective.   He stopped taking miralax as it was making his stool too loose. He uses colace and prn miralax with good results now.    Pain Inventory Average Pain 7 Pain Right Now 6 My pain is intermittent, constant, sharp, burning, dull, stabbing, tingling and aching  In the last 24 hours, has pain interfered with the following? General activity 6 Relation with others 7 Enjoyment of life 7 What TIME of day is your pain at its worst? all Sleep (in general) all  Pain is worse with: walking, bending, sitting, inactivity, standing and some activites Pain improves with: rest, heat/ice, therapy/exercise, pacing activities and medication Relief from Meds: 4  Mobility walk without assistance how many minutes can you walk? 10 ability to climb steps?  yes do you drive?  yes  Function employed # of hrs/week . 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  Neuro/Psych tingling trouble walking spasms anxiety  Prior Studies Any changes since last visit?  no  Physicians involved in your care Any changes since last visit?  no   Family History  Problem Relation Age of Onset  . Hypertension Mother   . Hypertension Father    Social History   Social History  . Marital status: Single    Spouse name: N/A  . Number of children: N/A  . Years of education: N/A   Social History Main Topics  . Smoking status: Former Smoker    Quit date: 06/27/2011  . Smokeless tobacco: Never Used  . Alcohol use None  . Drug use: Unknown  . Sexual activity: Not Asked   Other Topics Concern  . None   Social History Narrative  . None   Past Surgical History:  Procedure Laterality Date  . HERNIA REPAIR    . SPINE  SURGERY    . urinary tract surgery     Past Medical History:  Diagnosis Date  . Anxiety   . Chronic pain syndrome   . 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 Facet syndrome, lumbar (HCC)   . Kyphoscoliosis and scoliosis   . Lumbago    BP (!) 153/121 (BP Location: Right Arm, Patient Position: Sitting, Cuff Size: Normal)   Pulse (!) 134   Resp 14   SpO2 97%   Opioid Risk Score:   Fall Risk Score:  `1  Depression screen PHQ 2/9  Depression screen [facility] 2/9 04/10/2016 10/24/2015 06/13/2015 05/01/2015 01/04/2015  Decreased Interest 0 0 0 0 0  Down, Depressed, Hopeless 0 0 0 0 0  PHQ - 2 Score 0 0 0 0 0  Altered sleeping - - - - 0  Tired, decreased energy - - - - 0  Change in appetite - - - - 1  Feeling bad or failure about yourself  - - - - 0  Trouble concentrating - - - - 0  Moving slowly or fidgety/restless - - - - 0  Suicidal thoughts - - - - 0  PHQ-9 Score - - - - 1    Review of Systems  Constitutional: Negative.   HENT: Negative.   Eyes: Negative.   Respiratory: Negative.   Cardiovascular: Negative.  Gastrointestinal: Negative.   Endocrine: Negative.   Genitourinary: Negative.   Musculoskeletal: Positive for arthralgias, back pain, gait problem, myalgias, neck pain and neck stiffness.       Spasms  Skin: Negative.   Allergic/Immunologic: Negative.   Neurological:       Tingling  Hematological: Negative.   Psychiatric/Behavioral: The patient is nervous/anxious.   All other systems reviewed and are negative.      Objective:   Physical Exam  Constitutional: He appears well-developed and well-nourished.  HENT:  Head: Normocephalic and atraumatic.  Eyes: EOMI  Neck: Normal range of motion. Neck supple.  Cardiovascular: RRR without murmur. No JVD   Pulmonary/Chest: normal effort.  Abdominal: Soft. Bowel sounds are normal.  Musculoskeletal: Normal range of motion.  Gait normal. Right sided hypoplasia per baseline Neurological: He is alert. He has normal  strength and normal reflexes.  Psychiatric: pleasant and appropriate    Assessment & Plan:  ASSESSMENT:  1. 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  2. Chronic associated mid to low back pain.   PLAN:  1. I refilled his MS Contin 100 mg q.12 h. #60 with a second refill for next month. We will continue the opioid monitoring program, this consists of regular clinic visits, examinations, urine drug screen, pill counts as well as use of [place] Controlled Substance Reporting System. NCCSRS was reviewed today.   -A UDS was performed today 2. HTN: he states bp wsa ok at PCP last week. Will defer to PCP here 3. Return visit in about 2 months' time. Ten minutes of face to face patient care time were spent during this visit. All questions were encouraged and answered.

## 2017-04-08 NOTE — Patient Instructions (Signed)
PLEASE FEEL FREE TO CALL OUR OFFICE WITH ANY PROBLEMS OR QUESTIONS ([phone])      

## 2017-04-14 LAB — 6-ACETYLMORPHINE,TOXASSURE ADD
6-ACETYLMORPHINE: NEGATIVE
6-acetylmorphine: NOT DETECTED ng/mg creat

## 2017-04-14 LAB — TOXASSURE SELECT,+ANTIDEPR,UR

## 2017-04-16 ENCOUNTER — Telehealth: Payer: Self-pay | Admitting: *Deleted

## 2017-04-16 NOTE — Telephone Encounter (Signed)
Urine drug screen for this encounter is consistent for prescribed medication 

## 2017-04-21 ENCOUNTER — Other Ambulatory Visit: Payer: Self-pay | Admitting: Registered Nurse

## 2017-06-03 ENCOUNTER — Encounter: Payer: Medicare Other | Attending: Physical Medicine & Rehabilitation | Admitting: Physical Medicine & Rehabilitation

## 2017-06-03 ENCOUNTER — Encounter: Payer: Self-pay | Admitting: Physical Medicine & Rehabilitation

## 2017-06-03 VITALS — BP 159/108 | HR 118

## 2017-06-03 DIAGNOSIS — M549 Dorsalgia, unspecified: Secondary | ICD-10-CM | POA: Insufficient documentation

## 2017-06-03 DIAGNOSIS — Q870 Congenital malformation syndromes predominantly affecting facial appearance: Secondary | ICD-10-CM | POA: Insufficient documentation

## 2017-06-03 DIAGNOSIS — M62838 Other muscle spasm: Secondary | ICD-10-CM | POA: Insufficient documentation

## 2017-06-03 DIAGNOSIS — G8929 Other chronic pain: Secondary | ICD-10-CM | POA: Insufficient documentation

## 2017-06-03 DIAGNOSIS — M47816 Spondylosis without myelopathy or radiculopathy, lumbar region: Secondary | ICD-10-CM | POA: Diagnosis not present

## 2017-06-03 DIAGNOSIS — M419 Scoliosis, unspecified: Secondary | ICD-10-CM | POA: Diagnosis not present

## 2017-06-03 DIAGNOSIS — G894 Chronic pain syndrome: Secondary | ICD-10-CM | POA: Diagnosis not present

## 2017-06-03 DIAGNOSIS — Z87891 Personal history of nicotine dependence: Secondary | ICD-10-CM | POA: Insufficient documentation

## 2017-06-03 DIAGNOSIS — F419 Anxiety disorder, unspecified: Secondary | ICD-10-CM | POA: Insufficient documentation

## 2017-06-03 DIAGNOSIS — G629 Polyneuropathy, unspecified: Secondary | ICD-10-CM | POA: Insufficient documentation

## 2017-06-03 DIAGNOSIS — Z79899 Other long term (current) drug therapy: Secondary | ICD-10-CM | POA: Insufficient documentation

## 2017-06-03 MED ORDER — CYCLOBENZAPRINE HCL 5 MG PO TABS: 5.0000 mg | ORAL_TABLET | Freq: Three times a day (TID) | ORAL | 4 refills | Status: DC | PRN

## 2017-06-03 MED ORDER — CYCLOBENZAPRINE HCL 5 MG PO TABS: 5.0000 mg | ORAL_TABLET | Freq: Three times a day (TID) | ORAL | Status: DC | PRN

## 2017-06-03 MED ORDER — ZOLPIDEM TARTRATE 10 MG PO TABS
ORAL_TABLET | ORAL | 3 refills | Status: DC
Start: 2017-06-03 — End: 2017-09-25

## 2017-06-03 MED ORDER — MORPHINE SULFATE ER 100 MG PO TBCR: 100.0000 mg | EXTENDED_RELEASE_TABLET | Freq: Two times a day (BID) | ORAL | 0 refills | Status: DC

## 2017-06-03 NOTE — Progress Notes (Signed)
Subjective:    Patient ID: Kenneth Farrell, male    DOB: [birth year], 46 y.o.   MRN: [id number]  HPI   Kenneth Farrell is here in follow up of his chronic pain. He states that his pain is a little more intense. He attributes it to work and the fact that he's often getting in and out of cars. The pain seems to be primarily in his low back, right greater than left with some radiation into the right hip. He's working about 25 hours a week currently. His BP remains high, but his primary is following it. He states that stress tends to run his bp higher.    He remains on ms contin 100mg  q12 for pain relief. He is interested in a change perhaps to something different.   His bowels are regular.   Pain Inventory Average Pain 7 Pain Right Now 7 My pain is intermittent, constant, sharp, burning, dull, stabbing, tingling and aching  In the last 24 hours, has pain interfered with the following? General activity 7 Relation with others 7 Enjoyment of life 7 What TIME of day is your pain at its worst? all Sleep (in general) Fair  Pain is worse with: walking, bending, sitting, inactivity, standing, unsure and some activites Pain improves with: rest, heat/ice, therapy/exercise, pacing activities and medication Relief from Meds: 3  Mobility walk without assistance ability to climb steps?  yes do you drive?  yes  Function employed # of hrs/week 25  Neuro/Psych numbness tingling trouble walking spasms anxiety  Prior Studies Any changes since last visit?  no  Physicians involved in your care Any changes since last visit?  no   Family History  Problem Relation Age of Onset  . Hypertension Mother   . Hypertension Father    Social History   Social History  . Marital status: Single    Spouse name: N/A  . Number of children: N/A  . Years of education: N/A   Social History Main Topics  . Smoking status: Former Smoker    Quit date: 06/27/2011  . Smokeless tobacco: Never Used  . Alcohol use  Not on file  . Drug use: Unknown  . Sexual activity: Not on file   Other Topics Concern  . Not on file   Social History Narrative  . No narrative on file   Past Surgical History:  Procedure Laterality Date  . HERNIA REPAIR    . SPINE SURGERY    . urinary tract surgery     Past Medical History:  Diagnosis Date  . Anxiety   . Chronic pain syndrome   . 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 Facet syndrome, lumbar (HCC)   . Kyphoscoliosis and scoliosis   . Lumbago    BP (!) 159/108   Pulse (!) 118   SpO2 97%   Opioid Risk Score:   Fall Risk Score:  `1  Depression screen PHQ 2/9  Depression screen [facility] 2/9 04/10/2016 10/24/2015 06/13/2015 05/01/2015 01/04/2015  Decreased Interest 0 0 0 0 0  Down, Depressed, Hopeless 0 0 0 0 0  PHQ - 2 Score 0 0 0 0 0  Altered sleeping - - - - 0  Tired, decreased energy - - - - 0  Change in appetite - - - - 1  Feeling bad or failure about yourself  - - - - 0  Trouble concentrating - - - - 0  Moving slowly or fidgety/restless - - - - 0  Suicidal  thoughts - - - - 0  PHQ-9 Score - - - - 1     Review of Systems  Constitutional: Negative.   HENT: Negative.   Eyes: Negative.   Respiratory: Negative.   Cardiovascular: Negative.   Gastrointestinal: Negative.   Endocrine: Negative.   Genitourinary: Negative.   Musculoskeletal: Negative.   Skin: Negative.   Allergic/Immunologic: Negative.   Neurological: Negative.   Hematological: Negative.   Psychiatric/Behavioral: Negative.   All other systems reviewed and are negative.      Objective:   Physical Exam  Constitutional: He appears well-developed and well-nourished.  HENT:  Head: Normocephalic and atraumatic.  Eyes: EOMI  Neck: Normal range of motion. Neck supple.  Cardiovascular: RRR  Pulmonary/Chest: normal effort.  Abdominal: Soft. Bowel sounds are normal.  Musculoskeletal: lumbar ROM limited. Was TTP in right lower lumbar spine. Forward and right side-bending  seemed to make it worse. Extension also was uncomfortable. He has a forward posture. Numerous scars around spine are noted.  Gait normal. Right sided hypoplasia per baseline.  Neurological: pt with normal strength and motor exam.  Psychiatric: pleasant and appropriate as always    Assessment & Plan:  ASSESSMENT:  1. 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  2. Chronic associated mid to low back pain.   PLAN:  1. I refilled his MS Contin 100 mg q.12 h. Refilled today #60.  We will continue the opioid monitoring program, this consists of regular clinic visits, examinations, urine drug screen, pill counts as well as use of [place] Controlled Substance Reporting System. NCCSRS was reviewed today.        -we discussed options moving forward for his pain. I think we need to start with xrays of his back as it's been quite a while since anyone has imaged his back. If all looks reasonable, I would recommend a course of PT to address ROM/core/HEP, potentially any modalities  -trial of flexeril 5mg  q8 prn for spasms  -we could consider a change to another opiate. Asked him to consult with his insurance company  -he's not a candidate for NSAID's given his bp and GI history         2. HTN: per primary 3. Return visit in about 1 months' time. of face to face patient care time were spent during this visit. All questions were encouraged and answered. Greater than 50% of time during this encounter was spent counseling patient/family in regard to therapeutic options.

## 2017-06-03 NOTE — Patient Instructions (Signed)
1. CHECK YOUR DRUG FORMULARLY   PLEASE FEEL FREE TO CALL OUR OFFICE WITH ANY PROBLEMS OR QUESTIONS [phone])

## 2017-07-01 ENCOUNTER — Telehealth: Payer: Self-pay | Admitting: Physical Medicine & Rehabilitation

## 2017-07-01 ENCOUNTER — Ambulatory Visit (INDEPENDENT_AMBULATORY_CARE_PROVIDER_SITE_OTHER): Payer: Medicare Other

## 2017-07-01 DIAGNOSIS — M5441 Lumbago with sciatica, right side: Secondary | ICD-10-CM | POA: Diagnosis not present

## 2017-07-01 DIAGNOSIS — G894 Chronic pain syndrome: Secondary | ICD-10-CM

## 2017-07-01 DIAGNOSIS — M47816 Spondylosis without myelopathy or radiculopathy, lumbar region: Secondary | ICD-10-CM

## 2017-07-01 NOTE — Telephone Encounter (Signed)
Back xrays without substantial acute abnormalities. He has the chronic, severe scoliosis, and his hardware appears intact and in place.   As I mentioned before, we could pursue PT to address lumbar and LE ROM, strength, posture, modalities, and HEP. Let me know if he'd like to proceed.

## 2017-07-02 NOTE — Telephone Encounter (Signed)
Contacted patient. Passed on Dr. Rosalyn Charters message.  Patient said he would call back tomorrow with a decision on whether to proceed with physical therapy

## 2017-07-08 ENCOUNTER — Encounter: Payer: Medicare Other | Attending: Physical Medicine & Rehabilitation | Admitting: Physical Medicine & Rehabilitation

## 2017-07-08 ENCOUNTER — Encounter: Payer: Self-pay | Admitting: Physical Medicine & Rehabilitation

## 2017-07-08 VITALS — BP 165/112 | HR 112 | Resp 14

## 2017-07-08 DIAGNOSIS — Z79899 Other long term (current) drug therapy: Secondary | ICD-10-CM | POA: Insufficient documentation

## 2017-07-08 DIAGNOSIS — G629 Polyneuropathy, unspecified: Secondary | ICD-10-CM | POA: Diagnosis not present

## 2017-07-08 DIAGNOSIS — Q870 Congenital malformation syndromes predominantly affecting facial appearance: Secondary | ICD-10-CM | POA: Diagnosis not present

## 2017-07-08 DIAGNOSIS — G8929 Other chronic pain: Secondary | ICD-10-CM | POA: Insufficient documentation

## 2017-07-08 DIAGNOSIS — M549 Dorsalgia, unspecified: Secondary | ICD-10-CM | POA: Insufficient documentation

## 2017-07-08 DIAGNOSIS — G894 Chronic pain syndrome: Secondary | ICD-10-CM | POA: Diagnosis not present

## 2017-07-08 DIAGNOSIS — Z87891 Personal history of nicotine dependence: Secondary | ICD-10-CM | POA: Diagnosis not present

## 2017-07-08 DIAGNOSIS — M419 Scoliosis, unspecified: Secondary | ICD-10-CM | POA: Insufficient documentation

## 2017-07-08 DIAGNOSIS — M62838 Other muscle spasm: Secondary | ICD-10-CM | POA: Diagnosis not present

## 2017-07-08 DIAGNOSIS — M47816 Spondylosis without myelopathy or radiculopathy, lumbar region: Secondary | ICD-10-CM

## 2017-07-08 DIAGNOSIS — F419 Anxiety disorder, unspecified: Secondary | ICD-10-CM | POA: Insufficient documentation

## 2017-07-08 MED ORDER — MORPHINE SULFATE ER 100 MG PO TBCR: 100.0000 mg | EXTENDED_RELEASE_TABLET | Freq: Two times a day (BID) | ORAL | 0 refills | Status: DC

## 2017-07-08 NOTE — Patient Instructions (Signed)
PLEASE FEEL FREE TO CALL OUR OFFICE WITH ANY PROBLEMS OR QUESTIONS ([phone])      

## 2017-07-08 NOTE — Progress Notes (Signed)
Subjective:    Patient ID: Kenneth Farrell, male    DOB: [birth year], 46 y.o.   MRN: [id number]  HPI Kenneth Farrell is here in follow up of his chronic pain. He has had increased pain in his low back. He went for xrays which revealed his ongoing scoliosis and hardwrare. His pain is stable from last visit. He feels that the flexeril helps at times with flares of his pain.   He has decided that he needs to lose weight and increase his exercise. His wife has been supportive.   He remains on ms contin as we've been prescribing him.   Pain Inventory Average Pain 7 Pain Right Now 7 My pain is intermittent, constant, sharp, burning, dull, stabbing, tingling and aching  In the last 24 hours, has pain interfered with the following? General activity 7 Relation with others 7 Enjoyment of life 7 What TIME of day is your pain at its worst? all Sleep (in general) Fair  Pain is worse with: walking, bending, sitting, inactivity, standing, unsure and some activites Pain improves with: rest, heat/ice, pacing activities and medication Relief from Meds: 4  Mobility walk without assistance how many minutes can you walk? 10 ability to climb steps?  yes do you drive?  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  no  Physicians involved in your care Any changes since last visit?  no   Family History  Problem Relation Age of Onset  . Hypertension Mother   . Hypertension Father    Social History   Social History  . Marital status: Single    Spouse name: N/A  . Number of children: N/A  . Years of education: N/A   Social History Main Topics  . Smoking status: Former Smoker    Quit date: 06/27/2011  . Smokeless tobacco: Never Used  . Alcohol use None  . Drug use: Unknown  . Sexual activity: Not Asked   Other Topics Concern  . None    Social History Narrative  . None   Past Surgical History:  Procedure Laterality Date  . HERNIA REPAIR    . SPINE SURGERY    . urinary tract surgery     Past Medical History:  Diagnosis Date  . Anxiety   . Chronic pain syndrome   . 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 Facet syndrome, lumbar   . Kyphoscoliosis and scoliosis   . Lumbago    BP (!) 165/112 (BP Location: Left Arm, Patient Position: Sitting, Cuff Size: Normal)   Pulse (!) 112   Resp 14   SpO2 95%   Opioid Risk Score:   Fall Risk Score:  `1  Depression screen PHQ 2/9  Depression screen [facility] 2/9 04/10/2016 10/24/2015 06/13/2015 05/01/2015 01/04/2015  Decreased Interest 0 0 0 0 0  Down, Depressed, Hopeless 0 0 0 0 0  PHQ - 2 Score 0 0 0 0 0  Altered sleeping - - - - 0  Tired, decreased energy - - - - 0  Change in appetite - - - - 1  Feeling bad or failure about yourself  - - - - 0  Trouble concentrating - - - - 0  Moving slowly or fidgety/restless - - - - 0  Suicidal thoughts - - - - 0  PHQ-9 Score - - - - 1  Review of Systems  Constitutional: Negative.   HENT: Negative.   Eyes: Negative.   Respiratory: Negative.   Cardiovascular: Negative.   Gastrointestinal: Negative.   Endocrine: Negative.   Genitourinary: Negative.   Musculoskeletal: Positive for arthralgias, back pain, gait problem, myalgias, neck pain and neck stiffness.       Spasms  Skin: Negative.   Allergic/Immunologic: Negative.   Neurological:       Tingling  Hematological: Negative.   Psychiatric/Behavioral: The patient is nervous/anxious.        Objective:   Physical Exam Constitutional: He appears well-developed and well-nourished.  HENT:  Head: Normocephalic and atraumatic.  Eyes: EOMI Neck: Normal range of motion. Neck supple.  Cardiovascular:  RRR  Pulmonary/Chest: normal effort.  Abdominal: Soft. Bowel sounds are normal.  Musculoskeletal: lumbar remains ROM limited. Most tender in the right lower lumbar  quadrant. . Forward flexion tender.   Gait stable. Right sided hypoplasia presnt.  Neurological: pt with normal strength and motor exam.  Psychiatric: pleasant and appropriate    Assessment & Plan:  ASSESSMENT:  1. 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  2. Chronic associated mid to low back pain.   PLAN:  1. I refilled his MS Contin 100 mg q.12 h. Refilled today #60.  We will continue the opioid monitoring program, this consists of regular clinic visits, examinations, routine drug screening, pill counts as well as use of [place] Controlled Substance Reporting System. NCCSRS was reviewed today.                -reviewed plan and will make referral to outpt PT to address posture, ROM, strengthening, HEp             -continue flexeril  q8 prn for spasms             -can consider a change to another opiate.              -he's not a candidate for NSAID's given his bp and GI history  2. HTN: per primary 3. Return visit in about 2 months' time. of face to face patient care time were spent during this visit. All questions were encouraged and answered.

## 2017-07-15 ENCOUNTER — Other Ambulatory Visit: Payer: Self-pay | Admitting: Registered Nurse

## 2017-07-16 ENCOUNTER — Other Ambulatory Visit: Payer: Self-pay | Admitting: Physical Medicine & Rehabilitation

## 2017-09-03 ENCOUNTER — Other Ambulatory Visit: Payer: Self-pay

## 2017-09-03 ENCOUNTER — Encounter: Payer: Medicare Other | Attending: Physical Medicine & Rehabilitation | Admitting: Registered Nurse

## 2017-09-03 ENCOUNTER — Encounter: Payer: Self-pay | Admitting: Registered Nurse

## 2017-09-03 ENCOUNTER — Telehealth: Payer: Self-pay | Admitting: Registered Nurse

## 2017-09-03 VITALS — BP 156/82 | HR 116 | Resp 14

## 2017-09-03 DIAGNOSIS — F419 Anxiety disorder, unspecified: Secondary | ICD-10-CM | POA: Insufficient documentation

## 2017-09-03 DIAGNOSIS — G629 Polyneuropathy, unspecified: Secondary | ICD-10-CM | POA: Diagnosis not present

## 2017-09-03 DIAGNOSIS — F411 Generalized anxiety disorder: Secondary | ICD-10-CM | POA: Diagnosis not present

## 2017-09-03 DIAGNOSIS — G894 Chronic pain syndrome: Secondary | ICD-10-CM | POA: Diagnosis not present

## 2017-09-03 DIAGNOSIS — M5416 Radiculopathy, lumbar region: Secondary | ICD-10-CM

## 2017-09-03 DIAGNOSIS — M419 Scoliosis, unspecified: Secondary | ICD-10-CM | POA: Insufficient documentation

## 2017-09-03 DIAGNOSIS — Z79899 Other long term (current) drug therapy: Secondary | ICD-10-CM | POA: Diagnosis not present

## 2017-09-03 DIAGNOSIS — Z87891 Personal history of nicotine dependence: Secondary | ICD-10-CM | POA: Diagnosis not present

## 2017-09-03 DIAGNOSIS — M549 Dorsalgia, unspecified: Secondary | ICD-10-CM | POA: Insufficient documentation

## 2017-09-03 DIAGNOSIS — M62838 Other muscle spasm: Secondary | ICD-10-CM

## 2017-09-03 DIAGNOSIS — G8929 Other chronic pain: Secondary | ICD-10-CM | POA: Insufficient documentation

## 2017-09-03 DIAGNOSIS — Q870 Congenital malformation syndromes predominantly affecting facial appearance: Secondary | ICD-10-CM

## 2017-09-03 DIAGNOSIS — M47816 Spondylosis without myelopathy or radiculopathy, lumbar region: Secondary | ICD-10-CM | POA: Diagnosis not present

## 2017-09-03 DIAGNOSIS — G4700 Insomnia, unspecified: Secondary | ICD-10-CM

## 2017-09-03 MED ORDER — MORPHINE SULFATE ER 100 MG PO TBCR: 100.0000 mg | EXTENDED_RELEASE_TABLET | Freq: Two times a day (BID) | ORAL | 0 refills | Status: DC

## 2017-09-03 NOTE — Telephone Encounter (Signed)
On 09/03/2017 the  NCCSR was reviewed no conflict was seen [facility] the [facility] [facility] with multiple prescribers. [name] has a signed narcotic contract with our office. If there were any discrepancies this would have been reported to his physician.

## 2017-09-03 NOTE — Progress Notes (Signed)
Subjective:    Patient ID: Kenneth Farrell, male    DOB: May 24, 1971, 46 y.o.   MRN: [id number]  HPI: [name] is a 46 year old male who returns for follow up appointment for chronic pain and medication refill. He states his pain is located in his neck, lower back radiating into his right hip and right lower extremity. He rates his pain 5. His current exercise regime is walking.  He arrived hypertensive, blood pressure re-checked, 156/82. Also reports he is compliant with his anti-hypertensive medication, keeps a blood pressure log and checks his blood pressure three times a day. PCP following he states.   Mr. Carola Farrell Morphine equivalent is 200.00 MME. He is also prescribed Alprazolam. We have discussed the black box warning of using opioids and benzodiazepines. I highlighted the dangers of using these drugs together and discussed the adverse events including respiratory suppression, overdose, cognitive impairment and importance of  compliance with current regimen. He  verbalizes understanding, we will continue to monitor and adjust as indicated.    Mr. Carola Farrell will began a slow weaning of Alprazolam the goal will be TID as needed, he verbalizes understanding.    He's working at [employer] three days a week.  Pain Inventory Average Pain 6 Pain Right Now 5 My pain is intermittent, constant, sharp, burning, dull, stabbing, tingling and aching  In the last 24 hours, has pain interfered with the following? General activity 7 Relation with others 7 Enjoyment of life 7 What TIME of day is your pain at its worst? morning, daytime, evening, and night Sleep (in general) Fair  Pain is worse with: walking, bending, sitting, inactivity, standing, unsure and some activites Pain improves with: rest, heat/ice, therapy/exercise, pacing activities and medication Relief from Meds: 5  Mobility walk without assistance how many minutes can you walk? 10-15 ability to climb steps?  yes do you  drive?  yes  Function employed # of hrs/week 25hrs disabled: date disabled [id number]  Neuro/Psych tingling trouble walking spasms anxiety  Prior Studies Any changes since last visit?  no  Physicians involved in your care Any changes since last visit?  no   Family History  Problem Relation Age of Onset  . Hypertension Mother   . Hypertension Father    Social History   Socioeconomic History  . Marital status: Single    Spouse name: None  . Number of children: None  . Years of education: None  . Highest education level: None  Social Needs  . Financial resource strain: None  . Food insecurity - worry: None  . Food insecurity - inability: None  . Transportation needs - medical: None  . Transportation needs - non-medical: None  Occupational History  . None  Tobacco Use  . Smoking status: Former Smoker    Last attempt to quit: 06/27/2011    Years since quitting: 6.1  . Smokeless tobacco: Never Used  Substance and Sexual Activity  . Alcohol use: None  . Drug use: None  . Sexual activity: None  Other Topics Concern  . None  Social History Narrative  . None   Past Surgical History:  Procedure Laterality Date  . HERNIA REPAIR    . SPINE SURGERY    . urinary tract surgery     Past Medical History:  Diagnosis Date  . Anxiety   . Chronic pain syndrome   . 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 Facet syndrome, lumbar   . Kyphoscoliosis and scoliosis   .  Lumbago    BP (!) 159/108 (BP Location: Left Arm, Patient Position: Sitting, Cuff Size: Normal)   Pulse (!) 116   Resp 14   SpO2 93%   Opioid Risk Score:   Fall Risk Score:  `1  Depression screen PHQ 2/9  Depression screen [facility] 2/9 04/10/2016 10/24/2015 06/13/2015 05/01/2015 01/04/2015  Decreased Interest 0 0 0 0 0  Down, Depressed, Hopeless 0 0 0 0 0  PHQ - 2 Score 0 0 0 0 0  Altered sleeping - - - - 0  Tired, decreased energy - - - - 0  Change in appetite - - - - 1  Feeling bad or failure about  yourself  - - - - 0  Trouble concentrating - - - - 0  Moving slowly or fidgety/restless - - - - 0  Suicidal thoughts - - - - 0  PHQ-9 Score - - - - 1   Review of Systems  Constitutional: Negative.   HENT: Negative.   Eyes: Negative.   Respiratory: Negative.   Cardiovascular: Negative.   Gastrointestinal: Negative.   Endocrine: Negative.   Genitourinary: Negative.   Musculoskeletal: Positive for arthralgias, back pain, myalgias, neck pain and neck stiffness.       Spasms   Skin: Negative.   Allergic/Immunologic: Negative.   Neurological: Negative.        Tingling   Hematological: Negative.   Psychiatric/Behavioral: The patient is nervous/anxious.        Objective:   Physical Exam  Constitutional: He is oriented to person, place, and time. He appears well-developed and well-nourished.  HENT:  Head: Normocephalic and atraumatic.  Neck: Normal range of motion. Neck supple.  Cervical Paraspinal Tenderness: C-5-C-6  Cardiovascular: Normal rate and regular rhythm.  Pulmonary/Chest: Effort normal and breath sounds normal.  Musculoskeletal:  Normal Muscle Bulk and Muscle Testing Reveals: Upper Extremities: Full ROM and Muscle Strength 5/5 Lumbar Paraspinal Tenderness: L-3-L-5 Lower Extremities: Full ROM and Muscle Strength 5/5 Arises from Table with ease Narrow Based Gait  Neurological: He is alert and oriented to person, place, and time.  Skin: Skin is warm and dry.  Psychiatric: He has a normal mood and affect.  Nursing note and vitals reviewed.         Assessment & Plan:  1. 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 09/03/2017 2.Right Lumbar Radiculitis/ Chronic Back Pain/ Lumbar Spondylosis: MS Contin is effective for pain management and makes a great difference in his quality of Life. Continue using Pamelor for neuropathy. Also recommended alternating heat and ice for shoulder pain. 09/03/2017 Refilled:MS Contin 100mg  #60,---use one tablet  every 12 hours . 09/03/2017 We will continue the opioid monitoring program, this consists of regular clinic visits, examinations, urine drug screen, pill counts as well as use of [place] Controlled Substance Reporting System. 3. Anxiety: Continue Xanax. 09/03/2017 4. Insomnia: Continue Ambien. 09/03/2017 5. Muscle Spasm: Continue Flexeril. 09/03/2017  20 minutes of face to face patient care time was spent during this visit. All questions were encouraged and answered.  F/U in 1 month

## 2017-09-09 ENCOUNTER — Ambulatory Visit: Payer: Medicare Other | Admitting: Physical Medicine & Rehabilitation

## 2017-09-25 ENCOUNTER — Other Ambulatory Visit: Payer: Self-pay

## 2017-09-25 DIAGNOSIS — Q870 Congenital malformation syndromes predominantly affecting facial appearance: Principal | ICD-10-CM

## 2017-09-25 MED ORDER — ZOLPIDEM TARTRATE 10 MG PO TABS: ORAL_TABLET | ORAL | 3 refills | Status: DC

## 2017-10-16 ENCOUNTER — Other Ambulatory Visit: Payer: Self-pay | Admitting: Registered Nurse

## 2017-10-28 ENCOUNTER — Telehealth: Payer: Self-pay | Admitting: Registered Nurse

## 2017-10-28 DIAGNOSIS — M47816 Spondylosis without myelopathy or radiculopathy, lumbar region: Secondary | ICD-10-CM

## 2017-10-28 DIAGNOSIS — G894 Chronic pain syndrome: Secondary | ICD-10-CM

## 2017-10-28 DIAGNOSIS — Q870 Congenital malformation syndromes predominantly affecting facial appearance: Principal | ICD-10-CM

## 2017-10-28 MED ORDER — MORPHINE SULFATE ER 100 MG PO TBCR: 100.0000 mg | EXTENDED_RELEASE_TABLET | Freq: Two times a day (BID) | ORAL | 0 refills | Status: DC

## 2017-10-28 NOTE — Telephone Encounter (Signed)
Placed a call to Kenneth Farrell regarding his December prescription of MS Contin. Mr. Deruiter reports he was in [place] for December, he picked up his MS Contin in [place], [place] on 12/26, 2018.

## 2017-10-29 ENCOUNTER — Encounter: Payer: Medicare Other | Admitting: Registered Nurse

## 2017-10-30 ENCOUNTER — Telehealth: Payer: Self-pay

## 2017-10-30 NOTE — Telephone Encounter (Signed)
Patients wife called, julie, stated the pharmacist called them and told them they need a prior auth for his morphine.  Prior auth started today 10-30-17

## 2017-10-31 NOTE — Telephone Encounter (Signed)
Prior auth returned today 10-31-17 as being approved until 10-06-2018, patients wife notified.

## 2017-11-17 ENCOUNTER — Other Ambulatory Visit: Payer: Self-pay | Admitting: Physical Medicine & Rehabilitation

## 2017-12-03 ENCOUNTER — Encounter: Payer: Medicare Other | Attending: Registered Nurse | Admitting: Registered Nurse

## 2017-12-03 ENCOUNTER — Encounter: Payer: Self-pay | Admitting: Registered Nurse

## 2017-12-03 VITALS — BP 159/99 | HR 114 | Resp 14

## 2017-12-03 DIAGNOSIS — M47816 Spondylosis without myelopathy or radiculopathy, lumbar region: Secondary | ICD-10-CM

## 2017-12-03 DIAGNOSIS — G35 Multiple sclerosis: Secondary | ICD-10-CM | POA: Insufficient documentation

## 2017-12-03 DIAGNOSIS — G4700 Insomnia, unspecified: Secondary | ICD-10-CM | POA: Insufficient documentation

## 2017-12-03 DIAGNOSIS — M4726 Other spondylosis with radiculopathy, lumbar region: Secondary | ICD-10-CM | POA: Insufficient documentation

## 2017-12-03 DIAGNOSIS — F419 Anxiety disorder, unspecified: Secondary | ICD-10-CM | POA: Insufficient documentation

## 2017-12-03 DIAGNOSIS — Z5181 Encounter for therapeutic drug level monitoring: Secondary | ICD-10-CM

## 2017-12-03 DIAGNOSIS — M419 Scoliosis, unspecified: Secondary | ICD-10-CM | POA: Insufficient documentation

## 2017-12-03 DIAGNOSIS — G629 Polyneuropathy, unspecified: Secondary | ICD-10-CM | POA: Insufficient documentation

## 2017-12-03 DIAGNOSIS — M545 Low back pain: Secondary | ICD-10-CM | POA: Insufficient documentation

## 2017-12-03 DIAGNOSIS — I10 Essential (primary) hypertension: Secondary | ICD-10-CM | POA: Insufficient documentation

## 2017-12-03 DIAGNOSIS — Z79899 Other long term (current) drug therapy: Secondary | ICD-10-CM | POA: Diagnosis not present

## 2017-12-03 DIAGNOSIS — G894 Chronic pain syndrome: Secondary | ICD-10-CM | POA: Insufficient documentation

## 2017-12-03 DIAGNOSIS — F411 Generalized anxiety disorder: Secondary | ICD-10-CM | POA: Diagnosis not present

## 2017-12-03 DIAGNOSIS — Q870 Congenital malformation syndromes predominantly affecting facial appearance: Secondary | ICD-10-CM | POA: Diagnosis not present

## 2017-12-03 DIAGNOSIS — Z760 Encounter for issue of repeat prescription: Secondary | ICD-10-CM | POA: Diagnosis present

## 2017-12-03 DIAGNOSIS — M62838 Other muscle spasm: Secondary | ICD-10-CM | POA: Insufficient documentation

## 2017-12-03 DIAGNOSIS — Z87891 Personal history of nicotine dependence: Secondary | ICD-10-CM | POA: Diagnosis not present

## 2017-12-03 DIAGNOSIS — M5416 Radiculopathy, lumbar region: Secondary | ICD-10-CM | POA: Diagnosis not present

## 2017-12-03 MED ORDER — MORPHINE SULFATE ER 100 MG PO TBCR: 100.0000 mg | EXTENDED_RELEASE_TABLET | Freq: Two times a day (BID) | ORAL | 0 refills | Status: DC

## 2017-12-03 NOTE — Progress Notes (Signed)
Subjective:    Patient ID: Kenneth Farrell, male    DOB: [birth year], 47 y.o.   MRN: [id number]  HPI: Kenneth Farrell is a 47 year old male who returns for follow up appointment for chronic pain and medication refill. He states his pain is located in his lower back radiating into his right lower extremity. He rates his pain 5. His current exercise regime is walking. He arrived hypertensive, blood pressure re-checked, 159/99. Also reports he is compliant with his anti-hypertensive medication, keeps a blood pressure log and checks his blood pressure three times a day. PCP following he reports.   Kenneth Farrell Morphine equivalent is 200.00 MME/ 353.33 30 day MME. He is also prescribed Alprazolam. We have reviewed the black box warning of using opioids and benzodiazepines. I highlighted the dangers of using these drugs together and discussed the adverse events including respiratory suppression, overdose, cognitive impairment and importance of  compliance with current regimen. He  verbalizes understanding, we will continue to monitor and adjust as indicated.    He's working at [employer] three days a week.  Pain Inventory Average Pain 6 Pain Right Now 5 My pain is intermittent, constant, sharp, burning, dull, stabbing, tingling and aching  In the last 24 hours, has pain interfered with the following? General activity 7 Relation with others 7 Enjoyment of life 7 What TIME of day is your pain at its worst? morning, daytime, evening, and night Sleep (in general) Fair  Pain is worse with: walking, bending, sitting, inactivity, standing, unsure and some activites Pain improves with: rest, heat/ice, therapy/exercise, pacing activities and medication Relief from Meds: 5  Mobility walk without assistance how many minutes can you walk? 10-15 ability to climb steps?  yes do you drive?  yes  Function employed # of hrs/week 25hrs disabled: date disabled 74  Neuro/Psych tingling trouble  walking spasms anxiety  Prior Studies Any changes since last visit?  no  Physicians involved in your care Any changes since last visit?  no   Family History  Problem Relation Age of Onset  . Hypertension Mother   . Hypertension Father    Social History   Socioeconomic History  . Marital status: Single    Spouse name: None  . Number of children: None  . Years of education: None  . Highest education level: None  Social Needs  . Financial resource strain: None  . Food insecurity - worry: None  . Food insecurity - inability: None  . Transportation needs - medical: None  . Transportation needs - non-medical: None  Occupational History  . None  Tobacco Use  . Smoking status: Former Smoker    Last attempt to quit: 06/27/2011    Years since quitting: 6.4  . Smokeless tobacco: Never Used  Substance and Sexual Activity  . Alcohol use: None  . Drug use: None  . Sexual activity: None  Other Topics Concern  . None  Social History Narrative  . None   Past Surgical History:  Procedure Laterality Date  . HERNIA REPAIR    . SPINE SURGERY    . urinary tract surgery     Past Medical History:  Diagnosis Date  . Anxiety   . Chronic pain syndrome   . 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 Facet syndrome, lumbar   . Kyphoscoliosis and scoliosis   . Lumbago    BP (!) 154/111 (BP Location: Right Arm, Patient Position: Sitting, Cuff Size: Large)   Pulse Marland Kitchen)  114   Resp 14   SpO2 91%   Opioid Risk Score:   Fall Risk Score:  `1  Depression screen PHQ 2/9  Depression screen [facility] 2/9 04/10/2016 10/24/2015 06/13/2015 05/01/2015 01/04/2015  Decreased Interest 0 0 0 0 0  Down, Depressed, Hopeless 0 0 0 0 0  PHQ - 2 Score 0 0 0 0 0  Altered sleeping - - - - 0  Tired, decreased energy - - - - 0  Change in appetite - - - - 1  Feeling bad or failure about yourself  - - - - 0  Trouble concentrating - - - - 0  Moving slowly or fidgety/restless - - - - 0  Suicidal thoughts -  - - - 0  PHQ-9 Score - - - - 1   Review of Systems  Constitutional: Negative.   HENT: Negative.   Eyes: Negative.   Respiratory: Negative.   Cardiovascular: Negative.   Gastrointestinal: Negative.   Endocrine: Negative.   Genitourinary: Negative.   Musculoskeletal: Positive for arthralgias, back pain, gait problem, myalgias, neck pain and neck stiffness.       Spasms   Skin: Negative.   Allergic/Immunologic: Negative.   Neurological:       Tingling   Hematological: Negative.   Psychiatric/Behavioral: The patient is nervous/anxious.        Objective:   Physical Exam  Constitutional: He is oriented to person, place, and time. He appears well-developed and well-nourished.  HENT:  Head: Normocephalic and atraumatic.  Neck: Normal range of motion. Neck supple.  Cervical Paraspinal Tenderness: C-5-C-6  Cardiovascular: Normal rate and regular rhythm.  Pulmonary/Chest: Effort normal and breath sounds normal.  Musculoskeletal:  Normal Muscle Bulk and Muscle Testing Reveals: Upper Extremities: Full ROM and Muscle Strength 5/5 Lumbar Paraspinal Tenderness: L-3-l-5 Lower Extremities: Full ROM and Muscle Strength 5/5 Arises from Table with ease Narrow Based Gait  Neurological: He is alert and oriented to person, place, and time.  Skin: Skin is warm and dry.  Psychiatric: He has a normal mood and affect.  Nursing note and vitals reviewed.         Assessment & Plan:  1. 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 12/03/2017 2.Right Lumbar Radiculitis/ Chronic Back Pain/ Lumbar Spondylosis: MS Contin is effective for pain management and makes a great difference in his quality of Life. Continue using Pamelor for neuropathy. Also recommended alternating heat and ice for shoulder pain. 12/03/2017 Refilled:MS Contin 100mg  #60,---use one tablet every 12 hours . 12/03/2017 We will continue the opioid monitoring program, this consists of regular clinic visits,  examinations, urine drug screen, pill counts as well as use of [place] Controlled Substance Reporting System. 3. Anxiety: Continue Xanax. 12/03/2017 4. Insomnia: Continue Ambien. 12/03/2017 5. Muscle Spasm: Continue Flexeril. 12/03/2017  20 minutes of face to face patient care time was spent during this visit. All questions were encouraged and answered.  F/U in 1 month

## 2017-12-09 LAB — 6-ACETYLMORPHINE,TOXASSURE ADD
6-ACETYLMORPHINE: NEGATIVE
6-acetylmorphine: NOT DETECTED ng/mg creat

## 2017-12-09 LAB — TOXASSURE SELECT,+ANTIDEPR,UR

## 2017-12-11 ENCOUNTER — Telehealth: Payer: Self-pay | Admitting: *Deleted

## 2017-12-11 NOTE — Telephone Encounter (Signed)
Urine drug screen for this encounter is consistent for prescribed medication 

## 2018-01-16 ENCOUNTER — Other Ambulatory Visit: Payer: Self-pay | Admitting: Physical Medicine & Rehabilitation

## 2018-01-21 ENCOUNTER — Other Ambulatory Visit: Payer: Self-pay

## 2018-01-21 ENCOUNTER — Encounter: Payer: Self-pay | Admitting: Physical Medicine & Rehabilitation

## 2018-01-21 ENCOUNTER — Encounter: Payer: Medicare Other | Attending: Registered Nurse | Admitting: Physical Medicine & Rehabilitation

## 2018-01-21 DIAGNOSIS — F419 Anxiety disorder, unspecified: Secondary | ICD-10-CM | POA: Insufficient documentation

## 2018-01-21 DIAGNOSIS — M419 Scoliosis, unspecified: Secondary | ICD-10-CM | POA: Diagnosis not present

## 2018-01-21 DIAGNOSIS — M62838 Other muscle spasm: Secondary | ICD-10-CM | POA: Diagnosis not present

## 2018-01-21 DIAGNOSIS — Q870 Congenital malformation syndromes predominantly affecting facial appearance: Secondary | ICD-10-CM | POA: Diagnosis not present

## 2018-01-21 DIAGNOSIS — M545 Low back pain: Secondary | ICD-10-CM | POA: Diagnosis not present

## 2018-01-21 DIAGNOSIS — Z87891 Personal history of nicotine dependence: Secondary | ICD-10-CM | POA: Diagnosis not present

## 2018-01-21 DIAGNOSIS — G35 Multiple sclerosis: Secondary | ICD-10-CM | POA: Insufficient documentation

## 2018-01-21 DIAGNOSIS — G4700 Insomnia, unspecified: Secondary | ICD-10-CM | POA: Insufficient documentation

## 2018-01-21 DIAGNOSIS — M47816 Spondylosis without myelopathy or radiculopathy, lumbar region: Secondary | ICD-10-CM | POA: Diagnosis not present

## 2018-01-21 DIAGNOSIS — M4726 Other spondylosis with radiculopathy, lumbar region: Secondary | ICD-10-CM | POA: Insufficient documentation

## 2018-01-21 DIAGNOSIS — G629 Polyneuropathy, unspecified: Secondary | ICD-10-CM | POA: Insufficient documentation

## 2018-01-21 DIAGNOSIS — G894 Chronic pain syndrome: Secondary | ICD-10-CM

## 2018-01-21 DIAGNOSIS — I10 Essential (primary) hypertension: Secondary | ICD-10-CM | POA: Insufficient documentation

## 2018-01-21 DIAGNOSIS — Z760 Encounter for issue of repeat prescription: Secondary | ICD-10-CM | POA: Insufficient documentation

## 2018-01-21 MED ORDER — MORPHINE SULFATE ER 100 MG PO TBCR: 100.0000 mg | EXTENDED_RELEASE_TABLET | Freq: Two times a day (BID) | ORAL | 0 refills | Status: DC

## 2018-01-21 NOTE — Progress Notes (Signed)
Subjective:    Patient ID: Kenneth Farrell, male    DOB: 1971/02/07, 47 y.o.   MRN: [id number]  HPI   [name] is here in follow up of his chronic pain.  He states his pain levels have been fairly stable. He has been affected by the pollen like a lot of other people have. He continues to work 25 hours a week and seems to like his job.   He continue to work with PT on his mechanics and posture.  He states that he is learned a lot about his and ways to stretch into go about activities at work and home.  He knows there are limitations in his as to what can be done given his condition.  For pain he remains on MS Contin 100 mg twice daily.  This seems to be working fairly well for him.  He also uses Flexeril 5 mg 3 times daily as needed for spasm.   Pain Inventory Average Pain 6 Pain Right Now 7 My pain is intermittent, constant, sharp, burning, dull, stabbing, tingling and aching  In the last 24 hours, has pain interfered with the following? General activity 7 Relation with others 7 Enjoyment of life 7 What TIME of day is your pain at its worst? all Sleep (in general) Fair  Pain is worse with: walking, bending, sitting, inactivity, standing, unsure and some activites Pain improves with: rest, heat/ice, therapy/exercise, pacing activities, medication and injections Relief from Meds: 5  Mobility walk without assistance how many minutes can you walk? 10 ability to climb steps?  yes do you drive?  yes  Function employed # of hrs/week 25 disabled: date disabled 1993  Neuro/Psych numbness tingling spasms anxiety  Prior Studies Any changes since last visit?  no  Physicians involved in your care Any changes since last visit?  no   Family History  Problem Relation Age of Onset  . Hypertension Mother   . Hypertension Father    Social History   Socioeconomic History  . Marital status: Single    Spouse name: Not on file  . Number of children: Not on file  . Years of  education: Not on file  . Highest education level: Not on file  Occupational History  . Not on file  Social Needs  . Financial resource strain: Not on file  . Food insecurity:    Worry: Not on file    Inability: Not on file  . Transportation needs:    Medical: Not on file    Non-medical: Not on file  Tobacco Use  . Smoking status: Former Smoker    Last attempt to quit: 06/27/2011    Years since quitting: 6.5  . Smokeless tobacco: Never Used  Substance and Sexual Activity  . Alcohol use: Not on file  . Drug use: Not on file  . Sexual activity: Not on file  Lifestyle  . Physical activity:    Days per week: Not on file    Minutes per session: Not on file  . Stress: Not on file  Relationships  . Social connections:    Talks on phone: Not on file    Gets together: Not on file    Attends religious service: Not on file    Active member of club or organization: Not on file    Attends meetings of clubs or organizations: Not on file    Relationship status: Not on file  Other Topics Concern  . Not on file  Social History Narrative  .  Not on file   Past Surgical History:  Procedure Laterality Date  . HERNIA REPAIR    . SPINE SURGERY    . urinary tract surgery     Past Medical History:  Diagnosis Date  . Anxiety   . Chronic pain syndrome   . 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 Facet syndrome, lumbar   . Kyphoscoliosis and scoliosis   . Lumbago    There were no vitals taken for this visit.  Opioid Risk Score:   Fall Risk Score:  `1  Depression screen PHQ 2/9  Depression screen [facility] 2/9 04/10/2016 10/24/2015 06/13/2015 05/01/2015 01/04/2015  Decreased Interest 0 0 0 0 0  Down, Depressed, Hopeless 0 0 0 0 0  PHQ - 2 Score 0 0 0 0 0  Altered sleeping - - - - 0  Tired, decreased energy - - - - 0  Change in appetite - - - - 1  Feeling bad or failure about yourself  - - - - 0  Trouble concentrating - - - - 0  Moving slowly or fidgety/restless - - - - 0  Suicidal  thoughts - - - - 0  PHQ-9 Score - - - - 1   Review of Systems  Constitutional: Negative.   HENT: Negative.   Eyes: Negative.   Respiratory: Negative.   Cardiovascular: Negative.   Gastrointestinal: Negative.   Endocrine: Negative.   Genitourinary: Negative.   Musculoskeletal: Negative.   Skin: Negative.   Allergic/Immunologic: Negative.   Neurological: Negative.   Hematological: Negative.   Psychiatric/Behavioral: Negative.   All other systems reviewed and are negative.      Objective:   Physical Exam  General: No acute distress HEENT: EOMI, oral membranes moist Cards: reg rate  Chest: normal effort Abdomen: Soft, NT, ND Skin: dry, intact Extremities: no edema Musculoskeletal: lumbar ROM stable.  Forward flexion tender as is extension. Right hemipelvis elevated. Levoscoliosis lumbar-thoracic spine.  Right facial/trunk hypoplasia  Neurological: pt with normal strength and motor exam.  Psychiatric: pleasant and appropriate    Assessment & Plan:  ASSESSMENT:  1. 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  2. Chronic associated mid to low back pain.     PLAN:  1. I refilled his MS Contin 100 mg q.12 h. Refilled today #60. We will continue the opioid monitoring program, this consists of regular clinic visits, examinations, routine drug screening, pill counts as well as use of [place] Controlled Substance Reporting System. NCCSRS was reviewed today.               -continue with outpt PT to completion. He is to transition to a HEP.      -needs to remember mechanics and ergonomics moving forward.  -continue flexeril 5mg  q8 prn for spasms  2. HTN: per primary 3. Return visit in about 2months' time with NP. of face to face patient care time were spent during this visit. All questions were encouraged and answered.

## 2018-01-21 NOTE — Patient Instructions (Signed)
PLEASE FEEL FREE TO CALL OUR OFFICE WITH ANY PROBLEMS OR QUESTIONS ([phone])      

## 2018-01-29 ENCOUNTER — Other Ambulatory Visit: Payer: Self-pay | Admitting: Physical Medicine & Rehabilitation

## 2018-01-29 DIAGNOSIS — Q870 Congenital malformation syndromes predominantly affecting facial appearance: Principal | ICD-10-CM

## 2018-03-16 ENCOUNTER — Other Ambulatory Visit: Payer: Self-pay | Admitting: Physical Medicine & Rehabilitation

## 2018-03-25 ENCOUNTER — Encounter: Payer: Medicare Other | Attending: Registered Nurse | Admitting: Registered Nurse

## 2018-03-25 ENCOUNTER — Encounter: Payer: Self-pay | Admitting: Registered Nurse

## 2018-03-25 VITALS — BP 145/91 | HR 106 | Ht 68.0 in | Wt 235.0 lb

## 2018-03-25 DIAGNOSIS — M545 Low back pain: Secondary | ICD-10-CM | POA: Insufficient documentation

## 2018-03-25 DIAGNOSIS — Z79899 Other long term (current) drug therapy: Secondary | ICD-10-CM

## 2018-03-25 DIAGNOSIS — M5416 Radiculopathy, lumbar region: Secondary | ICD-10-CM | POA: Diagnosis not present

## 2018-03-25 DIAGNOSIS — Z87891 Personal history of nicotine dependence: Secondary | ICD-10-CM | POA: Diagnosis not present

## 2018-03-25 DIAGNOSIS — F419 Anxiety disorder, unspecified: Secondary | ICD-10-CM | POA: Insufficient documentation

## 2018-03-25 DIAGNOSIS — I10 Essential (primary) hypertension: Secondary | ICD-10-CM | POA: Diagnosis not present

## 2018-03-25 DIAGNOSIS — M419 Scoliosis, unspecified: Secondary | ICD-10-CM | POA: Diagnosis not present

## 2018-03-25 DIAGNOSIS — Q870 Congenital malformation syndromes predominantly affecting facial appearance: Secondary | ICD-10-CM | POA: Diagnosis not present

## 2018-03-25 DIAGNOSIS — M47816 Spondylosis without myelopathy or radiculopathy, lumbar region: Secondary | ICD-10-CM

## 2018-03-25 DIAGNOSIS — M4726 Other spondylosis with radiculopathy, lumbar region: Secondary | ICD-10-CM | POA: Insufficient documentation

## 2018-03-25 DIAGNOSIS — F411 Generalized anxiety disorder: Secondary | ICD-10-CM | POA: Diagnosis not present

## 2018-03-25 DIAGNOSIS — G4700 Insomnia, unspecified: Secondary | ICD-10-CM | POA: Insufficient documentation

## 2018-03-25 DIAGNOSIS — G35 Multiple sclerosis: Secondary | ICD-10-CM | POA: Insufficient documentation

## 2018-03-25 DIAGNOSIS — Z5181 Encounter for therapeutic drug level monitoring: Secondary | ICD-10-CM

## 2018-03-25 DIAGNOSIS — M62838 Other muscle spasm: Secondary | ICD-10-CM | POA: Diagnosis not present

## 2018-03-25 DIAGNOSIS — G894 Chronic pain syndrome: Secondary | ICD-10-CM

## 2018-03-25 DIAGNOSIS — G629 Polyneuropathy, unspecified: Secondary | ICD-10-CM | POA: Diagnosis not present

## 2018-03-25 DIAGNOSIS — Z760 Encounter for issue of repeat prescription: Secondary | ICD-10-CM | POA: Insufficient documentation

## 2018-03-25 MED ORDER — ALPRAZOLAM 1 MG PO TABS: ORAL_TABLET | ORAL | 3 refills | Status: DC

## 2018-03-25 MED ORDER — MORPHINE SULFATE ER 100 MG PO TBCR: 100.0000 mg | EXTENDED_RELEASE_TABLET | Freq: Two times a day (BID) | ORAL | 0 refills | Status: DC

## 2018-03-25 MED ORDER — NORTRIPTYLINE HCL 50 MG PO CAPS: 100.0000 mg | ORAL_CAPSULE | Freq: Every day | ORAL | 2 refills | Status: DC

## 2018-03-25 NOTE — Progress Notes (Signed)
Subjective:    Patient ID: Kenneth Farrell, male    DOB: 23-Jan-1971, 47 y.o.   MRN: [id number]  HPI: [name] is a 47 year old male who returns for follow up appointment for chronic pain and medication refill. He states his pain is located in his neck, lower back radiating into his right lower extremity. He rates his pain 7. His current exercise regime is walking.   Kenneth Farrell Morphine equivalent is 200.00 MME.He is also prescribed Alprazolam. We have discussed the black box warning of using opioids and benzodiazepines. I highlighted the dangers of using these drugs together and discussed the adverse events including respiratory suppression, overdose, cognitive impairment and importance of compliance with current regimen. We will continue to monitor and adjust as indicated.   Last UDS was Performed on 12/03/2017, it was consistent.   Pain Inventory Average Pain 7 Pain Right Now 7 My pain is intermittent, constant, sharp, burning, dull, stabbing, tingling and aching  In the last 24 hours, has pain interfered with the following? General activity 7 Relation with others 7 Enjoyment of life 7 What TIME of day is your pain at its worst? all Sleep (in general) Fair  Pain is worse with: walking, bending, sitting, inactivity, standing, unsure and some activites Pain improves with: rest, heat/ice, therapy/exercise, pacing activities and medication Relief from Meds: 5  Mobility walk without assistance ability to climb steps?  yes do you drive?  yes  Function employed # of hrs/week 25 disabled: date disabled 79  Neuro/Psych numbness tingling trouble walking spasms anxiety  Prior Studies Any changes since last visit?  no  Physicians involved in your care Any changes since last visit?  no   Family History  Problem Relation Age of Onset  . Hypertension Mother   . Hypertension Father    Social History   Socioeconomic History  . Marital status: Single    Spouse  name: Not on file  . Number of children: Not on file  . Years of education: Not on file  . Highest education level: Not on file  Occupational History  . Not on file  Social Needs  . Financial resource strain: Not on file  . Food insecurity:    Worry: Not on file    Inability: Not on file  . Transportation needs:    Medical: Not on file    Non-medical: Not on file  Tobacco Use  . Smoking status: Former Smoker    Last attempt to quit: 06/27/2011    Years since quitting: 6.7  . Smokeless tobacco: Never Used  Substance and Sexual Activity  . Alcohol use: Not on file  . Drug use: Not on file  . Sexual activity: Not on file  Lifestyle  . Physical activity:    Days per week: Not on file    Minutes per session: Not on file  . Stress: Not on file  Relationships  . Social connections:    Talks on phone: Not on file    Gets together: Not on file    Attends religious service: Not on file    Active member of club or organization: Not on file    Attends meetings of clubs or organizations: Not on file    Relationship status: Not on file  Other Topics Concern  . Not on file  Social History Narrative  . Not on file   Past Surgical History:  Procedure Laterality Date  . HERNIA REPAIR    . SPINE  SURGERY    . urinary tract surgery     Past Medical History:  Diagnosis Date  . Anxiety   . Chronic pain syndrome   . 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 Facet syndrome, lumbar   . Kyphoscoliosis and scoliosis   . Lumbago    There were no vitals taken for this visit.  Opioid Risk Score:   Fall Risk Score:  `1  Depression screen PHQ 2/9  Depression screen [facility] 2/9 01/21/2018 04/10/2016 10/24/2015 06/13/2015 05/01/2015 01/04/2015  Decreased Interest 0 0 0 0 0 0  Down, Depressed, Hopeless 0 0 0 0 0 0  PHQ - 2 Score 0 0 0 0 0 0  Altered sleeping - - - - - 0  Tired, decreased energy - - - - - 0  Change in appetite - - - - - 1  Feeling bad or failure about yourself  - - - - - 0   Trouble concentrating - - - - - 0  Moving slowly or fidgety/restless - - - - - 0  Suicidal thoughts - - - - - 0  PHQ-9 Score - - - - - 1     Review of Systems  Constitutional: Negative.   HENT: Negative.   Eyes: Negative.   Respiratory: Negative.   Cardiovascular: Negative.   Gastrointestinal: Negative.   Endocrine: Negative.   Genitourinary: Negative.   Musculoskeletal: Positive for arthralgias, back pain, gait problem, myalgias, neck pain and neck stiffness.  Skin: Negative.   Allergic/Immunologic: Negative.   Neurological: Positive for numbness.  Hematological: Negative.   Psychiatric/Behavioral: The patient is nervous/anxious.   All other systems reviewed and are negative.      Objective:   Physical Exam  Constitutional: He is oriented to person, place, and time. He appears well-developed and well-nourished.  HENT:  Head: Normocephalic and atraumatic.  Neck: Normal range of motion. Neck supple.  Cervical Paraspinal Tenderness: C-5-C-6  Pulmonary/Chest: Effort normal and breath sounds normal.  Musculoskeletal:  Normal Muscle Bulk and Muscle Testing Reveals: Upper Extremities: Full ROM and Muscle Strength 5/5 Thoracic Paraspinal Tenderness: T-7-T-9 Lumbar Paraspinal Tenderness: L-3-L-5 Lower Extremities: Full ROM and Muscle Strength 5/5 Arises from chair with ease Narrow Based Gait   Neurological: He is alert and oriented to person, place, and time.  Skin: Skin is warm and dry.  Psychiatric: He has a normal mood and affect.  Nursing note and vitals reviewed.         Assessment & Plan:  1. 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 03/25/2018 2.Right Lumbar Radiculitis/ Chronic Back Pain/ Lumbar Spondylosis: MS Contin is effective for pain management and makes a great difference in his quality of Life. Continue using Pamelor for neuropathy. Also recommended alternating heat and ice for shoulder pain. 03/25/2018 Refilled:MS Contin 100mg   #60,---use one tablet every 12 hours . 03/25/2018 We will continue the opioid monitoring program, this consists of regular clinic visits, examinations, urine drug screen, pill counts as well as use of [place] Controlled Substance Reporting System. 3. Anxiety: Continue Xanax. 03/25/2018 4. Insomnia: Continue Ambien. 03/25/2018 5. Muscle Spasm: Continue Flexeril. 03/25/2018  20 minutes of face to face patient care time was spent during this visit. All questions were encouraged and answered.  F/U in 1 month

## 2018-05-25 ENCOUNTER — Encounter: Payer: Medicare Other | Admitting: Registered Nurse

## 2018-05-27 ENCOUNTER — Encounter: Payer: Self-pay | Admitting: Registered Nurse

## 2018-05-27 ENCOUNTER — Encounter: Payer: Medicare Other | Attending: Registered Nurse | Admitting: Registered Nurse

## 2018-05-27 VITALS — BP 123/74 | HR 108 | Ht 68.0 in | Wt 232.4 lb

## 2018-05-27 DIAGNOSIS — F419 Anxiety disorder, unspecified: Secondary | ICD-10-CM | POA: Insufficient documentation

## 2018-05-27 DIAGNOSIS — M47816 Spondylosis without myelopathy or radiculopathy, lumbar region: Secondary | ICD-10-CM | POA: Diagnosis not present

## 2018-05-27 DIAGNOSIS — G894 Chronic pain syndrome: Secondary | ICD-10-CM | POA: Diagnosis present

## 2018-05-27 DIAGNOSIS — M62838 Other muscle spasm: Secondary | ICD-10-CM | POA: Diagnosis not present

## 2018-05-27 DIAGNOSIS — F411 Generalized anxiety disorder: Secondary | ICD-10-CM

## 2018-05-27 DIAGNOSIS — M5416 Radiculopathy, lumbar region: Secondary | ICD-10-CM

## 2018-05-27 DIAGNOSIS — Z79899 Other long term (current) drug therapy: Secondary | ICD-10-CM | POA: Diagnosis not present

## 2018-05-27 DIAGNOSIS — G629 Polyneuropathy, unspecified: Secondary | ICD-10-CM | POA: Diagnosis not present

## 2018-05-27 DIAGNOSIS — Z760 Encounter for issue of repeat prescription: Secondary | ICD-10-CM | POA: Diagnosis present

## 2018-05-27 DIAGNOSIS — M4726 Other spondylosis with radiculopathy, lumbar region: Secondary | ICD-10-CM | POA: Insufficient documentation

## 2018-05-27 DIAGNOSIS — Z87891 Personal history of nicotine dependence: Secondary | ICD-10-CM | POA: Insufficient documentation

## 2018-05-27 DIAGNOSIS — M419 Scoliosis, unspecified: Secondary | ICD-10-CM | POA: Diagnosis not present

## 2018-05-27 DIAGNOSIS — Q870 Congenital malformation syndromes predominantly affecting facial appearance: Secondary | ICD-10-CM

## 2018-05-27 DIAGNOSIS — I10 Essential (primary) hypertension: Secondary | ICD-10-CM | POA: Diagnosis not present

## 2018-05-27 DIAGNOSIS — Z5181 Encounter for therapeutic drug level monitoring: Secondary | ICD-10-CM | POA: Diagnosis not present

## 2018-05-27 DIAGNOSIS — G4700 Insomnia, unspecified: Secondary | ICD-10-CM | POA: Diagnosis not present

## 2018-05-27 DIAGNOSIS — G35 Multiple sclerosis: Secondary | ICD-10-CM | POA: Insufficient documentation

## 2018-05-27 DIAGNOSIS — M545 Low back pain: Secondary | ICD-10-CM | POA: Insufficient documentation

## 2018-05-27 MED ORDER — ZOLPIDEM TARTRATE 10 MG PO TABS: ORAL_TABLET | ORAL | 3 refills | Status: DC

## 2018-05-27 MED ORDER — MORPHINE SULFATE ER 100 MG PO TBCR: 100.0000 mg | EXTENDED_RELEASE_TABLET | Freq: Two times a day (BID) | ORAL | 0 refills | Status: DC

## 2018-05-27 MED ORDER — MORPHINE SULFATE ER 100 MG PO TBCR
100.0000 mg | EXTENDED_RELEASE_TABLET | Freq: Two times a day (BID) | ORAL | 0 refills | Status: DC
Start: 2018-05-27 — End: 2018-07-29

## 2018-05-27 NOTE — Progress Notes (Signed)
Subjective:    Patient ID: Kenneth Farrell, male    DOB: January 10, 1971, 47 y.o.   MRN: [id number]  HPI: [name] is a 47 year old male who returns for follow up appointment for chronic pain and medication refill. He states his pain is located in his neck, lower back radiating into his right lower extremity and bilateral hips. He rates his pain 7.   Kenneth Farrell is 200.00 MME. He is also prescribed alprazolam. We have discussed the black box warning of using opioids and benzodiazepines. I highlighted the dangers of using these drugs together and discussed the adverse events including respiratory suppression, overdose, cognitive impairment and importance of compliance with current regimen. We will continue to monitor and adjust as indicated.   Last UDS was Performed on 12/03/2017, it was consistent. UDS ordered today.   Pain Inventory Average Pain 7 Pain Right Now 7 My pain is intermittent, constant, sharp, burning, dull, stabbing, tingling and aching  In the last 24 hours, has pain interfered with the following? General activity 7 Relation with others 7 Enjoyment of life 7 What TIME of day is your pain at its worst? all Sleep (in general) Fair  Pain is worse with: walking, bending, sitting, inactivity, standing, unsure, some activites and . Pain improves with: rest, heat/ice, therapy/exercise, pacing activities and medication Relief from Meds: 5  Mobility walk without assistance ability to climb steps?  yes do you drive?  yes  Function employed # of hrs/week 25 disabled: date disabled 45  Neuro/Psych numbness tingling trouble walking spasms anxiety  Prior Studies Any changes since last visit?  no  Physicians involved in your care Any changes since last visit?  no   Family History  Problem Relation Age of Onset  . Hypertension Mother   . Hypertension Father    Social History   Socioeconomic History  . Marital status: Single    Spouse  name: Not on file  . Number of children: Not on file  . Years of education: Not on file  . Highest education level: Not on file  Occupational History  . Not on file  Social Needs  . Financial resource strain: Not on file  . Food insecurity:    Worry: Not on file    Inability: Not on file  . Transportation needs:    Medical: Not on file    Non-medical: Not on file  Tobacco Use  . Smoking status: Former Smoker    Last attempt to quit: 06/27/2011    Years since quitting: 6.9  . Smokeless tobacco: Never Used  Substance and Sexual Activity  . Alcohol use: Not on file  . Drug use: Not on file  . Sexual activity: Not on file  Lifestyle  . Physical activity:    Days per week: Not on file    Minutes per session: Not on file  . Stress: Not on file  Relationships  . Social connections:    Talks on phone: Not on file    Gets together: Not on file    Attends religious service: Not on file    Active member of club or organization: Not on file    Attends meetings of clubs or organizations: Not on file    Relationship status: Not on file  Other Topics Concern  . Not on file  Social History Narrative  . Not on file   Past Surgical History:  Procedure Laterality Date  . HERNIA REPAIR    .  SPINE SURGERY    . urinary tract surgery     Past Medical History:  Diagnosis Date  . Anxiety   . Chronic pain syndrome   . 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 Facet syndrome, lumbar   . Kyphoscoliosis and scoliosis   . Lumbago    There were no vitals taken for this visit.  Opioid Risk Score:   Fall Risk Score:  `1  Depression screen PHQ 2/9  Depression screen [facility] 2/9 01/21/2018 04/10/2016 10/24/2015 06/13/2015 05/01/2015 01/04/2015  Decreased Interest 0 0 0 0 0 0  Down, Depressed, Hopeless 0 0 0 0 0 0  PHQ - 2 Score 0 0 0 0 0 0  Altered sleeping - - - - - 0  Tired, decreased energy - - - - - 0  Change in appetite - - - - - 1  Feeling bad or failure about yourself  - - - - - 0   Trouble concentrating - - - - - 0  Moving slowly or fidgety/restless - - - - - 0  Suicidal thoughts - - - - - 0  PHQ-9 Score - - - - - 1     Review of Systems  Constitutional: Negative.   HENT: Negative.   Eyes: Negative.   Respiratory: Negative.   Cardiovascular: Negative.   Gastrointestinal: Negative.   Endocrine: Negative.   Genitourinary: Negative.   Musculoskeletal: Positive for arthralgias, back pain, gait problem and myalgias.  Skin: Negative.   Allergic/Immunologic: Negative.   Neurological: Positive for numbness.  Hematological: Negative.   Psychiatric/Behavioral: The patient is nervous/anxious.   All other systems reviewed and are negative.      Objective:   Physical Exam  Constitutional: He is oriented to person, place, and time. He appears well-developed and well-nourished.  HENT:  Head: Normocephalic and atraumatic.  Neck: Normal range of motion. Neck supple.  Cardiovascular: Normal rate, regular rhythm and normal heart sounds.  Pulmonary/Chest: Effort normal and breath sounds normal.  Musculoskeletal:  Normal Muscle Bulk and Muscle Testing Reveals:  Upper Extremities: Full ROM and Muscle Strength 5/5 Lumbar Hypersensitivity Lower Extremities: Full ROM and Muscle Strength 5/5 Arises from chair with ease Narrow Based Gait  Neurological: He is alert and oriented to person, place, and time.  Skin: Skin is warm and dry.  Psychiatric: He has a normal mood and affect. His behavior is normal.  Nursing note and vitals reviewed.         Assessment & Plan:  1. 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 Continue using Pamelor for neuropathy. Also recommended alternating heat and ice for shoulder pain. 05/27/2018 Refilled:MS Contin 100mg  #60,---use one tablet every  12 hours. 05/27/2018 We will continue the opioid monitoring program, this consists of regular clinic visits, examinations, urine drug screen, pill counts as well as use of [place] Controlled Substance Reporting System. 3. Anxiety: Continue Xanax.05/27/2018 4. Insomnia: Continue Ambien.05/27/2018 5. Muscle Spasm: Continue Flexeril. 05/27/2018  20 minutes of face to face patient care time was spent during this visit. All questions were encouraged and answered.  F/U in 1 month

## 2018-06-03 ENCOUNTER — Telehealth: Payer: Self-pay | Admitting: *Deleted

## 2018-06-03 LAB — 6-ACETYLMORPHINE,TOXASSURE ADD
6-ACETYLMORPHINE: NEGATIVE
6-acetylmorphine: NOT DETECTED ng/mg creat

## 2018-06-03 LAB — TOXASSURE SELECT,+ANTIDEPR,UR

## 2018-06-03 NOTE — Telephone Encounter (Signed)
Urine drug screen for this encounter is consistent for prescribed medication 

## 2018-06-25 ENCOUNTER — Telehealth: Payer: Self-pay

## 2018-06-25 NOTE — Telephone Encounter (Signed)
Recieved e-mail from patients pharmacy stating the need for a prior auth for his morphine sulfate 100mg , started prior auth today 06-25-2018.

## 2018-06-26 ENCOUNTER — Telehealth: Payer: Self-pay | Admitting: *Deleted

## 2018-06-26 NOTE — Telephone Encounter (Signed)
Recieved approval of prior authorization today 06-26-2018 until 10-06-2018

## 2018-06-26 NOTE — Telephone Encounter (Signed)
Placed a call to Mrs. Cusic no answer. Placed a call to Mr. Casasola he states his Morphine went through. Placed a call to [facility] and it was verified his MS Contin was filled awaiting pick up. Bruce did a PA this morning.

## 2018-06-26 NOTE — Telephone Encounter (Signed)
Kenneth Farrell called and reports that [employer] ended 06/07/18 and he is covered byt a temporary plan until new begins 07/07/18.  The temp insurance will ONLY cover 7 day supply of his MS COntin. He has an Rx at the pharmacy but they cannot get the full 30 day.  His last fill was 05/27/18.  Please call Kenneth.  She has called back this morning wanting to get this straightened out.  Ph # [phone]

## 2018-07-13 ENCOUNTER — Other Ambulatory Visit: Payer: Self-pay | Admitting: Registered Nurse

## 2018-07-22 ENCOUNTER — Encounter: Payer: Medicare Other | Admitting: Physical Medicine & Rehabilitation

## 2018-07-29 ENCOUNTER — Encounter: Payer: Medicare Other | Attending: Registered Nurse | Admitting: Physical Medicine & Rehabilitation

## 2018-07-29 ENCOUNTER — Encounter: Payer: Self-pay | Admitting: Physical Medicine & Rehabilitation

## 2018-07-29 ENCOUNTER — Other Ambulatory Visit: Payer: Self-pay

## 2018-07-29 DIAGNOSIS — Z87891 Personal history of nicotine dependence: Secondary | ICD-10-CM | POA: Insufficient documentation

## 2018-07-29 DIAGNOSIS — Z760 Encounter for issue of repeat prescription: Secondary | ICD-10-CM | POA: Diagnosis present

## 2018-07-29 DIAGNOSIS — M62838 Other muscle spasm: Secondary | ICD-10-CM | POA: Insufficient documentation

## 2018-07-29 DIAGNOSIS — Q870 Congenital malformation syndromes predominantly affecting facial appearance: Secondary | ICD-10-CM | POA: Diagnosis not present

## 2018-07-29 DIAGNOSIS — M47816 Spondylosis without myelopathy or radiculopathy, lumbar region: Secondary | ICD-10-CM

## 2018-07-29 DIAGNOSIS — I10 Essential (primary) hypertension: Secondary | ICD-10-CM | POA: Insufficient documentation

## 2018-07-29 DIAGNOSIS — M419 Scoliosis, unspecified: Secondary | ICD-10-CM | POA: Diagnosis not present

## 2018-07-29 DIAGNOSIS — G35 Multiple sclerosis: Secondary | ICD-10-CM | POA: Insufficient documentation

## 2018-07-29 DIAGNOSIS — M545 Low back pain: Secondary | ICD-10-CM | POA: Diagnosis not present

## 2018-07-29 DIAGNOSIS — F419 Anxiety disorder, unspecified: Secondary | ICD-10-CM | POA: Diagnosis not present

## 2018-07-29 DIAGNOSIS — G629 Polyneuropathy, unspecified: Secondary | ICD-10-CM | POA: Insufficient documentation

## 2018-07-29 DIAGNOSIS — G4700 Insomnia, unspecified: Secondary | ICD-10-CM | POA: Diagnosis not present

## 2018-07-29 DIAGNOSIS — G894 Chronic pain syndrome: Secondary | ICD-10-CM | POA: Insufficient documentation

## 2018-07-29 DIAGNOSIS — M4726 Other spondylosis with radiculopathy, lumbar region: Secondary | ICD-10-CM | POA: Diagnosis not present

## 2018-07-29 MED ORDER — MORPHINE SULFATE ER 100 MG PO TBCR: 100.0000 mg | EXTENDED_RELEASE_TABLET | Freq: Two times a day (BID) | ORAL | 0 refills | Status: DC

## 2018-07-29 NOTE — Patient Instructions (Signed)
PLEASE FEEL FREE TO CALL OUR OFFICE WITH ANY PROBLEMS OR QUESTIONS ([phone])      

## 2018-07-29 NOTE — Progress Notes (Signed)
Subjective:    Patient ID: Kenneth Farrell, male    DOB: 18-Dec-1970, 47 y.o.   MRN: [id number]  HPI   Kenneth Farrell is here in follow up of his chronic pain. He has been doing well although he struggles with the damp and cooler weather. He works full time with a [employer] company in different capacities.   He remains on MS Contin for pain control every 12 hours. This controls his pain. He does stretches and paces himself each at work.   His weight is stable. He reports no new health issues.   Pain Inventory Average Pain 7 Pain Right Now 7 My pain is intermittent, constant, sharp, burning, dull, stabbing, tingling and aching  In the last 24 hours, has pain interfered with the following? General activity 5 Relation with others 7 Enjoyment of life 7 What TIME of day is your pain at its worst? all Sleep (in general) Fair  Pain is worse with: walking, bending, sitting, inactivity, standing, unsure and some activites Pain improves with: rest, heat/ice, therapy/exercise, pacing activities and medication Relief from Meds: 4  Mobility walk without assistance how many minutes can you walk? 15 ability to climb steps?  yes do you drive?  yes  Function employed # of hrs/week 25 disabled: date disabled 71  Neuro/Psych numbness tingling trouble walking spasms anxiety  Prior Studies Any changes since last visit?  no  Physicians involved in your care Any changes since last visit?  no   Family History  Problem Relation Age of Onset  . Hypertension Mother   . Hypertension Father    Social History   Socioeconomic History  . Marital status: Single    Spouse name: Not on file  . Number of children: Not on file  . Years of education: Not on file  . Highest education level: Not on file  Occupational History  . Not on file  Social Needs  . Financial resource strain: Not on file  . Food insecurity:    Worry: Not on file    Inability: Not on file  . Transportation needs:   Medical: Not on file    Non-medical: Not on file  Tobacco Use  . Smoking status: Former Smoker    Last attempt to quit: 06/27/2011    Years since quitting: 7.0  . Smokeless tobacco: Never Used  Substance and Sexual Activity  . Alcohol use: Not on file  . Drug use: Not on file  . Sexual activity: Not on file  Lifestyle  . Physical activity:    Days per week: Not on file    Minutes per session: Not on file  . Stress: Not on file  Relationships  . Social connections:    Talks on phone: Not on file    Gets together: Not on file    Attends religious service: Not on file    Active member of club or organization: Not on file    Attends meetings of clubs or organizations: Not on file    Relationship status: Not on file  Other Topics Concern  . Not on file  Social History Narrative  . Not on file   Past Surgical History:  Procedure Laterality Date  . HERNIA REPAIR    . SPINE SURGERY    . urinary tract surgery     Past Medical History:  Diagnosis Date  . Anxiety   . Chronic pain syndrome   . 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 Facet  syndrome, lumbar   . Kyphoscoliosis and scoliosis   . Lumbago    BP 127/89   Pulse (!) 113   Ht 5\' 8"  (1.727 m)   Wt 230 lb (104.3 kg)   SpO2 96%   BMI 34.97 kg/m   Opioid Risk Score:   Fall Risk Score:  `1  Depression screen PHQ 2/9  Depression screen [facility] 2/9 07/29/2018 01/21/2018 04/10/2016 10/24/2015 06/13/2015 05/01/2015 01/04/2015  Decreased Interest 0 0 0 0 0 0 0  Down, Depressed, Hopeless 0 0 0 0 0 0 0  PHQ - 2 Score 0 0 0 0 0 0 0  Altered sleeping - - - - - - 0  Tired, decreased energy - - - - - - 0  Change in appetite - - - - - - 1  Feeling bad or failure about yourself  - - - - - - 0  Trouble concentrating - - - - - - 0  Moving slowly or fidgety/restless - - - - - - 0  Suicidal thoughts - - - - - - 0  PHQ-9 Score - - - - - - 1    Review of Systems  Constitutional: Negative.   HENT: Negative.   Eyes: Negative.     Respiratory: Negative.   Cardiovascular: Negative.   Gastrointestinal: Negative.   Endocrine: Negative.   Genitourinary: Negative.   Musculoskeletal: Negative.   Skin: Negative.   Allergic/Immunologic: Negative.   Neurological: Negative.   Hematological: Negative.   Psychiatric/Behavioral: Negative.   All other systems reviewed and are negative.      Objective:   Physical Exam General: No acute distress HEENT: EOMI, oral membranes moist Cards: reg rate  Chest: normal effort Abdomen: Soft, NT, ND Skin: dry, intact Extremities: no edema Musculoskeletal: lumbar ROM stable. Forward flexion tender as is extension. Right hemipelvis elevated. Levoscoliosis lumbar-thoracic spine.  Right facial/trunk hypoplasia unchanged Neurological: strength 5/5  Psychiatric: pleasant    Assessment & Plan:  ASSESSMENT:  1. 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  2. Chronic associated mid to low back pain.     PLAN:  1. I refilled his MS Contin 100 mg q.12 h. Refilled today #60.We will continue the opioid monitoring program, this consists of regular clinic visits, examinations, routine drug screening, pill counts as well as use of [place] Controlled Substance Reporting System. [place] today. -continue with HEP              -good ergonomics and mechanics.  -continueflexeril 5mg  q8 prn for spasms  2. HTN: per primary 3. Return visit in about2 months' time with NP.  of face to face patient care time were spent during this visit. All questions were encouraged and answered.

## 2018-07-30 ENCOUNTER — Telehealth: Payer: Self-pay

## 2018-07-30 DIAGNOSIS — M47816 Spondylosis without myelopathy or radiculopathy, lumbar region: Secondary | ICD-10-CM

## 2018-07-30 DIAGNOSIS — Q870 Congenital malformation syndromes predominantly affecting facial appearance: Principal | ICD-10-CM

## 2018-07-30 DIAGNOSIS — G894 Chronic pain syndrome: Secondary | ICD-10-CM

## 2018-07-30 NOTE — Telephone Encounter (Signed)
Pt has new insurance and the insurance requires an prior authorization for patients MS Contin. The pharmacist filled a 7 day supply of the MS Contin to await the prior authorization so the other 23 days of the prescription was forfeited. He will need a new prescription to cover a 23 day supply. The prior authorization has been submitted.

## 2018-07-31 MED ORDER — MORPHINE SULFATE ER 100 MG PO TBCR: 100.0000 mg | EXTENDED_RELEASE_TABLET | Freq: Two times a day (BID) | ORAL | 0 refills | Status: DC

## 2018-07-31 NOTE — Telephone Encounter (Signed)
done

## 2018-08-04 ENCOUNTER — Other Ambulatory Visit: Payer: Self-pay | Admitting: Physical Medicine & Rehabilitation

## 2018-08-04 DIAGNOSIS — G894 Chronic pain syndrome: Secondary | ICD-10-CM

## 2018-08-04 DIAGNOSIS — M47816 Spondylosis without myelopathy or radiculopathy, lumbar region: Secondary | ICD-10-CM

## 2018-08-11 ENCOUNTER — Other Ambulatory Visit: Payer: Self-pay | Admitting: Registered Nurse

## 2018-09-23 ENCOUNTER — Encounter: Payer: Self-pay | Admitting: Registered Nurse

## 2018-09-23 ENCOUNTER — Encounter: Payer: Medicare Other | Attending: Registered Nurse | Admitting: Registered Nurse

## 2018-09-23 VITALS — BP 150/110 | HR 110 | Resp 14 | Ht 68.0 in | Wt 235.0 lb

## 2018-09-23 DIAGNOSIS — I10 Essential (primary) hypertension: Secondary | ICD-10-CM

## 2018-09-23 DIAGNOSIS — M4726 Other spondylosis with radiculopathy, lumbar region: Secondary | ICD-10-CM | POA: Diagnosis not present

## 2018-09-23 DIAGNOSIS — F419 Anxiety disorder, unspecified: Secondary | ICD-10-CM | POA: Insufficient documentation

## 2018-09-23 DIAGNOSIS — Q870 Congenital malformation syndromes predominantly affecting facial appearance: Secondary | ICD-10-CM | POA: Diagnosis not present

## 2018-09-23 DIAGNOSIS — M545 Low back pain: Secondary | ICD-10-CM | POA: Insufficient documentation

## 2018-09-23 DIAGNOSIS — Z79899 Other long term (current) drug therapy: Secondary | ICD-10-CM

## 2018-09-23 DIAGNOSIS — Z87891 Personal history of nicotine dependence: Secondary | ICD-10-CM | POA: Insufficient documentation

## 2018-09-23 DIAGNOSIS — M62838 Other muscle spasm: Secondary | ICD-10-CM

## 2018-09-23 DIAGNOSIS — M5416 Radiculopathy, lumbar region: Secondary | ICD-10-CM | POA: Diagnosis not present

## 2018-09-23 DIAGNOSIS — M542 Cervicalgia: Secondary | ICD-10-CM | POA: Diagnosis not present

## 2018-09-23 DIAGNOSIS — G35 Multiple sclerosis: Secondary | ICD-10-CM | POA: Diagnosis not present

## 2018-09-23 DIAGNOSIS — G629 Polyneuropathy, unspecified: Secondary | ICD-10-CM | POA: Insufficient documentation

## 2018-09-23 DIAGNOSIS — G4700 Insomnia, unspecified: Secondary | ICD-10-CM | POA: Diagnosis not present

## 2018-09-23 DIAGNOSIS — Z760 Encounter for issue of repeat prescription: Secondary | ICD-10-CM | POA: Insufficient documentation

## 2018-09-23 DIAGNOSIS — M419 Scoliosis, unspecified: Secondary | ICD-10-CM | POA: Insufficient documentation

## 2018-09-23 DIAGNOSIS — M47816 Spondylosis without myelopathy or radiculopathy, lumbar region: Secondary | ICD-10-CM | POA: Diagnosis not present

## 2018-09-23 DIAGNOSIS — F411 Generalized anxiety disorder: Secondary | ICD-10-CM

## 2018-09-23 DIAGNOSIS — G894 Chronic pain syndrome: Secondary | ICD-10-CM | POA: Diagnosis present

## 2018-09-23 DIAGNOSIS — Z5181 Encounter for therapeutic drug level monitoring: Secondary | ICD-10-CM

## 2018-09-23 MED ORDER — MORPHINE SULFATE ER 100 MG PO TBCR: 100.0000 mg | EXTENDED_RELEASE_TABLET | Freq: Two times a day (BID) | ORAL | 0 refills | Status: DC

## 2018-09-23 MED ORDER — ZOLPIDEM TARTRATE 10 MG PO TABS: ORAL_TABLET | ORAL | 3 refills | Status: DC

## 2018-09-23 NOTE — Progress Notes (Signed)
Subjective:    Patient ID: Kenneth Farrell, male    DOB: October 25, 1970, 47 y.o.   MRN: [id number]  HPI: Kenneth Farrell is a 47 y.o. male who returns for follow up appointment for chronic pain and medication refill. He states his pain is located in his neck, lower back pain radiating into his right lower extremity and,bilateral hip pain . He rates his  pain 7.His current exercise regime is walking.  Kenneth Farrell arrived hypertensive reports he is compliant with his antihypertensive medication. He refuses ED or Urgent Care evaluation. Instructed to keep blood pressure log and F/U with PCP. He verbalizes understanding.   Kenneth Farrell Morphine equivalent is 200.00MME.He is also prescribed alprazolam.We have discussed the black box warning of using opioids and benzodiazepines. I highlighted the dangers of using these drugs together and discussed the adverse events including respiratory suppression, overdose, cognitive impairment and importance of compliance with current regimen. We will continue to monitor and adjust as indicated.   Pain Inventory Average Pain 7 Pain Right Now 7 My pain is intermittent, constant, sharp, burning, dull, stabbing, tingling and aching  In the last 24 hours, has pain interfered with the following? General activity 7 Relation with others 7 Enjoyment of life 7 What TIME of day is your pain at its worst? all Sleep (in general) Fair  Pain is worse with: walking, bending, sitting, inactivity, standing and some activites Pain improves with: rest, heat/ice, therapy/exercise, pacing activities and medication Relief from Meds: 5  Mobility walk without assistance how many minutes can you walk? 10-15 ability to climb steps?  yes do you drive?  yes  Function employed # of hrs/week . disabled: date disabled .  Neuro/Psych numbness tingling trouble walking spasms anxiety  Prior Studies Any changes since last visit?  no  Physicians involved in your care Any changes  since last visit?  no   Family History  Problem Relation Age of Onset  . Hypertension Mother   . Hypertension Father    Social History   Socioeconomic History  . Marital status: Single    Spouse name: Not on file  . Number of children: Not on file  . Years of education: Not on file  . Highest education level: Not on file  Occupational History  . Not on file  Social Needs  . Financial resource strain: Not on file  . Food insecurity:    Worry: Not on file    Inability: Not on file  . Transportation needs:    Medical: Not on file    Non-medical: Not on file  Tobacco Use  . Smoking status: Former Smoker    Last attempt to quit: 06/27/2011    Years since quitting: 7.2  . Smokeless tobacco: Never Used  Substance and Sexual Activity  . Alcohol use: Not on file  . Drug use: Not on file  . Sexual activity: Not on file  Lifestyle  . Physical activity:    Days per week: Not on file    Minutes per session: Not on file  . Stress: Not on file  Relationships  . Social connections:    Talks on phone: Not on file    Gets together: Not on file    Attends religious service: Not on file    Active member of club or organization: Not on file    Attends meetings of clubs or organizations: Not on file    Relationship status: Not on file  Other Topics Concern  . Not  on file  Social History Narrative  . Not on file   Past Surgical History:  Procedure Laterality Date  . HERNIA REPAIR    . SPINE SURGERY    . urinary tract surgery     Past Medical History:  Diagnosis Date  . Anxiety   . Chronic pain syndrome   . 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 Facet syndrome, lumbar   . Kyphoscoliosis and scoliosis   . Lumbago    BP (!) 163/104   Pulse (!) 110   Resp 14   Ht 5\' 8"  (1.727 m)   Wt 235 lb (106.6 kg)   SpO2 95%   BMI 35.73 kg/m   Opioid Risk Score:   Fall Risk Score:  `1  Depression screen PHQ 2/9  Depression screen [facility] 2/9 07/29/2018 01/21/2018  04/10/2016 10/24/2015 06/13/2015 05/01/2015 01/04/2015  Decreased Interest 0 0 0 0 0 0 0  Down, Depressed, Hopeless 0 0 0 0 0 0 0  PHQ - 2 Score 0 0 0 0 0 0 0  Altered sleeping - - - - - - 0  Tired, decreased energy - - - - - - 0  Change in appetite - - - - - - 1  Feeling bad or failure about yourself  - - - - - - 0  Trouble concentrating - - - - - - 0  Moving slowly or fidgety/restless - - - - - - 0  Suicidal thoughts - - - - - - 0  PHQ-9 Score - - - - - - 1    Review of Systems  Constitutional: Negative.   HENT: Negative.   Eyes: Negative.   Respiratory: Negative.   Cardiovascular: Negative.   Gastrointestinal: Negative.   Endocrine: Negative.   Genitourinary: Negative.   Musculoskeletal: Positive for arthralgias, back pain, gait problem, myalgias, neck pain and neck stiffness.       Spasms   Skin: Negative.   Allergic/Immunologic: Negative.   Neurological: Positive for numbness.       Tingling  Hematological: Negative.   Psychiatric/Behavioral: The patient is nervous/anxious.        Objective:   Physical Exam Vitals signs and nursing note reviewed.  Constitutional:      Appearance: Normal appearance.  Neck:     Comments: Cervical Paraspinal Tenderness: C-5-C-6 Musculoskeletal:     Comments: Normal Muscle Bulk and Muscle Testing Reveals:  Upper Extremities: Full ROM and Muscle Strength 5/5 Bilateral AC Joint Tenderness  Thoracic Paraspinal Tenderness: T-7-T-9  Lumbar Paraspinal Tenderness: L-3-L-5 Lower Extremities: Full ROM and Muscle Strength 5/5 Arises from chair with ease Narrow Based  Gait   Skin:    General: Skin is warm and dry.  Neurological:     Mental Status: He is alert and oriented to person, place, and time.  Psychiatric:        Mood and Affect: Mood normal.        Behavior: Behavior normal.           Assessment & Plan:  1. 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 Continue using Pamelor for neuropathy. Also recommended alternating heat and ice for shoulder pain. 09/23/2018 Refilled:MS Contin 100mg  #60,---use one tablet every 12 hours. 09/23/2018 We will continue the opioid monitoring program, this consists of regular clinic visits, examinations, urine drug  screen, pill counts as well as use of [place] Controlled Substance Reporting System. 3. Anxiety: Continue Xanax.09/23/2018 4. Insomnia: Continue Ambien.09/23/2018 5. Muscle Spasm: Continue Flexeril. 09/23/2018 6. Uncontrolled Hypertension: Refused ED or Urgent Care Evaluation. Reports he is compliant with his antihypertensive medication.  7. Cervicalgia: Continue HEP as tolerated. Continue to monitor.   20 minutes of face to face patient care time was spent during this visit. All questions were encouraged and answered.  F/U in 1 month

## 2018-10-12 ENCOUNTER — Telehealth: Payer: Self-pay | Admitting: Registered Nurse

## 2018-10-12 MED ORDER — NORTRIPTYLINE HCL 50 MG PO CAPS: 100.0000 mg | ORAL_CAPSULE | Freq: Every day | ORAL | 2 refills | Status: DC

## 2018-10-12 NOTE — Telephone Encounter (Signed)
Received My Chart Message from Mr. Meuth, he has a new pharmacy. Pamelor ordered. My chart message sent.

## 2018-11-18 ENCOUNTER — Encounter: Payer: Medicare Other | Attending: Registered Nurse | Admitting: Registered Nurse

## 2018-11-18 ENCOUNTER — Other Ambulatory Visit: Payer: Self-pay

## 2018-11-18 ENCOUNTER — Encounter: Payer: Self-pay | Admitting: Registered Nurse

## 2018-11-18 VITALS — BP 152/98 | HR 91 | Ht 68.0 in | Wt 233.0 lb

## 2018-11-18 DIAGNOSIS — M47816 Spondylosis without myelopathy or radiculopathy, lumbar region: Secondary | ICD-10-CM

## 2018-11-18 DIAGNOSIS — M419 Scoliosis, unspecified: Secondary | ICD-10-CM | POA: Insufficient documentation

## 2018-11-18 DIAGNOSIS — G4700 Insomnia, unspecified: Secondary | ICD-10-CM | POA: Diagnosis not present

## 2018-11-18 DIAGNOSIS — M62838 Other muscle spasm: Secondary | ICD-10-CM | POA: Diagnosis not present

## 2018-11-18 DIAGNOSIS — Z79899 Other long term (current) drug therapy: Secondary | ICD-10-CM

## 2018-11-18 DIAGNOSIS — M545 Low back pain: Secondary | ICD-10-CM | POA: Diagnosis not present

## 2018-11-18 DIAGNOSIS — Z760 Encounter for issue of repeat prescription: Secondary | ICD-10-CM | POA: Insufficient documentation

## 2018-11-18 DIAGNOSIS — G35 Multiple sclerosis: Secondary | ICD-10-CM | POA: Diagnosis not present

## 2018-11-18 DIAGNOSIS — M542 Cervicalgia: Secondary | ICD-10-CM

## 2018-11-18 DIAGNOSIS — M5416 Radiculopathy, lumbar region: Secondary | ICD-10-CM

## 2018-11-18 DIAGNOSIS — M4726 Other spondylosis with radiculopathy, lumbar region: Secondary | ICD-10-CM | POA: Diagnosis not present

## 2018-11-18 DIAGNOSIS — F411 Generalized anxiety disorder: Secondary | ICD-10-CM

## 2018-11-18 DIAGNOSIS — Z87891 Personal history of nicotine dependence: Secondary | ICD-10-CM | POA: Insufficient documentation

## 2018-11-18 DIAGNOSIS — Z5181 Encounter for therapeutic drug level monitoring: Secondary | ICD-10-CM

## 2018-11-18 DIAGNOSIS — Q870 Congenital malformation syndromes predominantly affecting facial appearance: Secondary | ICD-10-CM | POA: Diagnosis not present

## 2018-11-18 DIAGNOSIS — G894 Chronic pain syndrome: Secondary | ICD-10-CM | POA: Insufficient documentation

## 2018-11-18 DIAGNOSIS — F419 Anxiety disorder, unspecified: Secondary | ICD-10-CM | POA: Insufficient documentation

## 2018-11-18 DIAGNOSIS — G629 Polyneuropathy, unspecified: Secondary | ICD-10-CM | POA: Diagnosis not present

## 2018-11-18 DIAGNOSIS — F5101 Primary insomnia: Secondary | ICD-10-CM

## 2018-11-18 DIAGNOSIS — I10 Essential (primary) hypertension: Secondary | ICD-10-CM | POA: Diagnosis not present

## 2018-11-18 MED ORDER — MORPHINE SULFATE ER 100 MG PO TBCR: 100.0000 mg | EXTENDED_RELEASE_TABLET | Freq: Two times a day (BID) | ORAL | 0 refills | Status: DC

## 2018-11-18 NOTE — Progress Notes (Signed)
Subjective:    Patient ID: Kenneth Farrell, male    DOB: August 30, 1971, 48 y.o.   MRN: [id number]  HPI: Kenneth Farrell is a 48 y.o. male who returns for follow up appointment for chronic pain and medication refill. He states his pain is located in his neck, lower back radiating into his bilateral lower extremities and right hip pain. He rates his pain 7. His current exercise regime is walking.  Mr.. Dudding Morphine equivalent is 200.00  MME. He is also prescribed Alprazolam  *We have discussed the black box warning of using opioids and benzodiazepines. I highlighted the dangers of using these drugs together and discussed the adverse events including respiratory suppression, overdose, cognitive impairment and importance of compliance with current regimen. We will continue to monitor and adjust as indicated.   Discussed with Mr. Eriksson his medication regimen, his current regimen is controlling his pain. We discussed beginning a slow weaning , he wants to discuss with Dr. Riley Kill, next appointment scheduled with Dr. Riley Kill, he verbalizes understanding.   Pain Inventory Average Pain 7 Pain Right Now 7 My pain is intermittent, constant, sharp, burning, dull, stabbing, tingling and aching  In the last 24 hours, has pain interfered with the following? General activity 7 Relation with others 7 Enjoyment of life 7 What TIME of day is your pain at its worst? all Sleep (in general) Fair  Pain is worse with: walking, bending, sitting, inactivity, standing, unsure and some activites Pain improves with: rest, heat/ice, therapy/exercise, pacing activities and medication Relief from Meds: 5  Mobility walk without assistance how many minutes can you walk? 10 ability to climb steps?  yes do you drive?  yes  Function employed # of hrs/week 25  Neuro/Psych tingling trouble walking spasms anxiety  Prior Studies Any changes since last visit?  no  Physicians involved in your care Any changes since  last visit?  no   Family History  Problem Relation Age of Onset  . Hypertension Mother   . Hypertension Father    Social History   Socioeconomic History  . Marital status: Single    Spouse name: Not on file  . Number of children: Not on file  . Years of education: Not on file  . Highest education level: Not on file  Occupational History  . Not on file  Social Needs  . Financial resource strain: Not on file  . Food insecurity:    Worry: Not on file    Inability: Not on file  . Transportation needs:    Medical: Not on file    Non-medical: Not on file  Tobacco Use  . Smoking status: Former Smoker    Last attempt to quit: 06/27/2011    Years since quitting: 7.4  . Smokeless tobacco: Never Used  Substance and Sexual Activity  . Alcohol use: Not on file  . Drug use: Not on file  . Sexual activity: Not on file  Lifestyle  . Physical activity:    Days per week: Not on file    Minutes per session: Not on file  . Stress: Not on file  Relationships  . Social connections:    Talks on phone: Not on file    Gets together: Not on file    Attends religious service: Not on file    Active member of club or organization: Not on file    Attends meetings of clubs or organizations: Not on file    Relationship status: Not on file  Other  Topics Concern  . Not on file  Social History Narrative  . Not on file   Past Surgical History:  Procedure Laterality Date  . HERNIA REPAIR    . SPINE SURGERY    . urinary tract surgery     Past Medical History:  Diagnosis Date  . Anxiety   . Chronic pain syndrome   . 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 Facet syndrome, lumbar   . Kyphoscoliosis and scoliosis   . Lumbago    BP (!) 161/106   Pulse 86   Ht 5\' 8"  (1.727 m)   Wt 233 lb (105.7 kg)   SpO2 94%   BMI 35.43 kg/m   Opioid Risk Score:   Fall Risk Score:  `1  Depression screen PHQ 2/9  Depression screen [facility] 2/9 11/18/2018 07/29/2018 01/21/2018 04/10/2016 10/24/2015  06/13/2015 05/01/2015  Decreased Interest 0 0 0 0 0 0 0  Down, Depressed, Hopeless 0 0 0 0 0 0 0  PHQ - 2 Score 0 0 0 0 0 0 0  Altered sleeping - - - - - - -  Tired, decreased energy - - - - - - -  Change in appetite - - - - - - -  Feeling bad or failure about yourself  - - - - - - -  Trouble concentrating - - - - - - -  Moving slowly or fidgety/restless - - - - - - -  Suicidal thoughts - - - - - - -  PHQ-9 Score - - - - - - -   Review of Systems  Constitutional: Negative.   HENT: Negative.   Eyes: Negative.   Respiratory: Negative.   Cardiovascular: Negative.   Gastrointestinal: Negative.   Endocrine: Negative.   Genitourinary: Negative.   Musculoskeletal: Negative.   Skin: Negative.   Allergic/Immunologic: Negative.   Neurological: Negative.   Hematological: Negative.   Psychiatric/Behavioral: Negative.   All other systems reviewed and are negative.      Objective:   Physical Exam Vitals signs and nursing note reviewed.  Constitutional:      Appearance: Normal appearance.  Neck:     Musculoskeletal: Normal range of motion and neck supple.     Comments: Cervical Paraspinal Tenderness: C-5-C-6 Cardiovascular:     Rate and Rhythm: Normal rate and regular rhythm.     Pulses: Normal pulses.     Heart sounds: Normal heart sounds.  Pulmonary:     Effort: Pulmonary effort is normal.     Breath sounds: Normal breath sounds.  Musculoskeletal:     Comments: Normal Muscle Bulk and Muscle Testing Reveals:  Upper Extremities: Full ROM and Muscle Strength 5/5 Left AC Joint Tenderness Lumbar Paraspinal Tenderness: L-3-L-5 Lower Extremities: Full ROM and Muscle Strength 5/5 Arises from chair with ease Narrow Based Gait   Skin:    General: Skin is warm and dry.  Neurological:     Mental Status: He is alert and oriented to person, place, and time.  Psychiatric:        Mood and Affect: Mood normal.        Behavior: Behavior normal.           Assessment & Plan:  1.  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 Continue using Pamelor for neuropathy. Also recommended alternating heat and  ice for shoulder pain. 11/18/2018 Refilled:MS Contin 100mg  #60,---use one tablet every 12 hours. Second script sent for the following month. 11/18/2018 We will continue the opioid monitoring program, this consists of regular clinic visits, examinations, urine drug screen, pill counts as well as use of [place] Controlled Sub[place]. 3. Anxiety: Continue Xanax.11/18/2018 4. Insomnia: Continue Ambien.11/18/2018 5. Muscle Spasm: Continue Flexeril. 11/18/2018 6. Cervicalgia: Continue HEP as tolerated. Continue to monitor.   20 minutes of face to face patient care time was spent during this visit. All questions were encouraged and answered.  F/U in 2 months

## 2018-11-30 ENCOUNTER — Other Ambulatory Visit: Payer: Self-pay | Admitting: Registered Nurse

## 2018-12-07 ENCOUNTER — Other Ambulatory Visit: Payer: Self-pay | Admitting: *Deleted

## 2018-12-07 MED ORDER — ALPRAZOLAM 1 MG PO TABS: 1.0000 mg | ORAL_TABLET | Freq: Four times a day (QID) | ORAL | 3 refills | Status: DC | PRN

## 2019-01-20 ENCOUNTER — Other Ambulatory Visit: Payer: Self-pay

## 2019-01-20 ENCOUNTER — Encounter: Payer: Medicare Other | Attending: Registered Nurse | Admitting: Physical Medicine & Rehabilitation

## 2019-01-20 ENCOUNTER — Encounter: Payer: Self-pay | Admitting: Physical Medicine & Rehabilitation

## 2019-01-20 VITALS — BP 152/93 | HR 104 | Ht 68.0 in | Wt 230.0 lb

## 2019-01-20 DIAGNOSIS — Q870 Congenital malformation syndromes predominantly affecting facial appearance: Secondary | ICD-10-CM

## 2019-01-20 DIAGNOSIS — G894 Chronic pain syndrome: Secondary | ICD-10-CM | POA: Diagnosis not present

## 2019-01-20 DIAGNOSIS — F419 Anxiety disorder, unspecified: Secondary | ICD-10-CM | POA: Insufficient documentation

## 2019-01-20 DIAGNOSIS — Z87891 Personal history of nicotine dependence: Secondary | ICD-10-CM | POA: Insufficient documentation

## 2019-01-20 DIAGNOSIS — M419 Scoliosis, unspecified: Secondary | ICD-10-CM | POA: Insufficient documentation

## 2019-01-20 DIAGNOSIS — G35 Multiple sclerosis: Secondary | ICD-10-CM | POA: Insufficient documentation

## 2019-01-20 DIAGNOSIS — M47816 Spondylosis without myelopathy or radiculopathy, lumbar region: Secondary | ICD-10-CM

## 2019-01-20 DIAGNOSIS — M62838 Other muscle spasm: Secondary | ICD-10-CM | POA: Insufficient documentation

## 2019-01-20 DIAGNOSIS — M542 Cervicalgia: Secondary | ICD-10-CM | POA: Diagnosis not present

## 2019-01-20 DIAGNOSIS — G629 Polyneuropathy, unspecified: Secondary | ICD-10-CM | POA: Insufficient documentation

## 2019-01-20 DIAGNOSIS — M4726 Other spondylosis with radiculopathy, lumbar region: Secondary | ICD-10-CM | POA: Insufficient documentation

## 2019-01-20 DIAGNOSIS — I10 Essential (primary) hypertension: Secondary | ICD-10-CM | POA: Insufficient documentation

## 2019-01-20 DIAGNOSIS — M545 Low back pain: Secondary | ICD-10-CM | POA: Insufficient documentation

## 2019-01-20 DIAGNOSIS — Z760 Encounter for issue of repeat prescription: Secondary | ICD-10-CM | POA: Insufficient documentation

## 2019-01-20 DIAGNOSIS — G4700 Insomnia, unspecified: Secondary | ICD-10-CM | POA: Insufficient documentation

## 2019-01-20 MED ORDER — MORPHINE SULFATE ER 100 MG PO TBCR: 100.0000 mg | EXTENDED_RELEASE_TABLET | Freq: Two times a day (BID) | ORAL | 0 refills | Status: DC

## 2019-01-20 NOTE — Progress Notes (Signed)
Subjective:    Patient ID: Kenneth Farrell, male    DOB: 10/09/1970, 48 y.o.   MRN: [id number]  HPI   This is a follow up tele-visit via Web[employer]. The patient is at home. MD is at office.   He has been out of work since the covid outbreak.  He is hoping to get back to work at the beginning of next month.  He has found that since he has not been moving as much that he is a bit stiffer.  He still getting out in the yard doing work in the yard and in the home.   He remains on MS Contin 100 mg twice daily.  He is using Flexeril as needed for spasms nortriptyline at bedtime.   Pain Inventory Average Pain 6 Pain Right Now 7 My pain is sharp, burning, dull, stabbing, tingling and aching  In the last 24 hours, has pain interfered with the following? General activity 7 Relation with others 7 Enjoyment of life 7 What TIME of day is your pain at its worst? all Sleep (in general) Fair  Pain is worse with: walking, bending, sitting, inactivity, standing, unsure and some activites Pain improves with: rest, heat/ice, therapy/exercise, pacing activities and medication Relief from Meds: 6  Mobility how many minutes can you walk? 10 ability to climb steps?  yes do you drive?  yes  Function disabled: date disabled na  Neuro/Psych numbness tingling trouble walking spasms anxiety  Prior Studies Any changes since last visit?  no  Physicians involved in your care Any changes since last visit?  no   Family History  Problem Relation Age of Onset  . Hypertension Mother   . Hypertension Father    Social History   Socioeconomic History  . Marital status: Single    Spouse name: Not on file  . Number of children: Not on file  . Years of education: Not on file  . Highest education level: Not on file  Occupational History  . Not on file  Social Needs  . Financial resource strain: Not on file  . Food insecurity:    Worry: Not on file    Inability: Not on file  . Transportation  needs:    Medical: Not on file    Non-medical: Not on file  Tobacco Use  . Smoking status: Former Smoker    Last attempt to quit: 06/27/2011    Years since quitting: 7.5  . Smokeless tobacco: Never Used  Substance and Sexual Activity  . Alcohol use: Not on file  . Drug use: Not on file  . Sexual activity: Not on file  Lifestyle  . Physical activity:    Days per week: Not on file    Minutes per session: Not on file  . Stress: Not on file  Relationships  . Social connections:    Talks on phone: Not on file    Gets together: Not on file    Attends religious service: Not on file    Active member of club or organization: Not on file    Attends meetings of clubs or organizations: Not on file    Relationship status: Not on file  Other Topics Concern  . Not on file  Social History Narrative  . Not on file   Past Surgical History:  Procedure Laterality Date  . HERNIA REPAIR    . SPINE SURGERY    . urinary tract surgery     Past Medical History:  Diagnosis Date  .  Anxiety   . Chronic pain syndrome   . 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 Facet syndrome, lumbar   . Kyphoscoliosis and scoliosis   . Lumbago    BP (!) 152/93 Comment: pt reported, virtual visit  Pulse (!) 104 Comment: pt reported, virtual visit  Ht 5\' 8"  (1.727 m)   Wt 230 lb (104.3 kg)   BMI 34.97 kg/m   Opioid Risk Score:   Fall Risk Score:  `1  Depression screen PHQ 2/9  Depression screen [facility] 2/9 11/18/2018 07/29/2018 01/21/2018 04/10/2016 10/24/2015 06/13/2015 05/01/2015  Decreased Interest 0 0 0 0 0 0 0  Down, Depressed, Hopeless 0 0 0 0 0 0 0  PHQ - 2 Score 0 0 0 0 0 0 0  Altered sleeping - - - - - - -  Tired, decreased energy - - - - - - -  Change in appetite - - - - - - -  Feeling bad or failure about yourself  - - - - - - -  Trouble concentrating - - - - - - -  Moving slowly or fidgety/restless - - - - - - -  Suicidal thoughts - - - - - - -  PHQ-9 Score - - - - - - -   Review of  Systems  Constitutional: Negative.   HENT: Negative.   Eyes: Negative.   Respiratory: Negative.   Cardiovascular: Negative.   Endocrine: Negative.   Genitourinary: Negative.   Musculoskeletal: Positive for back pain and neck pain.  Skin: Negative.   Allergic/Immunologic: Negative.   Neurological: Negative.   Psychiatric/Behavioral: Negative.   All other systems reviewed and are negative.   Assessment & Plan:  ASSESSMENT:  1. 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  2. Chronic associated mid to low back pain.     PLAN:  1. I refilled his MS Contin 100 mg q.12 h. Refilled today #60.  -We will continue the controlled substance monitoring program, this consists of regular clinic visits, examinations, routine drug screening, pill counts as well as use of [place] Controlled Substance Reporting System. [place].   Substance [place] System. [place] today. 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 HTN: per primary 3. Return visit in about2 months' timewith NP.  tele time spent. All questions were encouraged and answered.

## 2019-01-22 ENCOUNTER — Other Ambulatory Visit: Payer: Self-pay | Admitting: *Deleted

## 2019-01-22 DIAGNOSIS — Q870 Congenital malformation syndromes predominantly affecting facial appearance: Principal | ICD-10-CM

## 2019-01-22 MED ORDER — ZOLPIDEM TARTRATE 10 MG PO TABS: ORAL_TABLET | ORAL | 2 refills | Status: DC

## 2019-03-17 ENCOUNTER — Encounter: Payer: Medicare Other | Attending: Registered Nurse | Admitting: Registered Nurse

## 2019-03-17 ENCOUNTER — Other Ambulatory Visit: Payer: Self-pay

## 2019-03-17 VITALS — BP 146/93 | HR 97 | Ht 68.0 in | Wt 230.0 lb

## 2019-03-17 DIAGNOSIS — Z760 Encounter for issue of repeat prescription: Secondary | ICD-10-CM | POA: Insufficient documentation

## 2019-03-17 DIAGNOSIS — Z87891 Personal history of nicotine dependence: Secondary | ICD-10-CM | POA: Insufficient documentation

## 2019-03-17 DIAGNOSIS — F411 Generalized anxiety disorder: Secondary | ICD-10-CM

## 2019-03-17 DIAGNOSIS — M542 Cervicalgia: Secondary | ICD-10-CM | POA: Diagnosis not present

## 2019-03-17 DIAGNOSIS — M545 Low back pain: Secondary | ICD-10-CM | POA: Insufficient documentation

## 2019-03-17 DIAGNOSIS — Q870 Congenital malformation syndromes predominantly affecting facial appearance: Secondary | ICD-10-CM

## 2019-03-17 DIAGNOSIS — Z5181 Encounter for therapeutic drug level monitoring: Secondary | ICD-10-CM

## 2019-03-17 DIAGNOSIS — M62838 Other muscle spasm: Secondary | ICD-10-CM | POA: Insufficient documentation

## 2019-03-17 DIAGNOSIS — G894 Chronic pain syndrome: Secondary | ICD-10-CM | POA: Insufficient documentation

## 2019-03-17 DIAGNOSIS — G629 Polyneuropathy, unspecified: Secondary | ICD-10-CM | POA: Insufficient documentation

## 2019-03-17 DIAGNOSIS — G35 Multiple sclerosis: Secondary | ICD-10-CM | POA: Insufficient documentation

## 2019-03-17 DIAGNOSIS — Z79899 Other long term (current) drug therapy: Secondary | ICD-10-CM

## 2019-03-17 DIAGNOSIS — G4700 Insomnia, unspecified: Secondary | ICD-10-CM | POA: Insufficient documentation

## 2019-03-17 DIAGNOSIS — M5416 Radiculopathy, lumbar region: Secondary | ICD-10-CM | POA: Diagnosis not present

## 2019-03-17 DIAGNOSIS — M4726 Other spondylosis with radiculopathy, lumbar region: Secondary | ICD-10-CM | POA: Insufficient documentation

## 2019-03-17 DIAGNOSIS — I10 Essential (primary) hypertension: Secondary | ICD-10-CM | POA: Insufficient documentation

## 2019-03-17 DIAGNOSIS — F419 Anxiety disorder, unspecified: Secondary | ICD-10-CM | POA: Insufficient documentation

## 2019-03-17 DIAGNOSIS — M47816 Spondylosis without myelopathy or radiculopathy, lumbar region: Secondary | ICD-10-CM

## 2019-03-17 DIAGNOSIS — M419 Scoliosis, unspecified: Secondary | ICD-10-CM | POA: Insufficient documentation

## 2019-03-17 DIAGNOSIS — F5101 Primary insomnia: Secondary | ICD-10-CM

## 2019-03-17 MED ORDER — MORPHINE SULFATE ER 100 MG PO TBCR: 100.0000 mg | EXTENDED_RELEASE_TABLET | Freq: Two times a day (BID) | ORAL | 0 refills | Status: DC

## 2019-03-17 NOTE — Progress Notes (Signed)
Subjective:    Patient ID: Kenneth Farrell, male    DOB: October 15, 1970, 48 y.o.   MRN: [id number]  HPI: Kenneth Farrell is a 48 y.o. male his appointment was changed, due to national recommendations of social distancing due to COVID 19, an audio/video telehealth visit is felt to be most appropriate for this patient at this time.  See Chart message from today for the patient's consent to telehealth from [facility].     He states his pain is located in his neck, lower back pain radiating into her bilateral hips and right lower extremity. He rates his pain is 6. His current exercise regime walking.   Mr. Peche Morphine equivalent is 200.00 MME. He is also prescribed Alprazolam. We have discussed the black box warning of using opioids and benzodiazepines. I highlighted the dangers of using these drugs together and discussed the adverse events including respiratory suppression, overdose, cognitive impairment and importance of compliance with current regimen. We will continue to monitor and adjust as indicated.   Silas Sacramento CMA asked the Health and History Questions. This provider and Angelica Chessman verified we were speaking with the correct person using two identifiers.   Pain Inventory Average Pain 8 Pain Right Now 6 My pain is constant, sharp, burning, dull, stabbing, tingling and aching  In the last 24 hours, has pain interfered with the following? General activity 6 Relation with others 6 Enjoyment of life 6 What TIME of day is your pain at its worst? all Sleep (in general) Fair  Pain is worse with: walking, bending, sitting, inactivity, standing, unsure and some activites Pain improves with: rest, heat/ice, pacing activities and medication Relief from Meds: 5  Mobility how many minutes can you walk? 10 ability to climb steps?  yes do you drive?  yes  Function not employed: date last employed furloughed disabled: date disabled na  Neuro/Psych  tingling spasms  Prior Studies Any changes since last visit?  no  Physicians involved in your care Primary care Dr. Mardee Postin   Family History  Problem Relation Age of Onset  . Hypertension Mother   . Hypertension Father    Social History   Socioeconomic History  . Marital status: Single    Spouse name: Not on file  . Number of children: Not on file  . Years of education: Not on file  . Highest education level: Not on file  Occupational History  . Not on file  Social Needs  . Financial resource strain: Not on file  . Food insecurity:    Worry: Not on file    Inability: Not on file  . Transportation needs:    Medical: Not on file    Non-medical: Not on file  Tobacco Use  . Smoking status: Former Smoker    Last attempt to quit: 06/27/2011    Years since quitting: 7.7  . Smokeless tobacco: Never Used  Substance and Sexual Activity  . Alcohol use: Not on file  . Drug use: Not on file  . Sexual activity: Not on file  Lifestyle  . Physical activity:    Days per week: Not on file    Minutes per session: Not on file  . Stress: Not on file  Relationships  . Social connections:    Talks on phone: Not on file    Gets together: Not on file    Attends religious service: Not on file    Active member of club or organization: Not  on file    Attends meetings of clubs or organizations: Not on file    Relationship status: Not on file  Other Topics Concern  . Not on file  Social History Narrative  . Not on file   Past Surgical History:  Procedure Laterality Date  . HERNIA REPAIR    . SPINE SURGERY    . urinary tract surgery     Past Medical History:  Diagnosis Date  . Anxiety   . Chronic pain syndrome   . 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 Facet syndrome, lumbar   . Kyphoscoliosis and scoliosis   . Lumbago    BP (!) 146/93 Comment: pt reported virtual visit  Pulse 97 Comment: pt reported virtual visit  Ht 5\' 8"  (1.727 m) Comment: pt  reported virtual visit  Wt 230 lb (104.3 kg) Comment: pt reported virtual visit  BMI 34.97 kg/m   Opioid Risk Score:   Fall Risk Score:  `1  Depression screen PHQ 2/9  Depression screen [facility] 2/9 03/17/2019 11/18/2018 07/29/2018 01/21/2018 04/10/2016 10/24/2015 06/13/2015  Decreased Interest 0 0 0 0 0 0 0  Down, Depressed, Hopeless 0 0 0 0 0 0 0  PHQ - 2 Score 0 0 0 0 0 0 0  Altered sleeping - - - - - - -  Tired, decreased energy - - - - - - -  Change in appetite - - - - - - -  Feeling bad or failure about yourself  - - - - - - -  Trouble concentrating - - - - - - -  Moving slowly or fidgety/restless - - - - - - -  Suicidal thoughts - - - - - - -  PHQ-9 Score - - - - - - -    Review of Systems  Constitutional: Negative.   HENT: Negative.   Eyes: Negative.   Respiratory: Positive for cough, shortness of breath and wheezing.   Cardiovascular: Negative.   Gastrointestinal: Negative.   Endocrine: Negative.   Genitourinary: Negative.   Musculoskeletal: Positive for back pain and neck pain.       Hip and leg pain  Skin: Negative.   Allergic/Immunologic: Negative.   Neurological:       Tingling  Hematological: Negative.   Psychiatric/Behavioral: Negative.   All other systems reviewed and are negative.      Objective:   Physical Exam Vitals signs and nursing note reviewed.  Musculoskeletal:     Comments: No Physical Exam Performed: Virtual Visit  Neurological:     Mental Status: He is oriented to person, place, and time.           Assessment & Plan:  1. 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 Continue using Pamelor for neuropathy. Also recommended alternating heat and ice for shoulder pain. 03/17/2019 Refilled:MS Contin 100mg  #60,---use one tablet every 12 hours.Second script  sent for the following month. 03/17/2019 We will continue the opioid monitoring program, this consists of regular clinic visits, examinations, urine drug screen, pill counts as well as use of [place] Controlled Substance Reporting System. 3. Anxiety: Continue Xanax.03/17/2019 4. Insomnia: Continue Ambien.03/17/2019 5. Muscle Spasm: Continue Flexeril.03/17/2019 6. Cervicalgia: Continue HEP as tolerated. Continue to monitor.03/17/2019  F/U in 2 months  [place] Location of patient: In her Home Location of provider: Office  Established patient Time spent on call: 10 Minutes

## 2019-03-18 ENCOUNTER — Encounter: Payer: Self-pay | Admitting: Registered Nurse

## 2019-04-20 ENCOUNTER — Other Ambulatory Visit: Payer: Self-pay | Admitting: Physical Medicine & Rehabilitation

## 2019-05-11 ENCOUNTER — Other Ambulatory Visit: Payer: Self-pay | Admitting: Registered Nurse

## 2019-05-18 ENCOUNTER — Ambulatory Visit: Payer: Medicare Other | Admitting: Registered Nurse

## 2019-05-20 ENCOUNTER — Encounter: Payer: Self-pay | Admitting: Registered Nurse

## 2019-05-20 ENCOUNTER — Other Ambulatory Visit: Payer: Self-pay

## 2019-05-20 ENCOUNTER — Encounter: Payer: Medicare Other | Attending: Registered Nurse | Admitting: Registered Nurse

## 2019-05-20 VITALS — BP 151/107 | HR 100 | Temp 97.5°F | Resp 18 | Ht 68.0 in | Wt 223.6 lb

## 2019-05-20 DIAGNOSIS — G4700 Insomnia, unspecified: Secondary | ICD-10-CM | POA: Diagnosis not present

## 2019-05-20 DIAGNOSIS — Z760 Encounter for issue of repeat prescription: Secondary | ICD-10-CM | POA: Diagnosis present

## 2019-05-20 DIAGNOSIS — G35 Multiple sclerosis: Secondary | ICD-10-CM | POA: Diagnosis not present

## 2019-05-20 DIAGNOSIS — M4726 Other spondylosis with radiculopathy, lumbar region: Secondary | ICD-10-CM | POA: Insufficient documentation

## 2019-05-20 DIAGNOSIS — M542 Cervicalgia: Secondary | ICD-10-CM | POA: Diagnosis not present

## 2019-05-20 DIAGNOSIS — M419 Scoliosis, unspecified: Secondary | ICD-10-CM | POA: Insufficient documentation

## 2019-05-20 DIAGNOSIS — Z87891 Personal history of nicotine dependence: Secondary | ICD-10-CM | POA: Diagnosis not present

## 2019-05-20 DIAGNOSIS — F5101 Primary insomnia: Secondary | ICD-10-CM

## 2019-05-20 DIAGNOSIS — I10 Essential (primary) hypertension: Secondary | ICD-10-CM | POA: Diagnosis not present

## 2019-05-20 DIAGNOSIS — G894 Chronic pain syndrome: Secondary | ICD-10-CM

## 2019-05-20 DIAGNOSIS — M545 Low back pain: Secondary | ICD-10-CM | POA: Insufficient documentation

## 2019-05-20 DIAGNOSIS — M62838 Other muscle spasm: Secondary | ICD-10-CM | POA: Diagnosis not present

## 2019-05-20 DIAGNOSIS — Q870 Congenital malformation syndromes predominantly affecting facial appearance: Secondary | ICD-10-CM | POA: Diagnosis not present

## 2019-05-20 DIAGNOSIS — F419 Anxiety disorder, unspecified: Secondary | ICD-10-CM | POA: Diagnosis not present

## 2019-05-20 DIAGNOSIS — G629 Polyneuropathy, unspecified: Secondary | ICD-10-CM | POA: Diagnosis not present

## 2019-05-20 DIAGNOSIS — M47816 Spondylosis without myelopathy or radiculopathy, lumbar region: Secondary | ICD-10-CM

## 2019-05-20 DIAGNOSIS — Z79899 Other long term (current) drug therapy: Secondary | ICD-10-CM

## 2019-05-20 DIAGNOSIS — F411 Generalized anxiety disorder: Secondary | ICD-10-CM

## 2019-05-20 DIAGNOSIS — M5416 Radiculopathy, lumbar region: Secondary | ICD-10-CM

## 2019-05-20 DIAGNOSIS — Z5181 Encounter for therapeutic drug level monitoring: Secondary | ICD-10-CM

## 2019-05-20 MED ORDER — MORPHINE SULFATE ER 100 MG PO TBCR: 100.0000 mg | EXTENDED_RELEASE_TABLET | Freq: Two times a day (BID) | ORAL | 0 refills | Status: DC

## 2019-05-20 MED ORDER — ZOLPIDEM TARTRATE 10 MG PO TABS: ORAL_TABLET | ORAL | 2 refills | Status: DC

## 2019-05-20 NOTE — Progress Notes (Signed)
Subjective:    Patient ID: Kenneth Farrell, male    DOB: May 06, 1971, 48 y.o.   MRN: [id number]  HPI: Kenneth Farrell is a 48 y.o. male who returns for follow up appointment for chronic pain and medication refill. He states his pain is located in his  Neck radiating into his bilateral shoulders, lower back pain radiating into his bilateral hips R>L and right lower extremity. He rates his pain 7. His current exercise regime is walking and performing stretching exercises.  Kenneth Farrell arrived hypertensive, he reports he is compliant with his antihypertensive medication. He refuses ED evaluation. Blood pressure was rechecked, Kenneth Farrell was instructed to keep a blood pressure journal and to follow up with his PCP. He verbalizes understanding.   Kenneth Farrell Morphine equivalent is 200.00  MME. He is also prescribed Alprazolam. We have discussed the black box warning of using opioids and benzodiazepines. I highlighted the dangers of using these drugs together and discussed the adverse events including respiratory suppression, overdose, cognitive impairment and importance of compliance with current regimen. We will continue to monitor and adjust as indicated.   Last UDS was Performed on 05/27/2018, it was consistent.    Pain Inventory Average Pain 7 Pain Right Now 7 My pain is intermittent, constant, sharp, burning, dull, stabbing, tingling and aching  In the last 24 hours, has pain interfered with the following? General activity 7 Relation with others 7 Enjoyment of life 7 What TIME of day is your pain at its worst? all Sleep (in general) Fair  Pain is worse with: walking, bending, sitting, inactivity, standing and some activites Pain improves with: rest, heat/ice, therapy/exercise, pacing activities and medication Relief from Meds: 5  Mobility ability to climb steps?  yes do you drive?  yes  Function employed # of hrs/week 25 disabled: date disabled [id number]  Neuro/Psych numbness tingling  trouble walking spasms anxiety  Prior Studies Any changes since last visit?  no  Physicians involved in your care Any changes since last visit?  no   Family History  Problem Relation Age of Onset  . Hypertension Mother   . Hypertension Father    Social History   Socioeconomic History  . Marital status: Single    Spouse name: Not on file  . Number of children: Not on file  . Years of education: Not on file  . Highest education level: Not on file  Occupational History  . Not on file  Social Needs  . Financial resource strain: Not on file  . Food insecurity    Worry: Not on file    Inability: Not on file  . Transportation needs    Medical: Not on file    Non-medical: Not on file  Tobacco Use  . Smoking status: Former Smoker    Quit date: 06/27/2011    Years since quitting: 7.9  . Smokeless tobacco: Never Used  Substance and Sexual Activity  . Alcohol use: Not on file  . Drug use: Not on file  . Sexual activity: Not on file  Lifestyle  . Physical activity    Days per week: Not on file    Minutes per session: Not on file  . Stress: Not on file  Relationships  . Social Musicianconnections    Talks on phone: Not on file    Gets together: Not on file    Attends religious service: Not on file    Active member of club or organization: Not on file    Attends  meetings of clubs or organizations: Not on file    Relationship status: Not on file  Other Topics Concern  . Not on file  Social History Narrative  . Not on file   Past Surgical History:  Procedure Laterality Date  . HERNIA REPAIR    . SPINE SURGERY    . urinary tract surgery     Past Medical History:  Diagnosis Date  . Anxiety   . Chronic pain syndrome   . 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 Facet syndrome, lumbar   . Kyphoscoliosis and scoliosis   . Lumbago    There were no vitals taken for this visit.  Opioid Risk Score:   Fall Risk Score:  `1  Depression screen PHQ 2/9  Depression  screen [facility] 2/9 03/17/2019 11/18/2018 07/29/2018 01/21/2018 04/10/2016 10/24/2015 06/13/2015  Decreased Interest 0 0 0 0 0 0 0  Down, Depressed, Hopeless 0 0 0 0 0 0 0  PHQ - 2 Score 0 0 0 0 0 0 0  Altered sleeping - - - - - - -  Tired, decreased energy - - - - - - -  Change in appetite - - - - - - -  Feeling bad or failure about yourself  - - - - - - -  Trouble concentrating - - - - - - -  Moving slowly or fidgety/restless - - - - - - -  Suicidal thoughts - - - - - - -  PHQ-9 Score - - - - - - -     Review of Systems  Constitutional: Negative.   HENT: Negative.   Eyes: Negative.   Respiratory: Negative.   Cardiovascular: Negative.   Gastrointestinal: Negative.   Endocrine: Negative.   Genitourinary: Negative.   Musculoskeletal: Positive for back pain, gait problem, myalgias and neck pain.  Skin: Negative.   Allergic/Immunologic: Negative.   Neurological: Positive for numbness.  Hematological: Negative.   Psychiatric/Behavioral: The patient is nervous/anxious.   All other systems reviewed and are negative.      Objective:   Physical Exam Vitals signs and nursing note reviewed.  Constitutional:      Appearance: Normal appearance.  Neck:     Musculoskeletal: Normal range of motion and neck supple.     Comments: Cervical Paraspinal Tenderness: C-5-C-6 Cardiovascular:     Rate and Rhythm: Normal rate and regular rhythm.     Pulses: Normal pulses.     Heart sounds: Normal heart sounds.  Pulmonary:     Effort: Pulmonary effort is normal.     Breath sounds: Normal breath sounds.  Musculoskeletal:     Comments: Normal Muscle Bulk and Muscle Testing Reveals:  Upper Extremities: Full ROM and Muscle Strength 5/5 Bilateral AC Joint Tenderness  Thoracic Paraspinal Tenderness: T-7-T-9 Lumbar Hypersensitivity Lower Extremities: Full ROM an Muscle Strength 5/5 Arises from chair with ease Narrow Based Gait   Skin:    General: Skin is warm and dry.  Neurological:     Mental Status:  He is alert and oriented to person, place, and time.  Psychiatric:        Mood and Affect: Mood normal.        Behavior: Behavior normal.           Assessment & Plan:  1. 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 Continue using Pamelor for neuropathy. Also recommended alternating heat and ice for shoulder pain. 05/20/2019 Refilled:MS Contin 100mg  #60,---use one tablet every 12 hours.Second script sent for the following month. 05/20/2019 We will continue the opioid monitoring program, this consists of regular clinic visits, examinations, urine drug screen, pill counts as well as use of [place] Controlled Substance Reporting System. 3. Anxiety: Continue Xanax.05/20/2019 4. Insomnia: Continue Ambien.05/20/2019 5. Muscle Spasm: Continue Flexeril.05/20/2019 6.Cervicalgia: Continue HEP as tolerated. Continue to monitor.05/20/2019 7. Uncontrolled Hypertension: Refuses ED evaluation. Mr. Fortson states he's compliant with his medications. He was instructed to F/U with his PCP and to keep blood pressure journal. He verbalizes understanding.   15minutes of face to face patient care time was spent during this visit. All questions were encouraged and answered.  F/U in40months

## 2019-05-21 ENCOUNTER — Ambulatory Visit: Payer: Medicare Other | Admitting: Registered Nurse

## 2019-07-09 IMAGING — DX DG LUMBAR SPINE COMPLETE 4+V
5 series · 5 of 5 positions shown · non-contrast
Comparison: CT 06/11/2006 .

CLINICAL DATA: Low back pain with radiation to the right.

EXAM:
LUMBAR SPINE - COMPLETE 4+ VIEW

[l-spine ap]
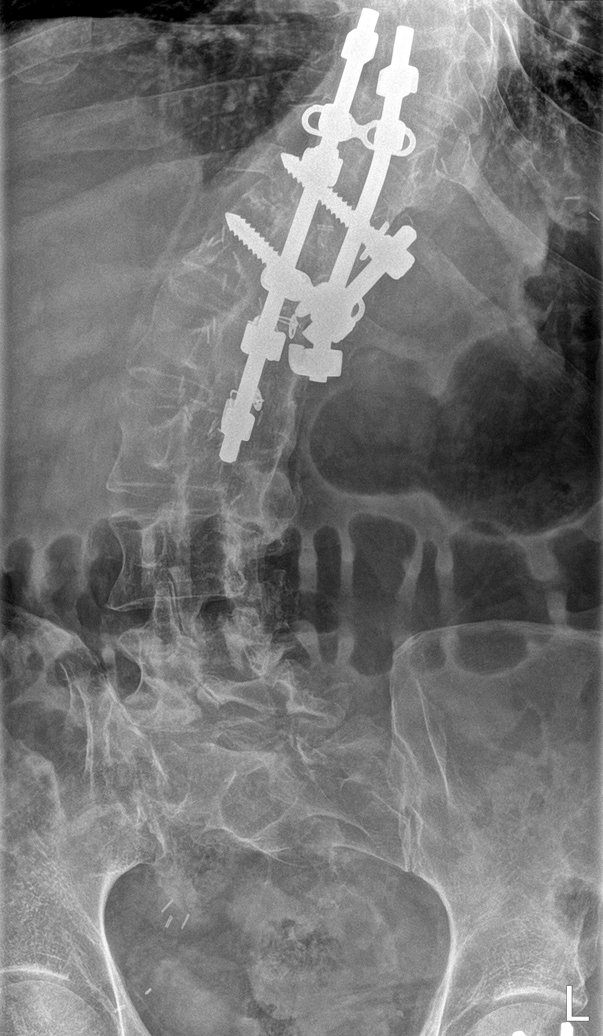

[l-spine obl (1 of 2)]
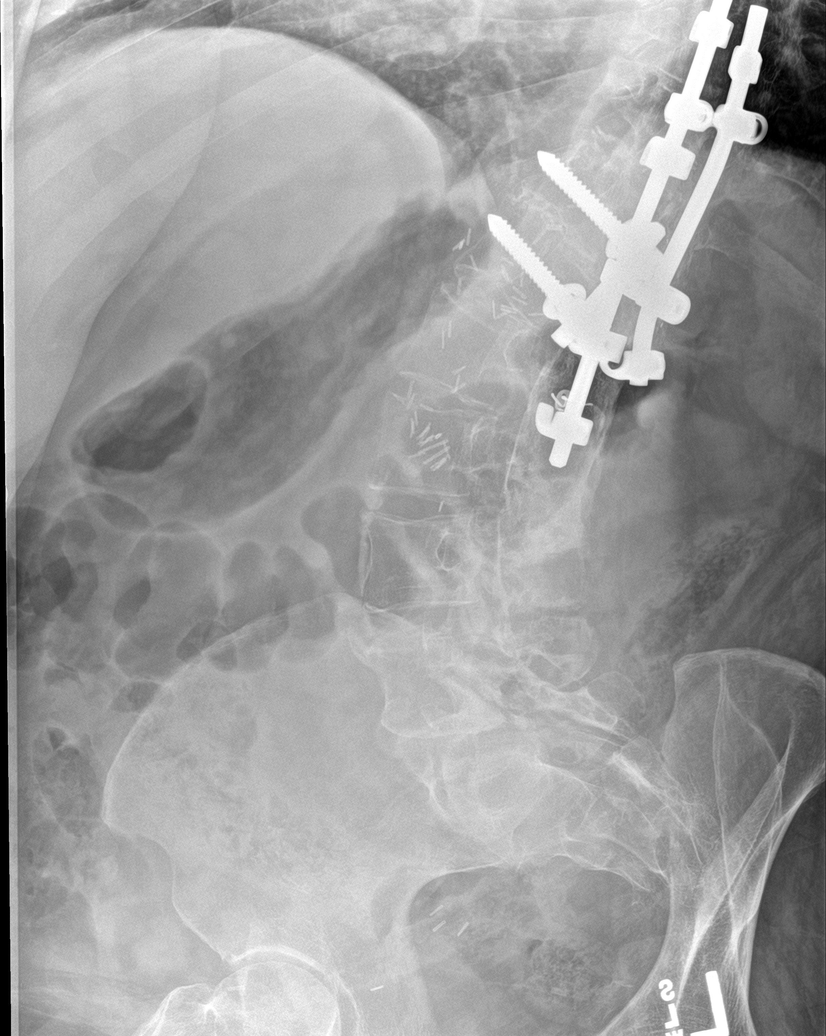

[l-spine obl (2 of 2)]
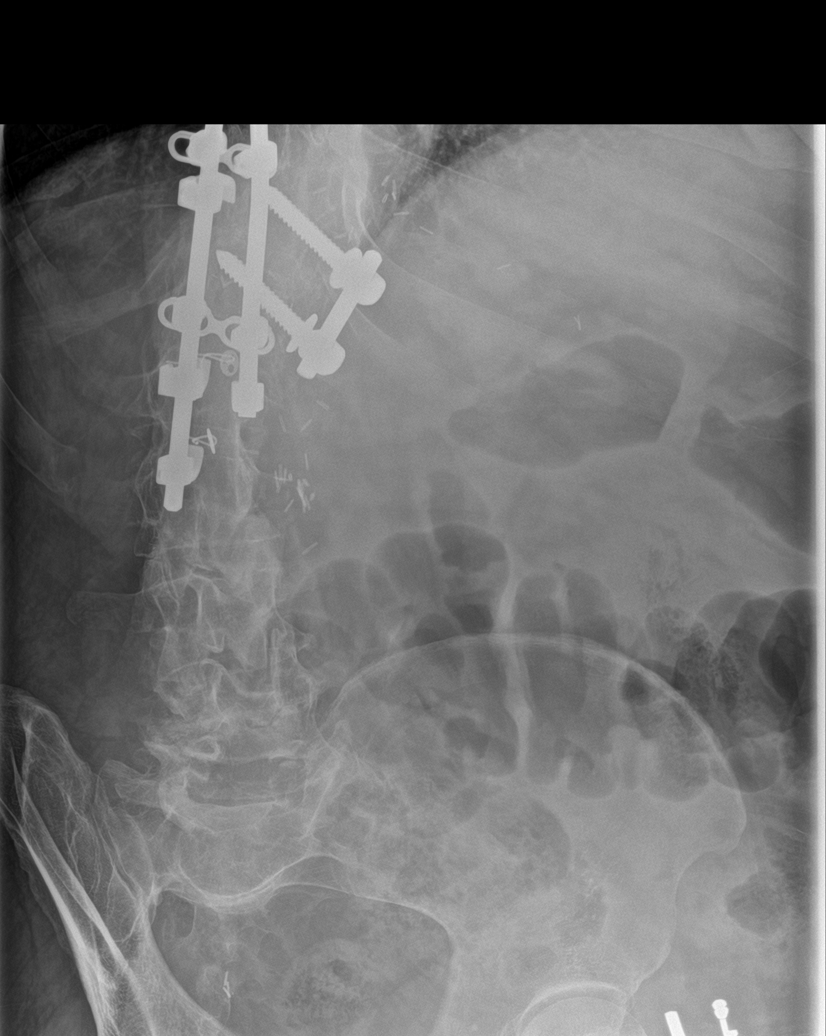

[l-spine lat]
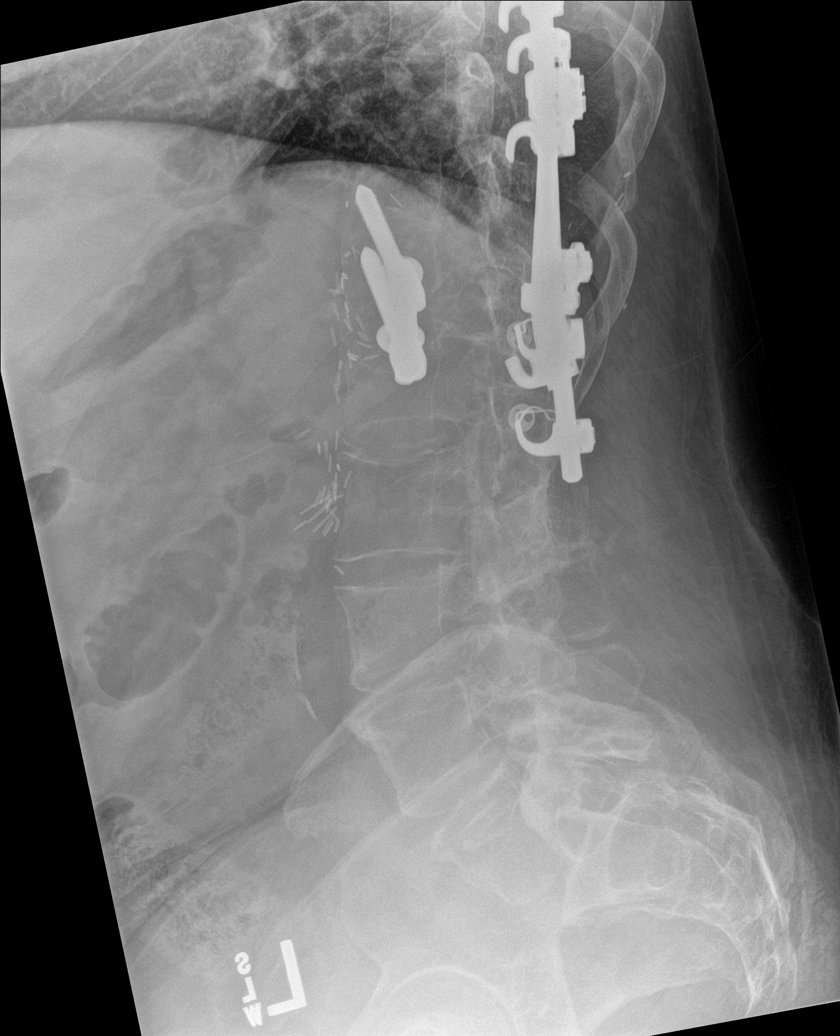

[l-spine spot]
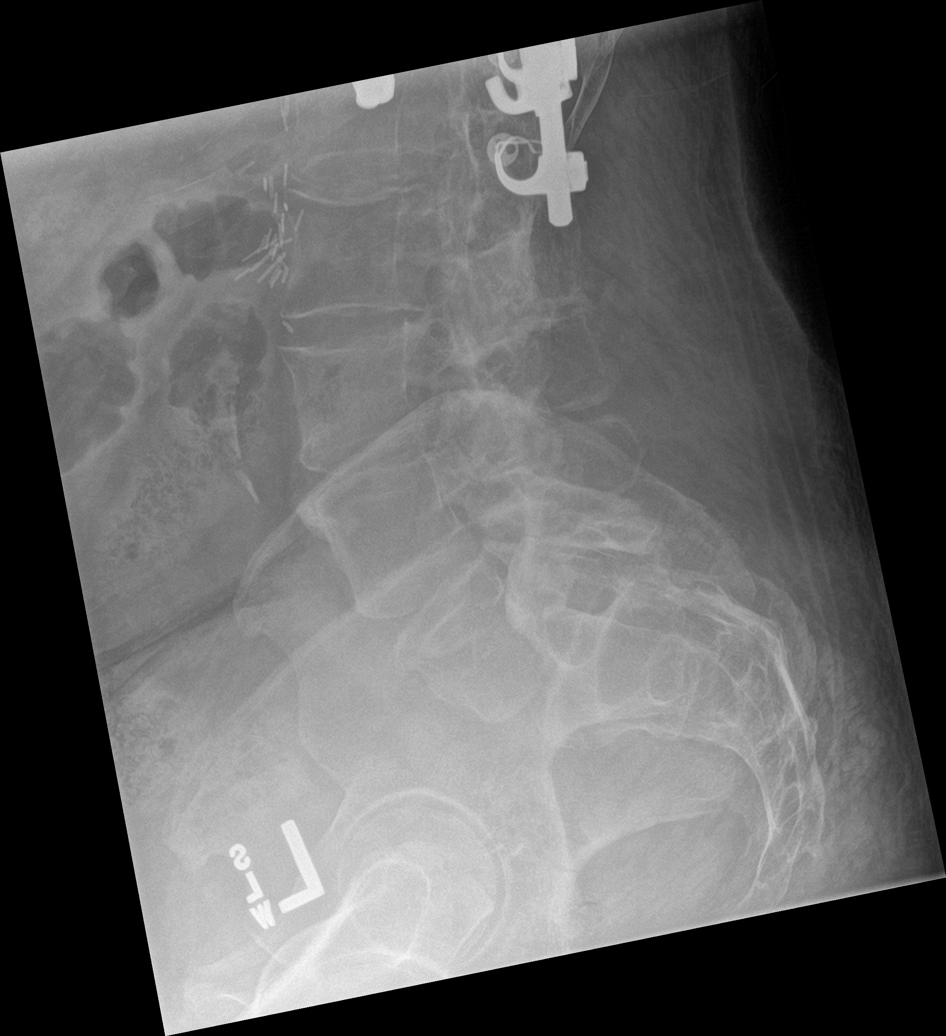

[5 of 5 positions shown; findings below may reference images not displayed]

FINDINGS: Prior thoracolumbar spine fusion. Prominent lumbar scoliosis concave
left noted. Diffuse osteopenia degenerative change. No acute bony
abnormality identified. Pelvic calcifications consistent
phleboliths. Aortoiliac atherosclerotic vascular calcification.
Surgical clips in the abdomen and pelvis.
IMPRESSION: 1. Postsurgical changes thoracolumbar spine with prior fusion.
Hardware appears to be intact. Severe lumbar spine scoliosis concave
left noted. No definite acute abnormality. No prominent compression
fracture.

2. Aortoiliac atherosclerotic vascular disease.

## 2019-07-15 ENCOUNTER — Encounter: Payer: Medicare Other | Admitting: Registered Nurse

## 2019-07-22 ENCOUNTER — Encounter: Payer: Medicare Other | Attending: Registered Nurse | Admitting: Registered Nurse

## 2019-07-22 ENCOUNTER — Other Ambulatory Visit: Payer: Self-pay

## 2019-07-22 ENCOUNTER — Encounter: Payer: Self-pay | Admitting: Registered Nurse

## 2019-07-22 VITALS — BP 152/101 | HR 97 | Temp 97.7°F | Ht 68.0 in | Wt 228.0 lb

## 2019-07-22 DIAGNOSIS — M62838 Other muscle spasm: Secondary | ICD-10-CM | POA: Diagnosis not present

## 2019-07-22 DIAGNOSIS — M7061 Trochanteric bursitis, right hip: Secondary | ICD-10-CM

## 2019-07-22 DIAGNOSIS — M419 Scoliosis, unspecified: Secondary | ICD-10-CM | POA: Diagnosis not present

## 2019-07-22 DIAGNOSIS — M7581 Other shoulder lesions, right shoulder: Secondary | ICD-10-CM

## 2019-07-22 DIAGNOSIS — M47816 Spondylosis without myelopathy or radiculopathy, lumbar region: Secondary | ICD-10-CM

## 2019-07-22 DIAGNOSIS — G35 Multiple sclerosis: Secondary | ICD-10-CM | POA: Diagnosis not present

## 2019-07-22 DIAGNOSIS — G894 Chronic pain syndrome: Secondary | ICD-10-CM

## 2019-07-22 DIAGNOSIS — Z760 Encounter for issue of repeat prescription: Secondary | ICD-10-CM | POA: Diagnosis present

## 2019-07-22 DIAGNOSIS — M546 Pain in thoracic spine: Secondary | ICD-10-CM

## 2019-07-22 DIAGNOSIS — M4726 Other spondylosis with radiculopathy, lumbar region: Secondary | ICD-10-CM | POA: Diagnosis not present

## 2019-07-22 DIAGNOSIS — Z5181 Encounter for therapeutic drug level monitoring: Secondary | ICD-10-CM

## 2019-07-22 DIAGNOSIS — I10 Essential (primary) hypertension: Secondary | ICD-10-CM

## 2019-07-22 DIAGNOSIS — Q8789 Other specified congenital malformation syndromes, not elsewhere classified: Secondary | ICD-10-CM | POA: Diagnosis not present

## 2019-07-22 DIAGNOSIS — G4700 Insomnia, unspecified: Secondary | ICD-10-CM | POA: Insufficient documentation

## 2019-07-22 DIAGNOSIS — Z79891 Long term (current) use of opiate analgesic: Secondary | ICD-10-CM

## 2019-07-22 DIAGNOSIS — G629 Polyneuropathy, unspecified: Secondary | ICD-10-CM | POA: Diagnosis not present

## 2019-07-22 DIAGNOSIS — F419 Anxiety disorder, unspecified: Secondary | ICD-10-CM | POA: Insufficient documentation

## 2019-07-22 DIAGNOSIS — F5101 Primary insomnia: Secondary | ICD-10-CM

## 2019-07-22 DIAGNOSIS — Z87891 Personal history of nicotine dependence: Secondary | ICD-10-CM | POA: Diagnosis not present

## 2019-07-22 DIAGNOSIS — M778 Other enthesopathies, not elsewhere classified: Secondary | ICD-10-CM

## 2019-07-22 DIAGNOSIS — M545 Low back pain: Secondary | ICD-10-CM | POA: Insufficient documentation

## 2019-07-22 DIAGNOSIS — Q870 Congenital malformation syndromes predominantly affecting facial appearance: Secondary | ICD-10-CM

## 2019-07-22 DIAGNOSIS — M542 Cervicalgia: Secondary | ICD-10-CM

## 2019-07-22 DIAGNOSIS — M7582 Other shoulder lesions, left shoulder: Secondary | ICD-10-CM

## 2019-07-22 DIAGNOSIS — G8929 Other chronic pain: Secondary | ICD-10-CM

## 2019-07-22 MED ORDER — MORPHINE SULFATE ER 100 MG PO TBCR: 100.0000 mg | EXTENDED_RELEASE_TABLET | Freq: Two times a day (BID) | ORAL | 0 refills | Status: DC

## 2019-07-22 MED ORDER — ALPRAZOLAM 1 MG PO TABS: ORAL_TABLET | ORAL | 3 refills | Status: DC

## 2019-07-22 MED ORDER — ZOLPIDEM TARTRATE 10 MG PO TABS: ORAL_TABLET | ORAL | 2 refills | Status: DC

## 2019-07-22 NOTE — Progress Notes (Signed)
Subjective:    Patient ID: Kenneth Farrell, male    DOB: 28-Aug-1971, 48 y.o.   MRN: [id number]  HPI: Kenneth Farrell is a 48 y.o. male who returns for follow up appointment for chronic pain and medication refill. He states his pain is located in his neck radiating into his right shoulder,also reports left shoulder pain, mid- lower back pain radiating into his right hip and right lower extremity. He rates his pain 6. His current exercise regime is walking and performing stretching exercises.  Kenneth Farrell arrived hypertensive blood pressure re-checked, he states he's being treated for a URI and using his albuterol inhaler. He's keeping a blood pressure log and his PCP made changes to his antihypertensive medication. He refuses ED or Urgent care evaluation. He was instructed to continue with blood pressure log and to call his PCP he verbalizes understanding.   Kenneth Farrell Morphine equivalent is 200.00 MME. He's also prescribed Alprazolam . We have discussed the black box warning of using opioids and benzodiazepines. I highlighted the dangers of using these drugs together and discussed the adverse events including respiratory suppression, overdose, cognitive impairment and importance of compliance with current regimen. We will continue to monitor and adjust as indicated.   UDS was Ordered today.   Pain Inventory Average Pain 5 Pain Right Now 5 My pain is sharp, burning, dull, stabbing, tingling and aching  In the last 24 hours, has pain interfered with the following? General activity 5 Relation with others 5 Enjoyment of life 5 What TIME of day is your pain at its worst? all Sleep (in general) Fair  Pain is worse with: walking, bending, sitting, inactivity, standing and some activites Pain improves with: rest, heat/ice, therapy/exercise, pacing activities, medication and injections Relief from Meds: 5  Mobility walk without assistance how many minutes can you walk? 10 ability to climb steps?   yes do you drive?  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  no  Physicians involved in your care Any changes since last visit?  no   Family History  Problem Relation Age of Onset  . Hypertension Mother   . Hypertension Father    Social History   Socioeconomic History  . Marital status: Single    Spouse name: Not on file  . Number of children: Not on file  . Years of education: Not on file  . Highest education level: Not on file  Occupational History  . Not on file  Social Needs  . Financial resource strain: Not on file  . Food insecurity    Worry: Not on file    Inability: Not on file  . Transportation needs    Medical: Not on file    Non-medical: Not on file  Tobacco Use  . Smoking status: Former Smoker    Quit date: 06/27/2011    Years since quitting: 8.0  . Smokeless tobacco: Never Used  Substance and Sexual Activity  . Alcohol use: Not on file  . Drug use: Not on file  . Sexual activity: Not on file  Lifestyle  . Physical activity    Days per week: Not on file    Minutes per session: Not on file  . Stress: Not on file  Relationships  . Social Musicianconnections    Talks on phone: Not on file    Gets together: Not on file    Attends  religious service: Not on file    Active member of club or organization: Not on file    Attends meetings of clubs or organizations: Not on file    Relationship status: Not on file  Other Topics Concern  . Not on file  Social History Narrative  . Not on file   Past Surgical History:  Procedure Laterality Date  . HERNIA REPAIR    . SPINE SURGERY    . urinary tract surgery     Past Medical History:  Diagnosis Date  . Anxiety   . Chronic pain syndrome   . 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 Facet syndrome, lumbar   . Kyphoscoliosis and scoliosis   . Lumbago    BP (!) 157/100    Pulse 100   Temp 97.7 F (36.5 C)   Ht 5\' 8"  (1.727 m)   Wt 228 lb (103.4 kg)   SpO2 94%   BMI 34.67 kg/m   Opioid Risk Score:   Fall Risk Score:  `1  Depression screen PHQ 2/9  Depression screen [facility] 2/9 03/17/2019 11/18/2018 07/29/2018 01/21/2018 04/10/2016 10/24/2015 06/13/2015  Decreased Interest 0 0 0 0 0 0 0  Down, Depressed, Hopeless 0 0 0 0 0 0 0  PHQ - 2 Score 0 0 0 0 0 0 0  Altered sleeping - - - - - - -  Tired, decreased energy - - - - - - -  Change in appetite - - - - - - -  Feeling bad or failure about yourself  - - - - - - -  Trouble concentrating - - - - - - -  Moving slowly or fidgety/restless - - - - - - -  Suicidal thoughts - - - - - - -  PHQ-9 Score - - - - - - -   Review of Systems  Neurological: Positive for numbness.  Psychiatric/Behavioral: The patient is nervous/anxious.   All other systems reviewed and are negative.      Objective:   Physical Exam Vitals signs and nursing note reviewed.  Constitutional:      Appearance: Normal appearance.  Neck:     Musculoskeletal: Normal range of motion and neck supple.     Comments: Cervical Paraspinal Tenderness: C-5-C-6 Cardiovascular:     Rate and Rhythm: Normal rate and regular rhythm.     Pulses: Normal pulses.     Heart sounds: Normal heart sounds.  Pulmonary:     Effort: Pulmonary effort is normal.     Breath sounds: Normal breath sounds.  Musculoskeletal:     Comments: Normal Muscle Bulk and Muscle Testing Reveals:  Upper Extremities: Full ROM and Muscle Strength 5/5 Bilateral AC Joint Tenderness  Thoracic Paraspinal Tenderness: T-7-T-9 Lumbar Hypersensitivity Right Greater Trochanter Tenderness  Lower Extremities: Full ROM and Muscle Strength 5/5 Arises from Table with ease Narrow Based  Gait   Skin:    General: Skin is warm and dry.  Neurological:     Mental Status: He is alert and oriented to person, place, and time.  Psychiatric:        Mood and Affect: Mood normal.        Behavior:  Behavior normal.           Assessment & Plan:  1. 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 Continue using Pamelor for neuropathy. Also recommended alternating heat and ice for shoulder pain. 07/22/2019 Refilled:MS Contin 100mg  #60,---use one tablet every 12 hours.Second script sent for the following month. 07/22/2019 We will continue the opioid monitoring program, this consists of regular clinic visits, examinations, urine drug screen, pill counts as well as use of 07/24/2019 Controlled Substance Reporting System. 3. Anxiety: Continue Xanax.07/22/2019 4. Insomnia: Continue Ambien.07/22/2019 5. Muscle Spasm: Continue Flexeril.07/22/2019 6.Cervicalgia: Continue HEP as tolerated. Continue to monitor.07/22/2019 7. Uncontrolled Hypertension: He Refuses ED or Urgent Care evaluation. Kenneth Farrell states he's compliant with his medications. He was instructed to F/U with his PCP and to continue keep blood pressure journal. He verbalizes understanding.   Carola Frost of face to face patient care time was spent during this visit. All questions were encouraged and answered.  F/U in72months

## 2019-07-23 NOTE — Telephone Encounter (Signed)
Message from patient

## 2019-07-26 LAB — TOXASSURE SELECT,+ANTIDEPR,UR

## 2019-08-02 ENCOUNTER — Telehealth: Payer: Self-pay | Admitting: *Deleted

## 2019-08-02 NOTE — Telephone Encounter (Signed)
Urine drug screen for this encounter is consistent for prescribed medication 

## 2019-09-07 ENCOUNTER — Other Ambulatory Visit: Payer: Self-pay | Admitting: Registered Nurse

## 2019-09-07 DIAGNOSIS — Q870 Congenital malformation syndromes predominantly affecting facial appearance: Secondary | ICD-10-CM

## 2019-09-20 ENCOUNTER — Ambulatory Visit: Payer: Medicare Other | Admitting: Registered Nurse

## 2019-09-24 ENCOUNTER — Encounter: Payer: Self-pay | Admitting: Registered Nurse

## 2019-09-24 ENCOUNTER — Other Ambulatory Visit: Payer: Self-pay

## 2019-09-24 ENCOUNTER — Encounter: Payer: Medicare Other | Attending: Registered Nurse | Admitting: Registered Nurse

## 2019-09-24 VITALS — BP 155/95 | HR 98 | Temp 97.9°F | Ht 68.0 in | Wt 240.0 lb

## 2019-09-24 DIAGNOSIS — Z760 Encounter for issue of repeat prescription: Secondary | ICD-10-CM | POA: Diagnosis present

## 2019-09-24 DIAGNOSIS — M545 Low back pain: Secondary | ICD-10-CM | POA: Diagnosis not present

## 2019-09-24 DIAGNOSIS — Q870 Congenital malformation syndromes predominantly affecting facial appearance: Secondary | ICD-10-CM

## 2019-09-24 DIAGNOSIS — G4700 Insomnia, unspecified: Secondary | ICD-10-CM | POA: Diagnosis not present

## 2019-09-24 DIAGNOSIS — Z87891 Personal history of nicotine dependence: Secondary | ICD-10-CM | POA: Diagnosis not present

## 2019-09-24 DIAGNOSIS — F419 Anxiety disorder, unspecified: Secondary | ICD-10-CM | POA: Diagnosis not present

## 2019-09-24 DIAGNOSIS — Z5181 Encounter for therapeutic drug level monitoring: Secondary | ICD-10-CM | POA: Diagnosis not present

## 2019-09-24 DIAGNOSIS — G35 Multiple sclerosis: Secondary | ICD-10-CM | POA: Insufficient documentation

## 2019-09-24 DIAGNOSIS — I10 Essential (primary) hypertension: Secondary | ICD-10-CM | POA: Diagnosis not present

## 2019-09-24 DIAGNOSIS — Q8789 Other specified congenital malformation syndromes, not elsewhere classified: Secondary | ICD-10-CM | POA: Insufficient documentation

## 2019-09-24 DIAGNOSIS — G629 Polyneuropathy, unspecified: Secondary | ICD-10-CM | POA: Insufficient documentation

## 2019-09-24 DIAGNOSIS — Z79891 Long term (current) use of opiate analgesic: Secondary | ICD-10-CM | POA: Insufficient documentation

## 2019-09-24 DIAGNOSIS — M62838 Other muscle spasm: Secondary | ICD-10-CM | POA: Insufficient documentation

## 2019-09-24 DIAGNOSIS — M4726 Other spondylosis with radiculopathy, lumbar region: Secondary | ICD-10-CM | POA: Diagnosis not present

## 2019-09-24 DIAGNOSIS — M47816 Spondylosis without myelopathy or radiculopathy, lumbar region: Secondary | ICD-10-CM | POA: Diagnosis present

## 2019-09-24 DIAGNOSIS — G894 Chronic pain syndrome: Secondary | ICD-10-CM | POA: Insufficient documentation

## 2019-09-24 DIAGNOSIS — F5101 Primary insomnia: Secondary | ICD-10-CM

## 2019-09-24 DIAGNOSIS — M419 Scoliosis, unspecified: Secondary | ICD-10-CM | POA: Diagnosis not present

## 2019-09-24 MED ORDER — MORPHINE SULFATE ER 100 MG PO TBCR: 100.0000 mg | EXTENDED_RELEASE_TABLET | Freq: Two times a day (BID) | ORAL | 0 refills | Status: DC

## 2019-09-24 NOTE — Progress Notes (Signed)
Subjective:    Patient ID: Kenneth Farrell, male    DOB: 1971-05-09, 48 y.o.   MRN: [id number]  HPI: Kenneth Farrell is a 48 y.o. male who returns for follow up appointment for chronic pain and medication refill. He states his pain is located in his neck radiating into his bilateral shoulders and lower back pain radiating into his bilateral hips. He rates his pain 5. His current exercise regime is walking and performing stretching exercises.  Mr. Say arrived hypertensive , blood pressure was re-checked.   Mr. Ciullo Morphine equivalent is 200.00  MME. He is also prescribed Alprazolam. We have discussed the black box warning of using opioids and benzodiazepines. I highlighted the dangers of using these drugs together and discussed the adverse events including respiratory suppression, overdose, cognitive impairment and importance of compliance with current regimen. We will continue to monitor and adjust as indicated.   UDS ordered today.    Pain Inventory Average Pain 5 Pain Right Now 5 My pain is intermittent, constant, sharp, burning, dull, stabbing, tingling and aching  In the last 24 hours, has pain interfered with the following? General activity 5 Relation with others 6 Enjoyment of life 6 What TIME of day is your pain at its worst? all Sleep (in general) Fair  Pain is worse with: walking, bending, sitting, inactivity, standing, unsure and some activites Pain improves with: rest, heat/ice, therapy/exercise, pacing activities, medication and TENS Relief from Meds: 4  Mobility walk without assistance ability to climb steps?  yes do you drive?  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  no  Physicians involved in your care Any changes since last visit?  no   Family History  Problem Relation Age of Onset  . Hypertension  Mother   . Hypertension Father    Social History   Socioeconomic History  . Marital status: Single    Spouse name: Not on file  . Number of children: Not on file  . Years of education: Not on file  . Highest education level: Not on file  Occupational History  . Not on file  Tobacco Use  . Smoking status: Former Smoker    Quit date: 06/27/2011    Years since quitting: 8.2  . Smokeless tobacco: Never Used  Substance and Sexual Activity  . Alcohol use: Not on file  . Drug use: Not on file  . Sexual activity: Not on file  Other Topics Concern  . Not on file  Social History Narrative  . Not on file   Social Determinants of Health   Financial Resource Strain:   . Difficulty of Paying Living Expenses: Not on file  Food Insecurity:   . Worried About Charity fundraiser in the Last Year: Not on file  . Ran Out of Food in the Last Year: Not on file  Transportation Needs:   . Lack of Transportation (Medical): Not on file  . Lack of Transportation (Non-Medical): Not on file  Physical Activity:   . Days of Exercise per Week: Not on file  . Minutes of Exercise per Session: Not on file  Stress:   . Feeling of Stress : Not on file  Social Connections:   . Frequency of Communication with Friends and Family: Not on file  . Frequency of Social Gatherings with Friends and Family: Not on file  .  Attends Religious Services: Not on file  . Active Member of Clubs or Organizations: Not on file  . Attends Banker Meetings: Not on file  . Marital Status: Not on file   Past Surgical History:  Procedure Laterality Date  . HERNIA REPAIR    . SPINE SURGERY    . urinary tract surgery     Past Medical History:  Diagnosis Date  . Anxiety   . Chronic pain syndrome   . 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 Facet syndrome, lumbar   . Kyphoscoliosis and scoliosis   . Lumbago    BP (!) 162/111   Pulse (!) 106   Temp 97.9 F (36.6 C)   Ht 5\' 8"  (1.727 m)   Wt 240  lb (108.9 kg)   SpO2 97%   BMI 36.49 kg/m   Opioid Risk Score:   Fall Risk Score:  `1  Depression screen PHQ 2/9  Depression screen [facility] 2/9 03/17/2019 11/18/2018 07/29/2018 01/21/2018 04/10/2016 10/24/2015 06/13/2015  Decreased Interest 0 0 0 0 0 0 0  Down, Depressed, Hopeless 0 0 0 0 0 0 0  PHQ - 2 Score 0 0 0 0 0 0 0  Altered sleeping - - - - - - -  Tired, decreased energy - - - - - - -  Change in appetite - - - - - - -  Feeling bad or failure about yourself  - - - - - - -  Trouble concentrating - - - - - - -  Moving slowly or fidgety/restless - - - - - - -  Suicidal thoughts - - - - - - -  PHQ-9 Score - - - - - - -    Review of Systems  Constitutional: Negative.   HENT: Negative.   Eyes: Negative.   Respiratory: Negative.   Gastrointestinal: Negative.   Endocrine: Negative.   Genitourinary: Negative.   Musculoskeletal: Positive for arthralgias, back pain, neck pain and neck stiffness.       Spasms   Skin: Negative.   Allergic/Immunologic: Negative.   Neurological: Positive for numbness.       Tingling   Psychiatric/Behavioral: The patient is nervous/anxious.   All other systems reviewed and are negative.      Objective:   Physical Exam Vitals and nursing note reviewed.  Constitutional:      Appearance: Normal appearance.  Neck:     Comments: Cervical Paraspinal Tenderness: C-5-C-6 Cardiovascular:     Rate and Rhythm: Normal rate and regular rhythm.     Pulses: Normal pulses.     Heart sounds: Normal heart sounds.  Pulmonary:     Effort: Pulmonary effort is normal.     Breath sounds: Normal breath sounds.  Musculoskeletal:     Comments: Normal Muscle Bulk and Muscle Testing Reveals:  Upper Extremities: Full ROM and Muscle Strength 5/5 Bilateral AC Joint Tenderness Lumbar Paraspinal Tenderness: L-3-L-5 Lower Extremities: Full ROM and Muscle Strength 5/5 Arises from Table with Ease Narrow Based  Gait   Skin:    General: Skin is warm and dry.  Neurological:      Mental Status: He is alert and oriented to person, place, and time.  Psychiatric:        Mood and Affect: Mood normal.        Behavior: Behavior normal.           Assessment & Plan:  1. 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 Continue using Pamelor for neuropathy. Also recommended alternating heat and ice for shoulder pain. 12/182020 Refilled:MS Contin 100mg  #60,---use one tablet every 12 hours.Second script sent for the following month. 09/24/2019 We will continue the opioid monitoring program, this consists of regular clinic visits, examinations, urine drug screen, pill counts as well as use of [place] Controlled Substance Reporting System. 3. Anxiety: Continue Xanax.09/24/2019 4. Insomnia: Continue Ambien.09/24/2019 5. Muscle Spasm: Continue Flexeril.Continue to monitor. 09/24/2019 6.Cervicalgia: Continue HEP as tolerated. Continue to monitor.09/24/2019  of face to face patient care time was spent during this visit. All questions were encouraged and answered.  F/U in36months

## 2019-09-28 ENCOUNTER — Telehealth: Payer: Self-pay | Admitting: *Deleted

## 2019-09-28 LAB — TOXASSURE SELECT,+ANTIDEPR,UR

## 2019-09-28 NOTE — Telephone Encounter (Signed)
Urine drug screen for this encounter is consistent for prescribed medication 

## 2019-10-19 ENCOUNTER — Other Ambulatory Visit: Payer: Self-pay | Admitting: Registered Nurse

## 2019-11-19 ENCOUNTER — Encounter: Payer: Medicare Other | Admitting: Registered Nurse

## 2019-11-26 ENCOUNTER — Encounter: Payer: Medicare Other | Admitting: Registered Nurse

## 2019-11-29 ENCOUNTER — Ambulatory Visit: Payer: Medicare Other | Admitting: Registered Nurse

## 2019-12-02 ENCOUNTER — Other Ambulatory Visit: Payer: Self-pay

## 2019-12-02 ENCOUNTER — Encounter: Payer: Medicare Other | Attending: Registered Nurse | Admitting: Registered Nurse

## 2019-12-02 ENCOUNTER — Encounter: Payer: Self-pay | Admitting: Registered Nurse

## 2019-12-02 VITALS — BP 158/106 | HR 99 | Temp 97.7°F | Ht 68.0 in | Wt 252.0 lb

## 2019-12-02 DIAGNOSIS — Z760 Encounter for issue of repeat prescription: Secondary | ICD-10-CM | POA: Insufficient documentation

## 2019-12-02 DIAGNOSIS — G35 Multiple sclerosis: Secondary | ICD-10-CM | POA: Insufficient documentation

## 2019-12-02 DIAGNOSIS — I10 Essential (primary) hypertension: Secondary | ICD-10-CM

## 2019-12-02 DIAGNOSIS — M545 Low back pain, unspecified: Secondary | ICD-10-CM

## 2019-12-02 DIAGNOSIS — M4726 Other spondylosis with radiculopathy, lumbar region: Secondary | ICD-10-CM | POA: Insufficient documentation

## 2019-12-02 DIAGNOSIS — Q870 Congenital malformation syndromes predominantly affecting facial appearance: Secondary | ICD-10-CM

## 2019-12-02 DIAGNOSIS — Q8789 Other specified congenital malformation syndromes, not elsewhere classified: Secondary | ICD-10-CM | POA: Diagnosis present

## 2019-12-02 DIAGNOSIS — G8929 Other chronic pain: Secondary | ICD-10-CM

## 2019-12-02 DIAGNOSIS — F5101 Primary insomnia: Secondary | ICD-10-CM

## 2019-12-02 DIAGNOSIS — F419 Anxiety disorder, unspecified: Secondary | ICD-10-CM | POA: Diagnosis not present

## 2019-12-02 DIAGNOSIS — Z87891 Personal history of nicotine dependence: Secondary | ICD-10-CM | POA: Insufficient documentation

## 2019-12-02 DIAGNOSIS — Z5181 Encounter for therapeutic drug level monitoring: Secondary | ICD-10-CM

## 2019-12-02 DIAGNOSIS — M62838 Other muscle spasm: Secondary | ICD-10-CM

## 2019-12-02 DIAGNOSIS — G629 Polyneuropathy, unspecified: Secondary | ICD-10-CM | POA: Insufficient documentation

## 2019-12-02 DIAGNOSIS — Z79891 Long term (current) use of opiate analgesic: Secondary | ICD-10-CM | POA: Diagnosis present

## 2019-12-02 DIAGNOSIS — M47816 Spondylosis without myelopathy or radiculopathy, lumbar region: Secondary | ICD-10-CM

## 2019-12-02 DIAGNOSIS — G4700 Insomnia, unspecified: Secondary | ICD-10-CM | POA: Insufficient documentation

## 2019-12-02 DIAGNOSIS — M419 Scoliosis, unspecified: Secondary | ICD-10-CM | POA: Diagnosis not present

## 2019-12-02 DIAGNOSIS — G894 Chronic pain syndrome: Secondary | ICD-10-CM | POA: Diagnosis present

## 2019-12-02 MED ORDER — ZOLPIDEM TARTRATE 10 MG PO TABS: ORAL_TABLET | ORAL | 2 refills | Status: DC

## 2019-12-02 MED ORDER — MORPHINE SULFATE ER 100 MG PO TBCR: 100.0000 mg | EXTENDED_RELEASE_TABLET | Freq: Two times a day (BID) | ORAL | 0 refills | Status: DC

## 2019-12-02 MED ORDER — CYCLOBENZAPRINE HCL 5 MG PO TABS: 5.0000 mg | ORAL_TABLET | Freq: Three times a day (TID) | ORAL | 4 refills | Status: DC | PRN

## 2019-12-02 MED ORDER — METHYLPREDNISOLONE 4 MG PO TBPK: ORAL_TABLET | ORAL | 0 refills | Status: DC

## 2019-12-02 MED ORDER — ALPRAZOLAM 1 MG PO TABS: ORAL_TABLET | ORAL | 3 refills | Status: DC

## 2019-12-02 NOTE — Progress Notes (Signed)
Subjective:    Patient ID: ESTON HESLIN, male    DOB: 06/15/1971, 49 y.o.   MRN: [id number]  HPI: CONSTANTINO STARACE is a 49 y.o. male who returns for follow up appointment for chronic pain and medication refill. He states his pain is located in his upper- lower back pain radiating into his right hip and right lower extremity. Also reports two days ago he had a  Near mis- fall tripped over his bedroom slipper while going to the restroom. He was able to brace himself and didn't hit the floor. Educated on  falls prevention, he verbalizes understanding.   He rates his pain 7. His current exercise regime is walking.  Mr. Cotto Morphine equivalent is 200.00 MME.  Last UDS was Performed on 09/24/2019, it was consistent.    Pain Inventory Average Pain 7 Pain Right Now 7 My pain is intermittent, constant, sharp, burning, dull, stabbing, tingling and aching  In the last 24 hours, has pain interfered with the following? General activity 7 Relation with others 7 Enjoyment of life 7 What TIME of day is your pain at its worst? all Sleep (in general) Fair  Pain is worse with: walking, bending, sitting, inactivity, standing and some activites Pain improves with: rest, heat/ice, therapy/exercise, pacing activities and medication Relief from Meds: 5  Mobility walk without assistance how many minutes can you walk? 10 ability to climb steps?  yes do you drive?  yes  Function employed # of hrs/week . disabled: date disabled .  Neuro/Psych numbness tingling trouble walking spasms anxiety  Prior Studies Any changes since last visit?  no  Physicians involved in your care Any changes since last visit?  no   Family History  Problem Relation Age of Onset  . Hypertension Mother   . Hypertension Father    Social History   Socioeconomic History  . Marital status: Single    Spouse name: Not on file  . Number of children: Not on file  . Years of education: Not on file  . Highest  education level: Not on file  Occupational History  . Not on file  Tobacco Use  . Smoking status: Former Smoker    Quit date: 06/27/2011    Years since quitting: 8.4  . Smokeless tobacco: Never Used  Substance and Sexual Activity  . Alcohol use: Not on file  . Drug use: Not on file  . Sexual activity: Not on file  Other Topics Concern  . Not on file  Social History Narrative  . Not on file   Social Determinants of Health   Financial Resource Strain:   . Difficulty of Paying Living Expenses: Not on file  Food Insecurity:   . Worried About Programme researcher, broadcasting/film/video in the Last Year: Not on file  . Ran Out of Food in the Last Year: Not on file  Transportation Needs:   . Lack of Transportation (Medical): Not on file  . Lack of Transportation (Non-Medical): Not on file  Physical Activity:   . Days of Exercise per Week: Not on file  . Minutes of Exercise per Session: Not on file  Stress:   . Feeling of Stress : Not on file  Social Connections:   . Frequency of Communication with Friends and Family: Not on file  . Frequency of Social Gatherings with Friends and Family: Not on file  . Attends Religious Services: Not on file  . Active Member of Clubs or Organizations: Not on file  . Attends  Club or Organization Meetings: Not on file  . Marital Status: Not on file   Past Surgical History:  Procedure Laterality Date  . HERNIA REPAIR    . SPINE SURGERY    . urinary tract surgery     Past Medical History:  Diagnosis Date  . Anxiety   . Chronic pain syndrome   . 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 Facet syndrome, lumbar   . Kyphoscoliosis and scoliosis   . Lumbago    BP (!) 170/106   Pulse (!) 118   Temp 97.7 F (36.5 C)   Ht 5\' 8"  (1.727 m)   Wt 252 lb (114.3 kg)   SpO2 92%   BMI 38.32 kg/m   Opioid Risk Score:   Fall Risk Score:  `1  Depression screen PHQ 2/9  Depression screen [facility] 2/9 03/17/2019 11/18/2018 07/29/2018 01/21/2018 04/10/2016 10/24/2015 06/13/2015   Decreased Interest 0 0 0 0 0 0 0  Down, Depressed, Hopeless 0 0 0 0 0 0 0  PHQ - 2 Score 0 0 0 0 0 0 0  Altered sleeping - - - - - - -  Tired, decreased energy - - - - - - -  Change in appetite - - - - - - -  Feeling bad or failure about yourself  - - - - - - -  Trouble concentrating - - - - - - -  Moving slowly or fidgety/restless - - - - - - -  Suicidal thoughts - - - - - - -  PHQ-9 Score - - - - - - -    Review of Systems  Constitutional: Negative.   HENT: Negative.   Eyes: Negative.   Respiratory: Negative.   Cardiovascular: Negative.   Gastrointestinal: Negative.   Genitourinary: Negative.   Musculoskeletal: Positive for arthralgias, back pain, gait problem, myalgias, neck pain and neck stiffness.       Spasms   Skin: Negative.   Allergic/Immunologic: Negative.   Neurological: Positive for numbness.       Tingling   Psychiatric/Behavioral: The patient is nervous/anxious.   All other systems reviewed and are negative.      Objective:   Physical Exam Vitals and nursing note reviewed.  Constitutional:      Appearance: Normal appearance. He is obese.  Cardiovascular:     Rate and Rhythm: Normal rate and regular rhythm.     Pulses: Normal pulses.     Heart sounds: Normal heart sounds.  Musculoskeletal:     Cervical back: Normal range of motion and neck supple.  Neurological:     Mental Status: He is alert.           Assessment & Plan:  1. Acute exacerbation of Chronic Low Back Pain: RX: Medrol Dose Pak. 2. 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 3.Right Lumbar Radiculitis/ Chronic Back Pain/ Lumbar Spondylosis: MS Contin is effective for pain management and makes a great difference in his quality of Life. Continue using Pamelor for neuropathy. Also recommended alternating heat and ice for shoulder pain. 02/252021 Refilled:MS Contin 100mg  #60,---use one tablet every 12 hours.Second script sent for the following  month.12/02/2019. We will continue the opioid monitoring program, this consists of regular clinic visits, examinations, urine drug screen, pill counts as well as use of [place] Controlled Substance Reporting System. 4. Anxiety: Continue Xanax.12/01/2018 5. Insomnia: Continue Ambien.12/02/2019 6. Muscle Spasm: Continue Flexeril.Continue to monitor. 12/02/2019 7.Cervicalgia: No Complaints Today. Continue HEP as  tolerated. Continue to monitor.12/02/2019 8. Uncontrolled Hypertension: Refuses ED or Urgent Care evaluation. Mr. Stann states he is compliant with his anti-hypertensive medication. He was instructed to check his blood pressure, keep a journal and call his PCP. Also instructed to check his blood pressure and send his readings via My-Chart. He verbalizes understanding.   20 minutes of face to face patient care time was spent during this visit. All questions were encouraged and answered.  F/U in74months

## 2020-01-20 ENCOUNTER — Encounter: Payer: Medicare Other | Admitting: Registered Nurse

## 2020-01-28 ENCOUNTER — Other Ambulatory Visit: Payer: Self-pay

## 2020-01-28 ENCOUNTER — Encounter: Payer: Medicare Other | Attending: Registered Nurse | Admitting: Registered Nurse

## 2020-01-28 ENCOUNTER — Encounter: Payer: Self-pay | Admitting: Registered Nurse

## 2020-01-28 VITALS — BP 138/91 | HR 96 | Temp 98.7°F | Ht 68.0 in | Wt 253.0 lb

## 2020-01-28 DIAGNOSIS — Q8789 Other specified congenital malformation syndromes, not elsewhere classified: Secondary | ICD-10-CM | POA: Diagnosis present

## 2020-01-28 DIAGNOSIS — Z5181 Encounter for therapeutic drug level monitoring: Secondary | ICD-10-CM | POA: Diagnosis present

## 2020-01-28 DIAGNOSIS — Q870 Congenital malformation syndromes predominantly affecting facial appearance: Secondary | ICD-10-CM

## 2020-01-28 DIAGNOSIS — M7062 Trochanteric bursitis, left hip: Secondary | ICD-10-CM

## 2020-01-28 DIAGNOSIS — G894 Chronic pain syndrome: Secondary | ICD-10-CM

## 2020-01-28 DIAGNOSIS — M419 Scoliosis, unspecified: Secondary | ICD-10-CM | POA: Insufficient documentation

## 2020-01-28 DIAGNOSIS — G35 Multiple sclerosis: Secondary | ICD-10-CM | POA: Insufficient documentation

## 2020-01-28 DIAGNOSIS — Z87891 Personal history of nicotine dependence: Secondary | ICD-10-CM | POA: Insufficient documentation

## 2020-01-28 DIAGNOSIS — M47816 Spondylosis without myelopathy or radiculopathy, lumbar region: Secondary | ICD-10-CM

## 2020-01-28 DIAGNOSIS — M545 Low back pain: Secondary | ICD-10-CM | POA: Diagnosis not present

## 2020-01-28 DIAGNOSIS — G4700 Insomnia, unspecified: Secondary | ICD-10-CM | POA: Insufficient documentation

## 2020-01-28 DIAGNOSIS — G629 Polyneuropathy, unspecified: Secondary | ICD-10-CM | POA: Diagnosis not present

## 2020-01-28 DIAGNOSIS — Z79891 Long term (current) use of opiate analgesic: Secondary | ICD-10-CM | POA: Diagnosis present

## 2020-01-28 DIAGNOSIS — M62838 Other muscle spasm: Secondary | ICD-10-CM

## 2020-01-28 DIAGNOSIS — Z760 Encounter for issue of repeat prescription: Secondary | ICD-10-CM | POA: Diagnosis present

## 2020-01-28 DIAGNOSIS — I10 Essential (primary) hypertension: Secondary | ICD-10-CM | POA: Diagnosis not present

## 2020-01-28 DIAGNOSIS — M4726 Other spondylosis with radiculopathy, lumbar region: Secondary | ICD-10-CM | POA: Insufficient documentation

## 2020-01-28 DIAGNOSIS — F5101 Primary insomnia: Secondary | ICD-10-CM

## 2020-01-28 DIAGNOSIS — F419 Anxiety disorder, unspecified: Secondary | ICD-10-CM | POA: Diagnosis not present

## 2020-01-28 DIAGNOSIS — M542 Cervicalgia: Secondary | ICD-10-CM

## 2020-01-28 DIAGNOSIS — M7061 Trochanteric bursitis, right hip: Secondary | ICD-10-CM

## 2020-01-28 DIAGNOSIS — M5412 Radiculopathy, cervical region: Secondary | ICD-10-CM

## 2020-01-28 MED ORDER — ALPRAZOLAM 1 MG PO TABS: ORAL_TABLET | ORAL | 3 refills | Status: DC

## 2020-01-28 MED ORDER — MORPHINE SULFATE ER 100 MG PO TBCR: 100.0000 mg | EXTENDED_RELEASE_TABLET | Freq: Two times a day (BID) | ORAL | 0 refills | Status: DC

## 2020-01-28 MED ORDER — ZOLPIDEM TARTRATE 10 MG PO TABS: ORAL_TABLET | ORAL | 2 refills | Status: DC

## 2020-01-28 NOTE — Progress Notes (Signed)
Subjective:    Patient ID: Kenneth Farrell, male    DOB: Jan 14, 1971, 49 y.o.   MRN: [id number]  HPI: Kenneth Farrell is a 49 y.o. male who returns for follow up appointment for chronic pain and medication refill. He states his pain is located in his neck radiating into his bilateral shoulders and lower back pain radiating into his bilateral hips L>R. He rates his pain 7. His current exercise regime is walking and performing stretching exercises.  Kenneth Farrell went to [facility] ED on 12/25/2010 for shortness of breath, he was diagnosed with acute respiratory failure with hypoxia and hypercapnia, COPD exacerbation and restrictive lung disease.  He will follow up with his pulmonologist and will be going to respiratory therapy, awaiting an appointment.  He was discharged on 12/26/2019.    Kenneth Farrell Morphine equivalent is 200.00MME. He is also prescribed Alprazolam We have discussed the black box warning of using opioids and benzodiazepines. I highlighted the dangers of using these drugs together and discussed the adverse events including respiratory suppression, overdose, cognitive impairment and importance of compliance with current regimen. We will continue to monitor and adjust as indicated.   Last UDS was performed on 09/24/2019, it was consistent   Pain Inventory Average Pain 7 Pain Right Now 7 My pain is sharp, burning, dull, stabbing, tingling and aching  In the last 24 hours, has pain interfered with the following? General activity 7 Relation with others 7 Enjoyment of life 7 What TIME of day is your pain at its worst? all Sleep (in general) Fair  Pain is worse with: walking, bending, sitting, inactivity, standing, unsure and some activites Pain improves with: rest, heat/ice, therapy/exercise, pacing activities and medication Relief from Meds: 4  Mobility walk without assistance how many minutes can you walk? 10 ability to climb steps?  yes do you drive?   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  no  Physicians involved in your care Any changes since last visit?  no   Family History  Problem Relation Age of Onset  . Hypertension Mother   . Hypertension Father    Social History   Socioeconomic History  . Marital status: Single    Spouse name: Not on file  . Number of children: Not on file  . Years of education: Not on file  . Highest education level: Not on file  Occupational History  . Not on file  Tobacco Use  . Smoking status: Former Smoker    Quit date: 06/27/2011    Years since quitting: 8.5  . Smokeless tobacco: Never Used  Substance and Sexual Activity  . Alcohol use: Not on file  . Drug use: Not on file  . Sexual activity: Not on file  Other Topics Concern  . Not on file  Social History Narrative  . Not on file   Social Determinants of Health   Financial Resource Strain:   . Difficulty of Paying Living Expenses:   Food Insecurity:   . Worried About Charity fundraiser in the Last Year:   . Arboriculturist in the Last Year:   Transportation Needs:   . Film/video editor (Medical):   Marland Kitchen Lack of Transportation (Non-Medical):   Physical Activity:   . Days of Exercise per Week:   . Minutes of Exercise per Session:  Stress:   . Feeling of Stress :   Social Connections:   . Frequency of Communication with Friends and Family:   . Frequency of Social Gatherings with Friends and Family:   . Attends Religious Services:   . Active Member of Clubs or Organizations:   . Attends Banker Meetings:   Marland Kitchen Marital Status:    Past Surgical History:  Procedure Laterality Date  . HERNIA REPAIR    . SPINE SURGERY    . urinary tract surgery     Past Medical History:  Diagnosis Date  . Anxiety   . Chronic pain syndrome   . Congenital  anomalies of skull and face bones   . Facet syndrome, lumbar   . Kyphoscoliosis and scoliosis   . Lumbago    BP (!) 159/107   Pulse 98   Temp 98.7 F (37.1 C)   Ht 5\' 8"  (1.727 m)   Wt 253 lb (114.8 kg)   SpO2 98%   BMI 38.47 kg/m   Opioid Risk Score:   Fall Risk Score:  `1  Depression screen PHQ 2/9  Depression screen [facility] 2/9 03/17/2019 11/18/2018 07/29/2018 01/21/2018 04/10/2016 10/24/2015 06/13/2015  Decreased Interest 0 0 0 0 0 0 0  Down, Depressed, Hopeless 0 0 0 0 0 0 0  PHQ - 2 Score 0 0 0 0 0 0 0  Altered sleeping - - - - - - -  Tired, decreased energy - - - - - - -  Change in appetite - - - - - - -  Feeling bad or failure about yourself  - - - - - - -  Trouble concentrating - - - - - - -  Moving slowly or fidgety/restless - - - - - - -  Suicidal thoughts - - - - - - -  PHQ-9 Score - - - - - - -     Review of Systems  Musculoskeletal: Positive for gait problem.  Psychiatric/Behavioral: The patient is nervous/anxious.   All other systems reviewed and are negative.      Objective:   Physical Exam Vitals and nursing note reviewed.  Constitutional:      Appearance: Normal appearance. He is obese.  Neck:     Comments: Cervical Paraspinal Tenderness: C-5-C-6 Cardiovascular:     Rate and Rhythm: Normal rate and regular rhythm.     Pulses: Normal pulses.     Heart sounds: Normal heart sounds.  Pulmonary:     Effort: Pulmonary effort is normal.     Breath sounds: Normal breath sounds.     Comments: Continuous Oxygen 3 Liters Nasal Cannula Musculoskeletal:     Cervical back: Normal range of motion and neck supple.     Comments: Normal Muscle Bulk and Muscle Testing Reveals:  Upper Extremities: Full ROM and Muscle Strength 5/5 Bilateral AC Joint tenderness  Thoracic and Lumbar Hypersensitivity Bilateral Greater Trochanteric Tenderness  Lower Extremities: Full ROM and Muscle Strength 5/5 Arises from Table slowly Narrow Based  Gait   Skin:    General: Skin is  warm and dry.  Neurological:     Mental Status: He is alert and oriented to person, place, and time.  Psychiatric:        Mood and Affect: Mood normal.        Behavior: Behavior normal.           Assessment & Plan:  1. 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 Continue using Pamelor for neuropathy. Also recommended alternating heat and ice for shoulder pain. 01/28/2020. Refilled:MS Contin 100mg  #60,---use one tablet every 12 hours.Second script sent for the following month.01/28/2020 We will continue the opioid monitoring program, this consists of regular clinic visits, examinations, urine drug screen, pill counts as well as use of 01/30/2020 Controlled Substance Reporting System. 3. Anxiety: Continue Xanax.Continue to Monitor.01/28/2020 4. Insomnia: Continue Ambien.Continue to Monitor. 01/28/2020. 5. Muscle Spasm: Continue Flexeril.Continue to monitor. 01/28/2020. 6.Cervicalgia: Continue HEP as tolerated. Continue to monitor.01/28/2020  01/30/2020 of face to face patient care time was spent during this visit. All questions were encouraged and answered.  F/U in17months

## 2020-03-23 ENCOUNTER — Ambulatory Visit: Payer: Medicare Other | Admitting: Registered Nurse

## 2020-03-31 ENCOUNTER — Encounter: Payer: Self-pay | Admitting: Registered Nurse

## 2020-03-31 ENCOUNTER — Other Ambulatory Visit: Payer: Self-pay

## 2020-03-31 ENCOUNTER — Encounter: Payer: Medicare Other | Attending: Registered Nurse | Admitting: Registered Nurse

## 2020-03-31 VITALS — BP 157/99 | HR 94 | Temp 97.9°F | Ht 68.0 in | Wt 264.6 lb

## 2020-03-31 DIAGNOSIS — G35 Multiple sclerosis: Secondary | ICD-10-CM | POA: Diagnosis not present

## 2020-03-31 DIAGNOSIS — Z79891 Long term (current) use of opiate analgesic: Secondary | ICD-10-CM | POA: Diagnosis present

## 2020-03-31 DIAGNOSIS — M47816 Spondylosis without myelopathy or radiculopathy, lumbar region: Secondary | ICD-10-CM | POA: Diagnosis present

## 2020-03-31 DIAGNOSIS — M62838 Other muscle spasm: Secondary | ICD-10-CM | POA: Insufficient documentation

## 2020-03-31 DIAGNOSIS — M545 Low back pain: Secondary | ICD-10-CM | POA: Diagnosis not present

## 2020-03-31 DIAGNOSIS — M5416 Radiculopathy, lumbar region: Secondary | ICD-10-CM

## 2020-03-31 DIAGNOSIS — M419 Scoliosis, unspecified: Secondary | ICD-10-CM | POA: Diagnosis not present

## 2020-03-31 DIAGNOSIS — Q8789 Other specified congenital malformation syndromes, not elsewhere classified: Secondary | ICD-10-CM | POA: Insufficient documentation

## 2020-03-31 DIAGNOSIS — M7062 Trochanteric bursitis, left hip: Secondary | ICD-10-CM

## 2020-03-31 DIAGNOSIS — G4700 Insomnia, unspecified: Secondary | ICD-10-CM | POA: Diagnosis not present

## 2020-03-31 DIAGNOSIS — G894 Chronic pain syndrome: Secondary | ICD-10-CM | POA: Diagnosis not present

## 2020-03-31 DIAGNOSIS — Z760 Encounter for issue of repeat prescription: Secondary | ICD-10-CM | POA: Diagnosis present

## 2020-03-31 DIAGNOSIS — M7061 Trochanteric bursitis, right hip: Secondary | ICD-10-CM

## 2020-03-31 DIAGNOSIS — Z5181 Encounter for therapeutic drug level monitoring: Secondary | ICD-10-CM

## 2020-03-31 DIAGNOSIS — I10 Essential (primary) hypertension: Secondary | ICD-10-CM | POA: Diagnosis not present

## 2020-03-31 DIAGNOSIS — M4726 Other spondylosis with radiculopathy, lumbar region: Secondary | ICD-10-CM | POA: Insufficient documentation

## 2020-03-31 DIAGNOSIS — Z87891 Personal history of nicotine dependence: Secondary | ICD-10-CM | POA: Diagnosis not present

## 2020-03-31 DIAGNOSIS — F419 Anxiety disorder, unspecified: Secondary | ICD-10-CM | POA: Insufficient documentation

## 2020-03-31 DIAGNOSIS — G629 Polyneuropathy, unspecified: Secondary | ICD-10-CM | POA: Diagnosis not present

## 2020-03-31 DIAGNOSIS — Q870 Congenital malformation syndromes predominantly affecting facial appearance: Secondary | ICD-10-CM

## 2020-03-31 DIAGNOSIS — M542 Cervicalgia: Secondary | ICD-10-CM

## 2020-03-31 DIAGNOSIS — M5412 Radiculopathy, cervical region: Secondary | ICD-10-CM

## 2020-03-31 MED ORDER — MORPHINE SULFATE ER 100 MG PO TBCR: 100.0000 mg | EXTENDED_RELEASE_TABLET | Freq: Two times a day (BID) | ORAL | 0 refills | Status: DC

## 2020-03-31 NOTE — Progress Notes (Signed)
Subjective:    Patient ID: Kenneth Farrell, male    DOB: 05-03-1971, 49 y.o.   MRN: [id number]  HPI: Kenneth Farrell is a 49 y.o. male who returns for follow up appointment for chronic pain and medication refill. He states his pain is located in his neck radiating into his bilateral shoulders R>L, upper- lower back radiating into his bilateral hips and bilateral lower extremities. He  rates his pain 7. His current exercise regime is walking and performing stretching exercises.  Mr. Caligiuri arrived hypertensive, blood pressure was re-checked. Mr. Spraggins was instructed to keep a blood pressure log and follow up with his PCP. He verbalizes understanding.   Mr. Helbing Morphine equivalent is 200.00MME.  UDS ordered Today.     Review of Systems  Constitutional: Negative.   HENT: Negative.   Eyes: Negative.   Respiratory: Negative.   Cardiovascular: Negative.   Endocrine: Negative.   Genitourinary: Negative.   Musculoskeletal: Positive for gait problem.       Spasms  Skin: Negative.   Neurological: Positive for numbness.       Tingling  Psychiatric/Behavioral:       Anxiety        Pain Inventory Average Pain 7 Pain Right Now 7 My pain is intermittent, constant, sharp, burning, dull, stabbing, tingling and aching  In the last 24 hours, has pain interfered with the following? General activity 7 Relation with others 7 Enjoyment of life 7 What TIME of day is your pain at its worst? morning, daytime, night & evening. Sleep (in general) Fair  Pain is worse with: walking, bending, sitting, inactivity, standing, unsure and some activites Pain improves with: rest, heat/ice, therapy/exercise, pacing activities and medication Relief from Meds: 4  Mobility walk without assistance how many minutes can you walk? 5-10 mins. ability to climb steps?  yes do you drive?  yes Do you have any goals in this area?  yes  Function disabled: date disabled  11  Neuro/Psych weakness tingling trouble walking spasms anxiety  Prior Studies Any changes since last visit?  no  Physicians involved in your care Any changes since last visit?  no   Family History  Problem Relation Age of Onset  . Hypertension Mother   . Hypertension Father    Social History   Socioeconomic History  . Marital status: Single    Spouse name: Not on file  . Number of children: Not on file  . Years of education: Not on file  . Highest education level: Not on file  Occupational History  . Not on file  Tobacco Use  . Smoking status: Former Smoker    Quit date: 06/27/2011    Years since quitting: 8.7  . Smokeless tobacco: Never Used  Substance and Sexual Activity  . Alcohol use: Not on file  . Drug use: Not on file  . Sexual activity: Not on file  Other Topics Concern  . Not on file  Social History Narrative  . Not on file   Social Determinants of Health   Financial Resource Strain:   . Difficulty of Paying Living Expenses:   Food Insecurity:   . Worried About Charity fundraiser in the Last Year:   . Arboriculturist in the Last Year:   Transportation Needs:   . Film/video editor (Medical):   Marland Kitchen Lack of Transportation (Non-Medical):   Physical Activity:   . Days of Exercise per Week:   . Minutes of Exercise per Session:  Stress:   . Feeling of Stress :   Social Connections:   . Frequency of Communication with Friends and Family:   . Frequency of Social Gatherings with Friends and Family:   . Attends Religious Services:   . Active Member of Clubs or Organizations:   . Attends Banker Meetings:   Marland Kitchen Marital Status:    Past Surgical History:  Procedure Laterality Date  . HERNIA REPAIR    . SPINE SURGERY    . urinary tract surgery     Past Medical History:  Diagnosis Date  . Anxiety   . Chronic pain syndrome   . 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 Facet syndrome, lumbar   . Kyphoscoliosis and  scoliosis   . Lumbago    BP (!) 162/104   Temp 97.9 F (36.6 C)   Ht 5\' 8"  (1.727 m)   Wt 264 lb 9.6 oz (120 kg)   SpO2 100% Comment: 3 LT O2  BMI 40.23 kg/m   Opioid Risk Score:   Fall Risk Score:  `1  Depression screen PHQ 2/9  Depression screen [facility] 2/9 03/17/2019 11/18/2018 07/29/2018 01/21/2018 04/10/2016 10/24/2015 06/13/2015  Decreased Interest 0 0 0 0 0 0 0  Down, Depressed, Hopeless 0 0 0 0 0 0 0  PHQ - 2 Score 0 0 0 0 0 0 0  Altered sleeping - - - - - - -  Tired, decreased energy - - - - - - -  Change in appetite - - - - - - -  Feeling bad or failure about yourself  - - - - - - -  Trouble concentrating - - - - - - -  Moving slowly or fidgety/restless - - - - - - -  Suicidal thoughts - - - - - - -  PHQ-9 Score - - - - - - -   Objective:   Physical Exam Vitals and nursing note reviewed.  Constitutional:      Appearance: Normal appearance.  Neck:     Comments: Cervical Paraspinal Tenderness: C-5-C-6 Cardiovascular:     Rate and Rhythm: Normal rate and regular rhythm.     Pulses: Normal pulses.     Heart sounds: Normal heart sounds.  Pulmonary:     Effort: Pulmonary effort is normal.     Breath sounds: Normal breath sounds.  Musculoskeletal:     Cervical back: Normal range of motion and neck supple.     Comments: Normal Muscle Bulk and Muscle Testing Reveals:  Upper Extremities: Full ROM and Muscle Strength 5/5 Thoracic and Lumbar Hypersensitivity Lower Extremities: Full ROM and Muscle Strength 5/5 Arises from Table Slowly Narrow Based Gait   Skin:    General: Skin is warm and dry.  Neurological:     Mental Status: He is alert and oriented to person, place, and time.  Psychiatric:        Mood and Affect: Mood normal.        Behavior: Behavior normal.           Assessment & Plan:  1. 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 Lumbar Radiculitis/ Chronic Back Pain/ Lumbar Spondylosis: MS Contin is effective for pain  management and makes a great difference in his quality of Life. Continue using Pamelor for neuropathy. Also recommended alternating heat and ice for shoulder pain. 03/31/2020. Refilled:MS Contin 100mg  #60,---use one tablet every 12 hours.Second script sent for the following month.03/31/2020 We will continue  the opioid monitoring program, this consists of regular clinic visits, examinations, urine drug screen, pill counts as well as use of [place] Controlled Substance Reporting System. 3. Anxiety: Continue Xanax.Continue to Monitor.03/31/2020 4. Insomnia: Continue Ambien.Continue to Monitor. 03/31/2020. 5. Muscle Spasm: Continue Flexeril.Continue to monitor.03/31/2020. 6.Cervicalgia: Cervical Radiculitis Continue HEP as tolerated. Continue to monitor.03/31/2020 7. Hypertension: Blood Pressure Re-checked. Keep Blood Pressure Log. F/U with PCP  20 minutes of face to face patient care time was spent during this visit. All questions were encouraged and answered.  F/U in58months

## 2020-04-06 LAB — TOXASSURE SELECT,+ANTIDEPR,UR

## 2020-04-12 ENCOUNTER — Other Ambulatory Visit: Payer: Self-pay | Admitting: Registered Nurse

## 2020-04-14 ENCOUNTER — Telehealth: Payer: Self-pay | Admitting: *Deleted

## 2020-04-14 NOTE — Telephone Encounter (Signed)
Urine drug screen for this encounter is consistent for prescribed medication 

## 2020-04-28 ENCOUNTER — Ambulatory Visit: Payer: Medicare Other | Admitting: Registered Nurse

## 2020-05-29 ENCOUNTER — Encounter: Payer: Medicare Other | Admitting: Registered Nurse

## 2020-05-30 ENCOUNTER — Encounter: Payer: Medicare Other | Attending: Registered Nurse | Admitting: Registered Nurse

## 2020-05-30 ENCOUNTER — Encounter: Payer: Self-pay | Admitting: Registered Nurse

## 2020-05-30 ENCOUNTER — Other Ambulatory Visit: Payer: Self-pay

## 2020-05-30 VITALS — BP 146/95 | HR 100 | Temp 98.8°F | Ht 68.0 in | Wt 269.0 lb

## 2020-05-30 DIAGNOSIS — G4700 Insomnia, unspecified: Secondary | ICD-10-CM | POA: Insufficient documentation

## 2020-05-30 DIAGNOSIS — M25512 Pain in left shoulder: Secondary | ICD-10-CM

## 2020-05-30 DIAGNOSIS — M5412 Radiculopathy, cervical region: Secondary | ICD-10-CM

## 2020-05-30 DIAGNOSIS — G894 Chronic pain syndrome: Secondary | ICD-10-CM | POA: Diagnosis present

## 2020-05-30 DIAGNOSIS — M4726 Other spondylosis with radiculopathy, lumbar region: Secondary | ICD-10-CM | POA: Insufficient documentation

## 2020-05-30 DIAGNOSIS — Z5181 Encounter for therapeutic drug level monitoring: Secondary | ICD-10-CM | POA: Diagnosis present

## 2020-05-30 DIAGNOSIS — M419 Scoliosis, unspecified: Secondary | ICD-10-CM | POA: Insufficient documentation

## 2020-05-30 DIAGNOSIS — M25511 Pain in right shoulder: Secondary | ICD-10-CM

## 2020-05-30 DIAGNOSIS — M542 Cervicalgia: Secondary | ICD-10-CM

## 2020-05-30 DIAGNOSIS — M545 Low back pain: Secondary | ICD-10-CM | POA: Insufficient documentation

## 2020-05-30 DIAGNOSIS — Z87891 Personal history of nicotine dependence: Secondary | ICD-10-CM | POA: Insufficient documentation

## 2020-05-30 DIAGNOSIS — I10 Essential (primary) hypertension: Secondary | ICD-10-CM | POA: Diagnosis not present

## 2020-05-30 DIAGNOSIS — G35 Multiple sclerosis: Secondary | ICD-10-CM | POA: Diagnosis not present

## 2020-05-30 DIAGNOSIS — G629 Polyneuropathy, unspecified: Secondary | ICD-10-CM | POA: Diagnosis not present

## 2020-05-30 DIAGNOSIS — M62838 Other muscle spasm: Secondary | ICD-10-CM | POA: Diagnosis not present

## 2020-05-30 DIAGNOSIS — M7061 Trochanteric bursitis, right hip: Secondary | ICD-10-CM

## 2020-05-30 DIAGNOSIS — M5416 Radiculopathy, lumbar region: Secondary | ICD-10-CM

## 2020-05-30 DIAGNOSIS — Q8789 Other specified congenital malformation syndromes, not elsewhere classified: Secondary | ICD-10-CM | POA: Diagnosis present

## 2020-05-30 DIAGNOSIS — Z760 Encounter for issue of repeat prescription: Secondary | ICD-10-CM | POA: Diagnosis present

## 2020-05-30 DIAGNOSIS — G8929 Other chronic pain: Secondary | ICD-10-CM

## 2020-05-30 DIAGNOSIS — Z79891 Long term (current) use of opiate analgesic: Secondary | ICD-10-CM

## 2020-05-30 DIAGNOSIS — M7062 Trochanteric bursitis, left hip: Secondary | ICD-10-CM

## 2020-05-30 DIAGNOSIS — F5101 Primary insomnia: Secondary | ICD-10-CM

## 2020-05-30 DIAGNOSIS — F419 Anxiety disorder, unspecified: Secondary | ICD-10-CM | POA: Diagnosis not present

## 2020-05-30 DIAGNOSIS — Q870 Congenital malformation syndromes predominantly affecting facial appearance: Secondary | ICD-10-CM

## 2020-05-30 DIAGNOSIS — M47816 Spondylosis without myelopathy or radiculopathy, lumbar region: Secondary | ICD-10-CM | POA: Diagnosis not present

## 2020-05-30 MED ORDER — MORPHINE SULFATE ER 100 MG PO TBCR: 100.0000 mg | EXTENDED_RELEASE_TABLET | Freq: Two times a day (BID) | ORAL | 0 refills | Status: DC

## 2020-05-30 MED ORDER — ALPRAZOLAM 1 MG PO TABS: ORAL_TABLET | ORAL | 3 refills | Status: DC

## 2020-05-30 MED ORDER — ZOLPIDEM TARTRATE 10 MG PO TABS: ORAL_TABLET | ORAL | 2 refills | Status: DC

## 2020-05-30 NOTE — Progress Notes (Addendum)
Subjective:    Patient ID: Kenneth Farrell, male    DOB: 1970/12/07, 49 y.o.   MRN: [id number]  HPI: Kenneth Farrell is a 49 y.o. male who returns for follow up appointment for chronic pain and medication refill. He states his pain is located in his neck radiating into his bilateral shoulders, mid- lower back pain radiating into his bilateral hips and bilateral lower extremities. He rates his pain 7. His  current exercise regime is attending pulmonary rehabilitation three days a week, walking and performing stretching exercises.  Kenneth Farrell arrived hypertensive, blood pressure was re-checked.   Kenneth Farrell Morphine equivalent is 200.00 MME.  He is also prescribed Alprazolam. We have discussed the black box warning of using opioids and benzodiazepines. I highlighted the dangers of using these drugs together and discussed the adverse events including respiratory suppression, overdose, cognitive impairment and importance of compliance with current regimen. We will continue to monitor and adjust as indicated.   Last UDS was Performed on 03/31/2020, it was consistent.   Pain Inventory Average Pain 7 Pain Right Now 7 My pain is intermittent, constant, sharp, burning, dull, stabbing, tingling and aching  In the last 24 hours, has pain interfered with the following? General activity 7 Relation with others 7 Enjoyment of life 7 What TIME of day is your pain at its worst? morning , daytime, evening and night Sleep (in general) Poor  Pain is worse with: walking, bending, sitting, inactivity, standing, unsure and some activites Pain improves with: rest, heat/ice, therapy/exercise, pacing activities and medication Relief from Meds: 4  Family History  Problem Relation Age of Onset  . Hypertension Mother   . Hypertension Father    Social History   Socioeconomic History  . Marital status: Single    Spouse name: Not on file  . Number of children: Not on file  . Years of education: Not on file  .  Highest education level: Not on file  Occupational History  . Not on file  Tobacco Use  . Smoking status: Former Smoker    Quit date: 06/27/2011    Years since quitting: 8.9  . Smokeless tobacco: Never Used  Substance and Sexual Activity  . Alcohol use: Not on file  . Drug use: Not on file  . Sexual activity: Not on file  Other Topics Concern  . Not on file  Social History Narrative  . Not on file   Social Determinants of Health   Financial Resource Strain:   . Difficulty of Paying Living Expenses: Not on file  Food Insecurity:   . Worried About Programme researcher, broadcasting/film/video in the Last Year: Not on file  . Ran Out of Food in the Last Year: Not on file  Transportation Needs:   . Lack of Transportation (Medical): Not on file  . Lack of Transportation (Non-Medical): Not on file  Physical Activity:   . Days of Exercise per Week: Not on file  . Minutes of Exercise per Session: Not on file  Stress:   . Feeling of Stress : Not on file  Social Connections:   . Frequency of Communication with Friends and Family: Not on file  . Frequency of Social Gatherings with Friends and Family: Not on file  . Attends Religious Services: Not on file  . Active Member of Clubs or Organizations: Not on file  . Attends Banker Meetings: Not on file  . Marital Status: Not on file   Past Surgical History:  Procedure  Laterality Date  . HERNIA REPAIR    . SPINE SURGERY    . urinary tract surgery     Past Surgical History:  Procedure Laterality Date  . HERNIA REPAIR    . SPINE SURGERY    . urinary tract surgery     Past Medical History:  Diagnosis Date  . Anxiety   . Chronic pain syndrome   . 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 Facet syndrome, lumbar   . Kyphoscoliosis and scoliosis   . Lumbago    BP (!) 156/101   Pulse (!) 102   Temp 98.8 F (37.1 C)   Ht 5\' 8"  (1.727 m)   Wt 269 lb (122 kg)   SpO2 98%   BMI 40.90 kg/m   Opioid Risk Score:   Fall Risk Score:   `1  Depression screen PHQ 2/9  Depression screen [facility] 2/9 05/30/2020 03/31/2020 03/17/2019 11/18/2018 07/29/2018 01/21/2018 04/10/2016  Decreased Interest 0 0 0 0 0 0 0  Down, Depressed, Hopeless 0 0 0 0 0 0 0  PHQ - 2 Score 0 0 0 0 0 0 0  Altered sleeping - - - - - - -  Tired, decreased energy - - - - - - -  Change in appetite - - - - - - -  Feeling bad or failure about yourself  - - - - - - -  Trouble concentrating - - - - - - -  Moving slowly or fidgety/restless - - - - - - -  Suicidal thoughts - - - - - - -  PHQ-9 Score - - - - - - -    Review of Systems  Musculoskeletal: Positive for back pain.  All other systems reviewed and are negative.      Objective:   Physical Exam Vitals and nursing note reviewed.  Constitutional:      Appearance: Normal appearance. He is obese.  Neck:     Comments: Cervical Paraspinal Tenderness: C-5-C-6 Cardiovascular:     Rate and Rhythm: Normal rate and regular rhythm.     Pulses: Normal pulses.     Heart sounds: Normal heart sounds.  Pulmonary:     Comments: Continuous Oxygen 3 Liters nasal cannula Musculoskeletal:     Cervical back: Normal range of motion and neck supple.     Comments: Normal Muscle Bulk and Muscle Testing Reveals:  Upper Extremities: Full ROM and Muscle Strength 5/5 Bilateral AC Joint Tenderness  Thoracic and Lumbar Hypersensitivity Bilateral Greater Trochanteric Tenderness Lower Extremities: Full ROM and Muscle Strength 5/5 Arises from Table with ease Narrow Based Gait   Skin:    General: Skin is warm.  Neurological:     Mental Status: He is alert.           Assessment & Plan:  1. 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 Lumbar Radiculitis/ Chronic Back Pain/ Lumbar Spondylosis: MS Contin is effective for pain management and makes a great difference in his quality of Life. Continue using Pamelor for neuropathy. Also recommended alternating heat and ice for shoulder pain.  05/30/2020. Refilled:MS Contin 100mg  #60,---use one tablet every 12 hours.Second script sent for the following month.05/30/2020 We will continue the opioid monitoring program, this consists of regular clinic visits, examinations, urine drug screen, pill counts as well as use of Controlled Substance Reporting system. A 12 month History has been reviewed on the 06/01/2020 Controlled Substance Reporting System on 05/30/2020. 3. Anxiety:  Continue Xanax.Continue to Monitor.05/30/2020 4. Insomnia: Continue Ambien.Continue to Monitor. 05/30/2020. 5. Muscle Spasm: Continue Flexeril.Continue to monitor.05/30/2020. 6.Cervicalgia: Cervical Radiculitis Continue HEP as tolerated. Continue to monitor.05/30/2020 7. Hypertension: Blood Pressure Re-checked. Continue to Monitor. 05/30/2020  20 minutes of face to face patient care time was spent during this visit. All questions were encouraged and answered.  F/U in27months

## 2020-06-19 ENCOUNTER — Telehealth: Payer: Self-pay | Admitting: Registered Nurse

## 2020-06-19 DIAGNOSIS — G894 Chronic pain syndrome: Secondary | ICD-10-CM

## 2020-06-19 DIAGNOSIS — Q870 Congenital malformation syndromes predominantly affecting facial appearance: Secondary | ICD-10-CM

## 2020-06-19 DIAGNOSIS — M47816 Spondylosis without myelopathy or radiculopathy, lumbar region: Secondary | ICD-10-CM

## 2020-06-19 MED ORDER — MORPHINE SULFATE ER 100 MG PO TBCR: 100.0000 mg | EXTENDED_RELEASE_TABLET | Freq: Two times a day (BID) | ORAL | 0 refills | Status: DC

## 2020-06-19 NOTE — Telephone Encounter (Signed)
Received a call from [name] regarding his new pharmacy. His prescription was sent to the new pharmacy, [name] verbalizes understanding.

## 2020-07-28 ENCOUNTER — Encounter: Payer: Medicare Other | Admitting: Registered Nurse

## 2020-08-04 ENCOUNTER — Other Ambulatory Visit: Payer: Self-pay

## 2020-08-04 ENCOUNTER — Encounter: Payer: Self-pay | Admitting: Registered Nurse

## 2020-08-04 ENCOUNTER — Encounter: Payer: Medicare Other | Attending: Registered Nurse | Admitting: Registered Nurse

## 2020-08-04 VITALS — BP 153/94 | HR 98 | Temp 98.5°F | Ht 66.0 in | Wt 267.0 lb

## 2020-08-04 DIAGNOSIS — F5101 Primary insomnia: Secondary | ICD-10-CM | POA: Diagnosis present

## 2020-08-04 DIAGNOSIS — Z79891 Long term (current) use of opiate analgesic: Secondary | ICD-10-CM | POA: Diagnosis present

## 2020-08-04 DIAGNOSIS — M542 Cervicalgia: Secondary | ICD-10-CM | POA: Diagnosis not present

## 2020-08-04 DIAGNOSIS — Z5181 Encounter for therapeutic drug level monitoring: Secondary | ICD-10-CM | POA: Diagnosis present

## 2020-08-04 DIAGNOSIS — M5416 Radiculopathy, lumbar region: Secondary | ICD-10-CM | POA: Diagnosis present

## 2020-08-04 DIAGNOSIS — G894 Chronic pain syndrome: Secondary | ICD-10-CM

## 2020-08-04 DIAGNOSIS — M7062 Trochanteric bursitis, left hip: Secondary | ICD-10-CM

## 2020-08-04 DIAGNOSIS — M5412 Radiculopathy, cervical region: Secondary | ICD-10-CM

## 2020-08-04 DIAGNOSIS — Q870 Congenital malformation syndromes predominantly affecting facial appearance: Secondary | ICD-10-CM | POA: Diagnosis present

## 2020-08-04 DIAGNOSIS — M47816 Spondylosis without myelopathy or radiculopathy, lumbar region: Secondary | ICD-10-CM

## 2020-08-04 DIAGNOSIS — M7061 Trochanteric bursitis, right hip: Secondary | ICD-10-CM | POA: Diagnosis present

## 2020-08-04 MED ORDER — MORPHINE SULFATE ER 100 MG PO TBCR: 100.0000 mg | EXTENDED_RELEASE_TABLET | Freq: Two times a day (BID) | ORAL | 0 refills | Status: DC

## 2020-08-04 NOTE — Progress Notes (Signed)
Subjective:    Patient ID: Kenneth Farrell, male    DOB: [birth year], 49 y.o.   MRN: [id number]  HPI: Kenneth Farrell is a 49 y.o. male who returns for follow up appointment for chronic pain and medication refill. He states his pain is located in his neck radiating into his bilateral shoulders , lower back pain radiating into his bilateral hips and bilateral lower extremities. He rates his pain 7. His current exercise regime is walking, attending Pulmonary Rehabilitation two days a week and riding his stationary bicycle for 30 minutes daily.   Mr. Marcucci arrived to office hypertensive, blood pressure was rechecked.   Mr. Judice Morphine equivalent is 200.00 MME. He  is also prescribed Alprazolam. We have discussed the black box warning of using opioids and benzodiazepines. I highlighted the dangers of using these drugs together and discussed the adverse events including respiratory suppression, overdose, cognitive impairment and importance of compliance with current regimen. We will continue to monitor and adjust as indicated.    Last UDS was Performed on 03/31/2020, it was consistent.    Pain Inventory Average Pain 7 Pain Right Now 7 My pain is constant, sharp, burning, dull, stabbing, tingling and aching  In the last 24 hours, has pain interfered with the following? General activity 7 Relation with others 7 Enjoyment of life 7 What TIME of day is your pain at its worst? morning , daytime, evening and night Sleep (in general) Fair  Pain is worse with: walking, bending, sitting, inactivity, standing and some activites Pain improves with: rest, heat/ice, therapy/exercise, pacing activities and medication Relief from Meds: 4  Family History  Problem Relation Age of Onset   Hypertension Mother    Hypertension Father    Social History   Socioeconomic History   Marital status: Single    Spouse name: Not on file   Number of children: Not on file   Years of education: Not on file     Highest education level: Not on file  Occupational History   Not on file  Tobacco Use   Smoking status: Former Smoker    Quit date: 06/27/2011    Years since quitting: 9.1   Smokeless tobacco: Never Used  Substance and Sexual Activity   Alcohol use: Not on file   Drug use: Not on file   Sexual activity: Not on file  Other Topics Concern   Not on file  Social History Narrative   Not on file   Social Determinants of Health   Financial Resource Strain:    Difficulty of Paying Living Expenses: Not on file  Food Insecurity:    Worried About Running Out of Food in the Last Year: Not on file   Ran Out of Food in the Last Year: Not on file  Transportation Needs:    Lack of Transportation (Medical): Not on file   Lack of Transportation (Non-Medical): Not on file  Physical Activity:    Days of Exercise per Week: Not on file   Minutes of Exercise per Session: Not on file  Stress:    Feeling of Stress : Not on file  Social Connections:    Frequency of Communication with Friends and Family: Not on file   Frequency of Social Gatherings with Friends and Family: Not on file   Attends Religious Services: Not on file   Active Member of Clubs or Organizations: Not on file   Attends Banker Meetings: Not on file   Marital Status: Not on  file   Past Surgical History:  Procedure Laterality Date   HERNIA REPAIR     SPINE SURGERY     urinary tract surgery     Past Surgical History:  Procedure Laterality Date   HERNIA REPAIR     SPINE SURGERY     urinary tract surgery     Past Medical History:  Diagnosis Date   Anxiety    Chronic pain syndrome    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    Facet syndrome, lumbar    Kyphoscoliosis and scoliosis    Lumbago    BP (!) 174/107    Pulse (!) 109    Temp 98.5 F (36.9 C)    Ht 5\' 6"  (1.676 m)    Wt 267 lb (121.1 kg)    SpO2 99%    BMI 43.09 kg/m   Opioid Risk Score:   Fall Risk  Score:  `1  Depression screen PHQ 2/9  Depression screen [facility] 2/9 05/30/2020 03/31/2020 03/17/2019 11/18/2018 07/29/2018 01/21/2018 04/10/2016  Decreased Interest 0 0 0 0 0 0 0  Down, Depressed, Hopeless 0 0 0 0 0 0 0  PHQ - 2 Score 0 0 0 0 0 0 0  Altered sleeping - - - - - - -  Tired, decreased energy - - - - - - -  Change in appetite - - - - - - -  Feeling bad or failure about yourself  - - - - - - -  Trouble concentrating - - - - - - -  Moving slowly or fidgety/restless - - - - - - -  Suicidal thoughts - - - - - - -  PHQ-9 Score - - - - - - -    Review of Systems  Constitutional: Negative.   HENT: Negative.   Eyes: Negative.   Respiratory: Positive for shortness of breath.   Cardiovascular: Negative.   Endocrine: Negative.   Genitourinary: Negative.   Musculoskeletal: Positive for arthralgias, back pain, gait problem, neck pain and neck stiffness.  Skin: Negative.   Allergic/Immunologic: Negative.   Hematological: Negative.   All other systems reviewed and are negative.      Objective:   Physical Exam Vitals and nursing note reviewed.  Constitutional:      Appearance: Normal appearance. He is obese.  Cardiovascular:     Rate and Rhythm: Normal rate and regular rhythm.     Pulses: Normal pulses.     Heart sounds: Normal heart sounds.  Pulmonary:     Effort: Pulmonary effort is normal.     Breath sounds: Normal breath sounds.  Musculoskeletal:     Cervical back: Normal range of motion and neck supple.     Comments: Normal Muscle Bulk and Muscle Testing Reveals:  Upper Extremities: Full ROM and Muscle Strength 5/5 Thoracic Paraspinal Tenderness: T-1-T-3 Lumbar Hypersensitivity Bilateral Greater Trochanter Tenderness Lower Extremities: Full ROM and Muscle Strength 5/5 Arises from Table Slowly Narrow Based  Gait   Skin:    General: Skin is warm and dry.  Neurological:     Mental Status: He is alert and oriented to person, place, and time.  Psychiatric:        Mood  and Affect: Mood normal.        Behavior: Behavior normal.           Assessment & Plan:  1. 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 Lumbar Radiculitis/ Chronic Back Pain/ Lumbar Spondylosis: MS Contin  is effective for pain management and makes a great difference in his quality of Life. Continue using Pamelor for neuropathy. Also recommended alternating heat and ice for shoulder pain. 08/04/2020. Refilled:MS Contin 100mg  #60,---use one tablet every 12 hours.Second script sent for the following month.08/04/2020 We will continue the opioid monitoring program, this consists of regular clinic visits, examinations, urine drug screen, pill counts as well as use of 08/06/2020 Controlled Substance Reporting system. A 12 month History has been reviewed on the [place] Controlled Substance Reporting System on 08/04/2020. 3. Anxiety: Continue Xanax.Continue to Monitor.08/04/2020 4. Insomnia: Continue Ambien.Continue to Monitor. 08/04/2020. 5. Muscle Spasm: Continue Flexeril.Continue to monitor.08/04/2020. 6.Cervicalgia:Cervical RadiculitisContinue HEP as tolerated. Continue to monitor.08/04/2020  08/06/2020 of face to face patient care time was spent during this visit. All questions were encouraged and answered.  F/U in81months

## 2020-09-18 ENCOUNTER — Other Ambulatory Visit: Payer: Self-pay | Admitting: Registered Nurse

## 2020-09-18 DIAGNOSIS — G894 Chronic pain syndrome: Secondary | ICD-10-CM

## 2020-09-18 DIAGNOSIS — M47816 Spondylosis without myelopathy or radiculopathy, lumbar region: Secondary | ICD-10-CM

## 2020-09-22 ENCOUNTER — Other Ambulatory Visit: Payer: Self-pay

## 2020-09-22 ENCOUNTER — Encounter: Payer: Self-pay | Admitting: Registered Nurse

## 2020-09-22 ENCOUNTER — Encounter: Payer: Medicare Other | Attending: Registered Nurse | Admitting: Registered Nurse

## 2020-09-22 VITALS — BP 152/89 | HR 106 | Temp 99.0°F | Ht 66.5 in | Wt 267.0 lb

## 2020-09-22 DIAGNOSIS — M542 Cervicalgia: Secondary | ICD-10-CM | POA: Diagnosis present

## 2020-09-22 DIAGNOSIS — M7062 Trochanteric bursitis, left hip: Secondary | ICD-10-CM | POA: Diagnosis present

## 2020-09-22 DIAGNOSIS — Z79891 Long term (current) use of opiate analgesic: Secondary | ICD-10-CM | POA: Diagnosis not present

## 2020-09-22 DIAGNOSIS — M7061 Trochanteric bursitis, right hip: Secondary | ICD-10-CM

## 2020-09-22 DIAGNOSIS — G894 Chronic pain syndrome: Secondary | ICD-10-CM | POA: Diagnosis present

## 2020-09-22 DIAGNOSIS — Z5181 Encounter for therapeutic drug level monitoring: Secondary | ICD-10-CM

## 2020-09-22 DIAGNOSIS — M5416 Radiculopathy, lumbar region: Secondary | ICD-10-CM | POA: Diagnosis present

## 2020-09-22 DIAGNOSIS — M5412 Radiculopathy, cervical region: Secondary | ICD-10-CM | POA: Diagnosis present

## 2020-09-22 DIAGNOSIS — M47816 Spondylosis without myelopathy or radiculopathy, lumbar region: Secondary | ICD-10-CM

## 2020-09-22 DIAGNOSIS — Q870 Congenital malformation syndromes predominantly affecting facial appearance: Secondary | ICD-10-CM

## 2020-09-22 MED ORDER — MORPHINE SULFATE ER 100 MG PO TBCR: 100.0000 mg | EXTENDED_RELEASE_TABLET | Freq: Two times a day (BID) | ORAL | 0 refills | Status: DC

## 2020-09-22 MED ORDER — NORTRIPTYLINE HCL 50 MG PO CAPS: 100.0000 mg | ORAL_CAPSULE | Freq: Every day | ORAL | 1 refills | Status: DC

## 2020-09-22 MED ORDER — ALPRAZOLAM 1 MG PO TABS: ORAL_TABLET | ORAL | 3 refills | Status: DC

## 2020-09-22 MED ORDER — ZOLPIDEM TARTRATE 10 MG PO TABS: ORAL_TABLET | ORAL | 2 refills | Status: DC

## 2020-09-22 NOTE — Progress Notes (Signed)
Subjective:    Patient ID: Kenneth Farrell, male    DOB: 05-08-1971, 49 y.o.   MRN: [id number]  HPI: Kenneth Farrell is a 49 y.o. male who returns for follow up appointment for chronic pain and medication refill. He states his pain is located in his neck radiating into his left shoulder and lower back pain radiating into his bilateral hips and bilateral lower extremities. He rates his pain 7. His  current exercise regime is walking and performing stretching exercises.  Ms. Millspaugh Morphine equivalent is 200.00 MME.He is also prescribed Alprazolam. We have discussed the black box warning of using opioids and benzodiazepines. I highlighted the dangers of using these drugs together and discussed the adverse events including respiratory suppression, overdose, cognitive impairment and importance of compliance with current regimen. We will continue to monitor and adjust as indicated.     UDS ordered today.   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    Pain Inventory Average Pain 7 Pain Right Now 7 My pain is constant, sharp, burning, dull, stabbing, tingling and aching  In the last 24 hours, has pain interfered with the following? General activity 7 Relation with others 7 Enjoyment of life 7 What TIME of day is your pain at its worst? morning , daytime, evening and night Sleep (in general) Fair  Pain is worse with: walking, bending, sitting, inactivity, standing and some activites Pain improves with: rest, heat/ice, therapy/exercise, pacing activities and medication Relief from Meds: 4  Family History  Problem Relation Age of Onset  . Hypertension Mother   . Hypertension Father    Social History   Socioeconomic History  . Marital status: Single    Spouse name: Not on file  . Number of children: Not on file  . Years of education: Not on file  . Highest education level: Not on file  Occupational History  . Not on file  Tobacco Use  . Smoking status: Former Smoker     Quit date: 06/27/2011    Years since quitting: 9.2  . Smokeless tobacco: Never Used  Substance and Sexual Activity  . Alcohol use: Not on file  . Drug use: Not on file  . Sexual activity: Not on file  Other Topics Concern  . Not on file  Social History Narrative  . Not on file   Social Determinants of Health   Financial Resource Strain: Not on file  Food Insecurity: Not on file  Transportation Needs: Not on file  Physical Activity: Not on file  Stress: Not on file  Social Connections: Not on file   Past Surgical History:  Procedure Laterality Date  . HERNIA REPAIR    . SPINE SURGERY    . urinary tract surgery     Past Surgical History:  Procedure Laterality Date  . HERNIA REPAIR    . SPINE SURGERY    . urinary tract surgery     Past Medical History:  Diagnosis Date  . Anxiety   . Chronic pain syndrome   . 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 Facet syndrome, lumbar   . Kyphoscoliosis and scoliosis   . Lumbago    BP (!) 152/89   Pulse (!) 106   Temp 99 F (37.2 C)   Ht 5' 6.5" (1.689 m)   Wt 267 lb (121.1 kg)   SpO2 98%   BMI 42.45 kg/m   Opioid Risk Score:   Fall Risk Score:  `1  Depression screen PHQ 2/9  Depression screen [facility] 2/9 05/30/2020 03/31/2020 03/17/2019 11/18/2018 07/29/2018 01/21/2018 04/10/2016  Decreased Interest 0 0 0 0 0 0 0  Down, Depressed, Hopeless 0 0 0 0 0 0 0  PHQ - 2 Score 0 0 0 0 0 0 0  Altered sleeping - - - - - - -  Tired, decreased energy - - - - - - -  Change in appetite - - - - - - -  Feeling bad or failure about yourself  - - - - - - -  Trouble concentrating - - - - - - -  Moving slowly or fidgety/restless - - - - - - -  Suicidal thoughts - - - - - - -  PHQ-9 Score - - - - - - -    Review of Systems  Constitutional: Negative.   HENT: Negative.   Eyes: Negative.   Respiratory: Positive for shortness of breath.   Cardiovascular: Negative.   Gastrointestinal: Negative.   Endocrine: Negative.   Genitourinary:  Negative.   Musculoskeletal: Positive for arthralgias, back pain, gait problem, myalgias, neck pain and neck stiffness.  Skin: Negative.   Allergic/Immunologic: Negative.   Hematological: Negative.   Psychiatric/Behavioral: Negative.   All other systems reviewed and are negative.      Objective:   Physical Exam Vitals and nursing note reviewed.  Constitutional:      Appearance: Normal appearance. He is obese.  Neck:     Comments: Cervical Paraspinal Tenderness: C-5-C-6 Mainly Left  Side  Cardiovascular:     Rate and Rhythm: Normal rate and regular rhythm.     Pulses: Normal pulses.     Heart sounds: Normal heart sounds.  Pulmonary:     Effort: Pulmonary effort is normal.     Breath sounds: Normal breath sounds.     Comments: Continuous Oxygen 3 Liters Nasal Cannula Musculoskeletal:     Cervical back: Normal range of motion and neck supple.     Comments: Normal Muscle Bulk and Muscle Testing Reveals:  Upper Extremities: Full ROM and Muscle Strength 5/5 Left AC Joint Tenderness Thoracic Paraspinal Tenderness: T-7-T-9 Lumbar Paraspinal Tenderness: L-3-L-5 Bilateral Greater Trochanter Tenderness Lower Extremities: Full ROM and Muscle Strength 5/5 Arises from Table Slowly Narrow Based Gait   Skin:    General: Skin is warm and dry.  Neurological:     Mental Status: He is alert and oriented to person, place, and time.  Psychiatric:        Mood and Affect: Mood normal.        Behavior: Behavior normal.           Assessment & Plan:  1. 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 Lumbar Radiculitis/ Chronic Back Pain/ Lumbar Spondylosis: MS Contin is effective for pain management and makes a great difference in his quality of Life. Continue using Pamelor for neuropathy. Also recommended alternating heat and ice for shoulder pain. 09/22/2020. Refilled:MS Contin 100mg  #60,---use one tablet every 12 hours.Second script sent for the  following month.09/22/2020 We will continue the opioid monitoring program, this consists of regular clinic visits, examinations, urine drug screen, pill counts as well as use of 09/24/2020 Controlled Substance Reporting system. A 12 month History has been reviewed on the [place] Controlled Substance Reporting Systemon 09/22/2020. 3. Anxiety: Continue Xanax.Continue to Monitor.09/22/2020 4. Insomnia: Continue Ambien.Continue to Monitor. 09/22/2020. 5. Muscle Spasm: Continue Flexeril.Continue to monitor.09/22/2020. 6.Cervicalgia:Cervical RadiculitisContinue HEP as tolerated. Continue to monitor.09/22/2020 7. Bilateral  Greater Trochanter Bursitis: Continue to Alternate Ice and Heat Therapy. Continue current medication. Continue to Monitor.   of face to face patient care time was spent during this visit. All questions were encouraged and answered.  F/U in110months

## 2020-10-02 LAB — TOXASSURE SELECT,+ANTIDEPR,UR

## 2020-10-10 ENCOUNTER — Telehealth: Payer: Self-pay | Admitting: *Deleted

## 2020-10-10 NOTE — Telephone Encounter (Signed)
Urine drug screen for this encounter is consistent for prescribed medication 

## 2020-11-24 ENCOUNTER — Encounter: Payer: Medicare Other | Admitting: Registered Nurse

## 2020-12-01 ENCOUNTER — Other Ambulatory Visit: Payer: Self-pay

## 2020-12-01 ENCOUNTER — Encounter: Payer: Medicare Other | Attending: Registered Nurse | Admitting: Registered Nurse

## 2020-12-01 ENCOUNTER — Encounter: Payer: Self-pay | Admitting: Registered Nurse

## 2020-12-01 VITALS — BP 150/98 | HR 100 | Temp 98.8°F | Ht 66.5 in | Wt 270.0 lb

## 2020-12-01 DIAGNOSIS — M542 Cervicalgia: Secondary | ICD-10-CM | POA: Diagnosis not present

## 2020-12-01 DIAGNOSIS — G894 Chronic pain syndrome: Secondary | ICD-10-CM | POA: Diagnosis present

## 2020-12-01 DIAGNOSIS — M7061 Trochanteric bursitis, right hip: Secondary | ICD-10-CM | POA: Diagnosis present

## 2020-12-01 DIAGNOSIS — M5416 Radiculopathy, lumbar region: Secondary | ICD-10-CM | POA: Insufficient documentation

## 2020-12-01 DIAGNOSIS — M7062 Trochanteric bursitis, left hip: Secondary | ICD-10-CM | POA: Diagnosis present

## 2020-12-01 DIAGNOSIS — Q870 Congenital malformation syndromes predominantly affecting facial appearance: Secondary | ICD-10-CM | POA: Insufficient documentation

## 2020-12-01 DIAGNOSIS — Z79891 Long term (current) use of opiate analgesic: Secondary | ICD-10-CM | POA: Insufficient documentation

## 2020-12-01 DIAGNOSIS — F5101 Primary insomnia: Secondary | ICD-10-CM | POA: Insufficient documentation

## 2020-12-01 DIAGNOSIS — M47816 Spondylosis without myelopathy or radiculopathy, lumbar region: Secondary | ICD-10-CM | POA: Insufficient documentation

## 2020-12-01 DIAGNOSIS — M5412 Radiculopathy, cervical region: Secondary | ICD-10-CM | POA: Insufficient documentation

## 2020-12-01 DIAGNOSIS — Z5181 Encounter for therapeutic drug level monitoring: Secondary | ICD-10-CM | POA: Diagnosis present

## 2020-12-01 MED ORDER — MORPHINE SULFATE ER 100 MG PO TBCR: 100.0000 mg | EXTENDED_RELEASE_TABLET | Freq: Two times a day (BID) | ORAL | 0 refills | Status: DC

## 2020-12-01 MED ORDER — MORPHINE SULFATE ER 100 MG PO TBCR
100.0000 mg | EXTENDED_RELEASE_TABLET | Freq: Two times a day (BID) | ORAL | 0 refills | Status: DC
Start: 2020-12-01 — End: 2021-01-08

## 2020-12-01 NOTE — Progress Notes (Signed)
Subjective:    Patient ID: Kenneth Farrell, male    DOB: [birth year], 50 y.o.   MRN: [id number]  HPI: Kenneth Farrell is a 50 y.o. male who returns for follow up appointment for chronic pain and medication refill. He states his  pain is located in his neck radiating into his bilateral shoulders and lower back pain radiating into his bilateral hips and bilateral lower extremities. He rates his pain 7. His current exercise regime is walking and performing stretching exercises.  Kenneth Farrell Morphine equivalent is 200.00  MME.He is also prescribed Alprazolam. We have discussed the black box warning of using opioids and benzodiazepines. I highlighted the dangers of using these drugs together and discussed the adverse events including respiratory suppression, overdose, cognitive impairment and importance of compliance with current regimen. We will continue to monitor and adjust as indicated.    Last UDS was Performed on 09/22/2020, it was consistent.    Pain Inventory Average Pain 7 Pain Right Now 7 My pain is intermittent, constant, sharp, burning, dull, stabbing, tingling, aching and nagging pain.  In the last 24 hours, has pain interfered with the following? General activity 7 Relation with others 7 Enjoyment of life 7 What TIME of day is your pain at its worst? morning , daytime, evening and night Sleep (in general) Fair  Pain is worse with: walking, bending, sitting, inactivity, standing, unsure and some activites Pain improves with: rest, heat/ice, therapy/exercise, pacing activities and medication Relief from Meds: 5  Family History  Problem Relation Age of Onset  . Hypertension Mother   . Hypertension Father    Social History   Socioeconomic History  . Marital status: Single    Spouse name: Not on file  . Number of children: Not on file  . Years of education: Not on file  . Highest education level: Not on file  Occupational History  . Not on file  Tobacco Use  . Smoking  status: Former Smoker    Quit date: 06/27/2011    Years since quitting: 9.4  . Smokeless tobacco: Never Used  Substance and Sexual Activity  . Alcohol use: Not on file  . Drug use: Not on file  . Sexual activity: Not on file  Other Topics Concern  . Not on file  Social History Narrative  . Not on file   Social Determinants of Health   Financial Resource Strain: Not on file  Food Insecurity: Not on file  Transportation Needs: Not on file  Physical Activity: Not on file  Stress: Not on file  Social Connections: Not on file   Past Surgical History:  Procedure Laterality Date  . HERNIA REPAIR    . SPINE SURGERY    . urinary tract surgery     Past Surgical History:  Procedure Laterality Date  . HERNIA REPAIR    . SPINE SURGERY    . urinary tract surgery     Past Medical History:  Diagnosis Date  . Anxiety   . Chronic pain syndrome   . 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 Facet syndrome, lumbar   . Kyphoscoliosis and scoliosis   . Lumbago    Temp 98.8 F (37.1 C)   Wt 270 lb (122.5 kg)   BMI 42.93 kg/m   Opioid Risk Score:   Fall Risk Score:  `1  Depression screen PHQ 2/9  Depression screen [facility] 2/9 05/30/2020 03/31/2020 03/17/2019 11/18/2018 07/29/2018 01/21/2018 04/10/2016  Decreased Interest 0 0 0 0  0 0 0  Down, Depressed, Hopeless 0 0 0 0 0 0 0  PHQ - 2 Score 0 0 0 0 0 0 0  Altered sleeping - - - - - - -  Tired, decreased energy - - - - - - -  Change in appetite - - - - - - -  Feeling bad or failure about yourself  - - - - - - -  Trouble concentrating - - - - - - -  Moving slowly or fidgety/restless - - - - - - -  Suicidal thoughts - - - - - - -  PHQ-9 Score - - - - - - -   Review of Systems  Musculoskeletal: Positive for back pain, gait problem and neck pain.       Pain from neck down to knees  All other systems reviewed and are negative.      Objective:   Physical Exam Vitals and nursing note reviewed.  Constitutional:      Appearance:  Normal appearance. He is obese.  Neck:     Comments: Cervical Paraspinal Tenderness: C-5-C-6 Cardiovascular:     Rate and Rhythm: Normal rate and regular rhythm.     Pulses: Normal pulses.     Heart sounds: Normal heart sounds.  Pulmonary:     Effort: Pulmonary effort is normal.     Breath sounds: Normal breath sounds.     Comments: Continuous Oxygen 3 Liters Nasal Cannula Musculoskeletal:     Cervical back: Normal range of motion and neck supple.     Comments: Normal Muscle Bulk and Muscle Testing Reveals:  Upper Extremities: Full ROM and Muscle Strength 5/5 Thoracic and Lumbar Hypersensitivity Lower Extremities: Full ROM and Muscle Strength 5/5 Arises from Table Slowly Narrow Based Gait   Skin:    General: Skin is warm and dry.  Neurological:     Mental Status: He is alert and oriented to person, place, and time.  Psychiatric:        Mood and Affect: Mood normal.        Behavior: Behavior normal.           Assessment & Plan:  1. 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 Lumbar Radiculitis/ Chronic Back Pain/ Lumbar Spondylosis: MS Contin is effective for pain management and makes a great difference in his quality of Life. Continue using Pamelor for neuropathy. Also recommended alternating heat and ice for shoulder pain. 12/01/2020. Refilled:MS Contin 100mg  #60,---use one tablet every 12 hours.Second script sent for the following month.12/01/2020 We will continue the opioid monitoring program, this consists of regular clinic visits, examinations, urine drug screen, pill counts as well as use of 12/03/2020 Controlled Substance Reporting system. A 12 month History has been reviewed on the [place] Controlled Substance Reporting Systemon02/25/2022. 3. Anxiety: Continue Xanax.Continue to Monitor.12/01/2020 4. Insomnia: Continue Ambien.Continue to Monitor.12/01/2020. 5. Muscle Spasm: Continue Flexeril.Continue to  monitor.12/01/2020. 6.Cervicalgia:Cervical RadiculitisContinue HEP as tolerated. Continue to monitor.12/01/2020 7. Bilateral Greater Trochanter Bursitis: Continue to Alternate Ice and Heat Therapy. Continue current medication. Continue to Monitor.   F/U in75months

## 2021-01-08 ENCOUNTER — Telehealth: Payer: Self-pay | Admitting: Physical Medicine & Rehabilitation

## 2021-01-08 DIAGNOSIS — Q870 Congenital malformation syndromes predominantly affecting facial appearance: Secondary | ICD-10-CM

## 2021-01-08 DIAGNOSIS — G894 Chronic pain syndrome: Secondary | ICD-10-CM

## 2021-01-08 DIAGNOSIS — M47816 Spondylosis without myelopathy or radiculopathy, lumbar region: Secondary | ICD-10-CM

## 2021-01-08 MED ORDER — MORPHINE SULFATE ER 100 MG PO TBCR: 100.0000 mg | EXTENDED_RELEASE_TABLET | Freq: Two times a day (BID) | ORAL | 0 refills | Status: DC

## 2021-01-08 NOTE — Telephone Encounter (Signed)
Patient was trying to get MS Contin filled at his regular pharmacy [facility] @ [phone]) doesn't have enough to fill his prescription.  He would like for Riley Lam to send it the [facility] @ [address] in [place]) [phone] is the phone number.  Please call patient when this is complete.

## 2021-01-08 NOTE — Telephone Encounter (Signed)
PMP was reviewed. Sybil cancelled the Morphine prescription  at [facility] in [place]. New prescription sent to [facility] on [facility]. This provider placed a call to Mr. Beavers regarding the above, he verbalizes understanding.

## 2021-01-21 ENCOUNTER — Other Ambulatory Visit: Payer: Self-pay | Admitting: Registered Nurse

## 2021-01-26 ENCOUNTER — Encounter: Payer: Medicare Other | Admitting: Registered Nurse

## 2021-01-28 ENCOUNTER — Other Ambulatory Visit: Payer: Self-pay | Admitting: Registered Nurse

## 2021-01-28 DIAGNOSIS — Q870 Congenital malformation syndromes predominantly affecting facial appearance: Secondary | ICD-10-CM

## 2021-01-29 NOTE — Telephone Encounter (Signed)
PMP was Reviewed. Ambien  e-scribed today. Placed a call to Mr. Lupe regarding the above, no answer.

## 2021-02-02 ENCOUNTER — Other Ambulatory Visit: Payer: Self-pay

## 2021-02-02 ENCOUNTER — Encounter: Payer: Medicare Other | Attending: Registered Nurse | Admitting: Registered Nurse

## 2021-02-02 ENCOUNTER — Encounter: Payer: Self-pay | Admitting: Registered Nurse

## 2021-02-02 VITALS — BP 167/99 | HR 97 | Temp 98.4°F | Ht 66.5 in | Wt 257.8 lb

## 2021-02-02 DIAGNOSIS — G894 Chronic pain syndrome: Secondary | ICD-10-CM

## 2021-02-02 DIAGNOSIS — Q870 Congenital malformation syndromes predominantly affecting facial appearance: Secondary | ICD-10-CM

## 2021-02-02 DIAGNOSIS — Z79891 Long term (current) use of opiate analgesic: Secondary | ICD-10-CM

## 2021-02-02 DIAGNOSIS — M47816 Spondylosis without myelopathy or radiculopathy, lumbar region: Secondary | ICD-10-CM

## 2021-02-02 DIAGNOSIS — M5416 Radiculopathy, lumbar region: Secondary | ICD-10-CM

## 2021-02-02 DIAGNOSIS — M7062 Trochanteric bursitis, left hip: Secondary | ICD-10-CM | POA: Diagnosis present

## 2021-02-02 DIAGNOSIS — M542 Cervicalgia: Secondary | ICD-10-CM

## 2021-02-02 DIAGNOSIS — Z5181 Encounter for therapeutic drug level monitoring: Secondary | ICD-10-CM | POA: Diagnosis present

## 2021-02-02 DIAGNOSIS — M5412 Radiculopathy, cervical region: Secondary | ICD-10-CM | POA: Diagnosis present

## 2021-02-02 DIAGNOSIS — F5101 Primary insomnia: Secondary | ICD-10-CM | POA: Diagnosis present

## 2021-02-02 MED ORDER — MORPHINE SULFATE ER 100 MG PO TBCR: 100.0000 mg | EXTENDED_RELEASE_TABLET | Freq: Two times a day (BID) | ORAL | 0 refills | Status: DC

## 2021-02-02 NOTE — Progress Notes (Signed)
Subjective:    Patient ID: Kenneth Farrell, male    DOB: [birth year], 50 y.o.   MRN: [id number]  HPI: Kenneth Farrell is a 50 y.o. male who returns for follow up appointment for chronic pain and medication refill. He states his pain is located in his neck radiating into his bilateral shoulders, mid- lower back pain radiating into his bilateral hips and bilateral lower extremities. He rates his pain 7. His  current exercise regime is walking and performing stretching exercises.  [name] Morphine equivalent is 200.00 MME.  UDS ordered today.    Pain Inventory Average Pain 6 Pain Right Now 7 My pain is intermittent, constant, sharp, burning, dull, stabbing, tingling and aching  In the last 24 hours, has pain interfered with the following? General activity 7 Relation with others 7 Enjoyment of life 7 What TIME of day is your pain at its worst? morning , daytime, evening and night Sleep (in general) Fair  Pain is worse with: walking, bending, sitting, inactivity, standing, unsure and some activites Pain improves with: rest, heat/ice, therapy/exercise, pacing activities and medication Relief from Meds: 5  Family History  Problem Relation Age of Onset  . Hypertension Mother   . Hypertension Father    Social History   Socioeconomic History  . Marital status: Single    Spouse name: Not on file  . Number of children: Not on file  . Years of education: Not on file  . Highest education level: Not on file  Occupational History  . Not on file  Tobacco Use  . Smoking status: Former Smoker    Quit date: 06/27/2011    Years since quitting: 9.6  . Smokeless tobacco: Never Used  Vaping Use  . Vaping Use: Never used  Substance and Sexual Activity  . Alcohol use: Not Currently  . Drug use: Not Currently  . Sexual activity: Not on file  Other Topics Concern  . Not on file  Social History Narrative  . Not on file   Social Determinants of Health   Financial Resource Strain: Not on  file  Food Insecurity: Not on file  Transportation Needs: Not on file  Physical Activity: Not on file  Stress: Not on file  Social Connections: Not on file   Past Surgical History:  Procedure Laterality Date  . HERNIA REPAIR    . SPINE SURGERY    . urinary tract surgery     Past Surgical History:  Procedure Laterality Date  . HERNIA REPAIR    . SPINE SURGERY    . urinary tract surgery     Past Medical History:  Diagnosis Date  . Anxiety   . Chronic pain syndrome   . 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 Facet syndrome, lumbar   . Kyphoscoliosis and scoliosis   . Lumbago    BP (!) 167/99   Pulse 97   Temp 98.4 F (36.9 C)   Ht 5' 6.5" (1.689 m)   Wt 257 lb 12.8 oz (116.9 kg)   SpO2 98%   BMI 40.99 kg/m   Opioid Risk Score:   Fall Risk Score:  `1  Depression screen PHQ 2/9  Depression screen [facility] 2/9 12/01/2020 05/30/2020 03/31/2020 03/17/2019 11/18/2018 07/29/2018 01/21/2018  Decreased Interest 0 0 0 0 0 0 0  Down, Depressed, Hopeless 0 0 0 0 0 0 0  PHQ - 2 Score 0 0 0 0 0 0 0  Altered sleeping - - - - - - -  Tired, decreased energy - - - - - - -  Change in appetite - - - - - - -  Feeling bad or failure about yourself  - - - - - - -  Trouble concentrating - - - - - - -  Moving slowly or fidgety/restless - - - - - - -  Suicidal thoughts - - - - - - -  PHQ-9 Score - - - - - - -    Review of Systems  Musculoskeletal: Positive for back pain, gait problem and neck pain.       Pain from neck down to knees  All other systems reviewed and are negative.      Objective:   Physical Exam Vitals and nursing note reviewed.  Constitutional:      Appearance: Normal appearance. He is obese.  Neck:     Comments: Cervical Paraspinal Tenderness: C-5-C-6 Cardiovascular:     Rate and Rhythm: Normal rate and regular rhythm.     Pulses: Normal pulses.     Heart sounds: Normal heart sounds.  Musculoskeletal:     Cervical back: Normal range of motion and neck  supple.     Comments: Normal Muscle Bulk and Muscle Testing Reveals:  Upper Extremities: Full ROM and Muscle Strength 5/5 Bilateral AC Joint Tendenress  Thoracic Paraspinal Tenderness: T-7-T-9 Lumbar Paraspinal Tenderness: L-3-L-5 Lower Extremities: Full ROM and Muscle Strength 5/5 Arises from Table with ease Narrow Based  Gait   Skin:    General: Skin is warm and dry.  Neurological:     Mental Status: He is alert and oriented to person, place, and time.  Psychiatric:        Mood and Affect: Mood normal.        Behavior: Behavior normal.           Assessment & Plan:  1. 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 Lumbar Radiculitis/ Chronic Back Pain/ Lumbar Spondylosis: MS Contin is effective for pain management and makes a great difference in his quality of Life. Continue using Pamelor for neuropathy. Also recommended alternating heat and ice for shoulder pain. 02/02/2021. Refilled:MS Contin 100mg  #60,---use one tablet every 12 hours.Second script sent for the following month.02/02/2021 We will continue the opioid monitoring program, this consists of regular clinic visits, examinations, urine drug screen, pill counts as well as use of 02/04/2021 Controlled Substance Reporting system. A 12 month History has been reviewed on the [place] Controlled Substance Reporting Systemon04/29/2022. 3. Anxiety: Continue Xanax.Continue to Monitor.02/02/2021 4. Insomnia: Continue Ambien.Continue to Monitor.02/02/2021. 5. Muscle Spasm: Continue Flexeril.Continue to monitor.02/02/2021. 6.Cervicalgia:Cervical RadiculitisContinue HEP as tolerated. Continue to monitor.02/02/2021 7. Bilateral Greater Trochanter Bursitis: Continue to Alternate Ice and Heat Therapy. Continue current medication. Continue to Monitor.02/02/2021  F/U in61months

## 2021-02-13 ENCOUNTER — Telehealth: Payer: Self-pay | Admitting: *Deleted

## 2021-02-13 LAB — TOXASSURE SELECT,+ANTIDEPR,UR

## 2021-02-13 NOTE — Telephone Encounter (Signed)
Urine drug screen for this encounter is consistent for prescribed medication 

## 2021-02-24 ENCOUNTER — Other Ambulatory Visit: Payer: Self-pay | Admitting: Registered Nurse

## 2021-03-30 ENCOUNTER — Ambulatory Visit: Payer: Medicare Other | Admitting: Registered Nurse

## 2021-04-06 ENCOUNTER — Encounter: Payer: Medicare Other | Attending: Registered Nurse | Admitting: Registered Nurse

## 2021-04-06 ENCOUNTER — Other Ambulatory Visit: Payer: Self-pay

## 2021-04-06 ENCOUNTER — Encounter: Payer: Self-pay | Admitting: Registered Nurse

## 2021-04-06 VITALS — BP 131/74 | HR 100

## 2021-04-06 DIAGNOSIS — M5412 Radiculopathy, cervical region: Secondary | ICD-10-CM | POA: Diagnosis present

## 2021-04-06 DIAGNOSIS — M542 Cervicalgia: Secondary | ICD-10-CM | POA: Diagnosis present

## 2021-04-06 DIAGNOSIS — M7061 Trochanteric bursitis, right hip: Secondary | ICD-10-CM | POA: Diagnosis present

## 2021-04-06 DIAGNOSIS — Z5181 Encounter for therapeutic drug level monitoring: Secondary | ICD-10-CM | POA: Diagnosis present

## 2021-04-06 DIAGNOSIS — M5416 Radiculopathy, lumbar region: Secondary | ICD-10-CM

## 2021-04-06 DIAGNOSIS — G894 Chronic pain syndrome: Secondary | ICD-10-CM | POA: Diagnosis present

## 2021-04-06 DIAGNOSIS — M47816 Spondylosis without myelopathy or radiculopathy, lumbar region: Secondary | ICD-10-CM

## 2021-04-06 DIAGNOSIS — M7062 Trochanteric bursitis, left hip: Secondary | ICD-10-CM | POA: Diagnosis present

## 2021-04-06 DIAGNOSIS — F5101 Primary insomnia: Secondary | ICD-10-CM | POA: Diagnosis present

## 2021-04-06 DIAGNOSIS — Q870 Congenital malformation syndromes predominantly affecting facial appearance: Secondary | ICD-10-CM

## 2021-04-06 DIAGNOSIS — Z79891 Long term (current) use of opiate analgesic: Secondary | ICD-10-CM

## 2021-04-06 MED ORDER — MORPHINE SULFATE ER 100 MG PO TBCR: 100.0000 mg | EXTENDED_RELEASE_TABLET | Freq: Two times a day (BID) | ORAL | 0 refills | Status: DC

## 2021-04-06 MED ORDER — CYCLOBENZAPRINE HCL 5 MG PO TABS: ORAL_TABLET | ORAL | 4 refills | Status: DC

## 2021-04-06 MED ORDER — ALPRAZOLAM 1 MG PO TABS: ORAL_TABLET | ORAL | 2 refills | Status: DC

## 2021-04-06 MED ORDER — ZOLPIDEM TARTRATE 10 MG PO TABS: ORAL_TABLET | ORAL | 2 refills | Status: DC
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
## 2021-05-31 ENCOUNTER — Ambulatory Visit: Payer: Medicare Other | Admitting: Registered Nurse

## 2021-06-01 ENCOUNTER — Encounter: Payer: Medicare Other | Attending: Registered Nurse | Admitting: Registered Nurse

## 2021-06-01 ENCOUNTER — Encounter: Payer: Self-pay | Admitting: Registered Nurse

## 2021-06-01 ENCOUNTER — Other Ambulatory Visit: Payer: Self-pay

## 2021-06-01 VITALS — BP 148/97 | HR 111 | Temp 98.1°F | Ht 65.6 in | Wt 252.0 lb

## 2021-06-01 DIAGNOSIS — Q870 Congenital malformation syndromes predominantly affecting facial appearance: Secondary | ICD-10-CM | POA: Diagnosis present

## 2021-06-01 DIAGNOSIS — F5101 Primary insomnia: Secondary | ICD-10-CM

## 2021-06-01 DIAGNOSIS — Z5181 Encounter for therapeutic drug level monitoring: Secondary | ICD-10-CM

## 2021-06-01 DIAGNOSIS — M7061 Trochanteric bursitis, right hip: Secondary | ICD-10-CM | POA: Diagnosis present

## 2021-06-01 DIAGNOSIS — F411 Generalized anxiety disorder: Secondary | ICD-10-CM

## 2021-06-01 DIAGNOSIS — M7062 Trochanteric bursitis, left hip: Secondary | ICD-10-CM

## 2021-06-01 DIAGNOSIS — M47816 Spondylosis without myelopathy or radiculopathy, lumbar region: Secondary | ICD-10-CM | POA: Diagnosis present

## 2021-06-01 DIAGNOSIS — M5412 Radiculopathy, cervical region: Secondary | ICD-10-CM

## 2021-06-01 DIAGNOSIS — Z79891 Long term (current) use of opiate analgesic: Secondary | ICD-10-CM

## 2021-06-01 DIAGNOSIS — M542 Cervicalgia: Secondary | ICD-10-CM | POA: Diagnosis present

## 2021-06-01 DIAGNOSIS — G894 Chronic pain syndrome: Secondary | ICD-10-CM | POA: Diagnosis present

## 2021-06-01 DIAGNOSIS — M5416 Radiculopathy, lumbar region: Secondary | ICD-10-CM | POA: Diagnosis present

## 2021-06-01 MED ORDER — MORPHINE SULFATE ER 100 MG PO TBCR: 100.0000 mg | EXTENDED_RELEASE_TABLET | Freq: Two times a day (BID) | ORAL | 0 refills | Status: DC

## 2021-06-01 MED ORDER — ALPRAZOLAM 1 MG PO TABS: ORAL_TABLET | ORAL | 2 refills | Status: DC

## 2021-06-01 NOTE — Progress Notes (Signed)
Subjective:    Patient ID: Kenneth Farrell, male    DOB: November 20, 1970, 50 y.o.   MRN: [id number]  HPI: Kenneth Farrell is a 50 y.o. male who returns for follow up appointment for chronic pain and medication refill. He states his  pain is located in his neck radiating into his bilateral shoulders, lower back pain radiating into his bilateral hips and bilateral lower extremities. Also reports bilateral knee pain.He rates his pain 7. His current exercise regime is walking and performing stretching exercises.  [name] Morphine equivalent is 200.00 MME. He is also prescribed alprazolam. We have discussed the black box warning of using opioids and benzodiazepines. I highlighted the dangers of using these drugs together and discussed the adverse events including respiratory suppression, overdose, cognitive impairment and importance of compliance with current regimen. We will continue to monitor and adjust as indicated.  .    Last UDS was Performed on 02/02/2021, it was consistent.      Pain Inventory Average Pain 7 Pain Right Now 7 My pain is constant, sharp, burning, dull, stabbing, tingling, and aching  In the last 24 hours, has pain interfered with the following? General activity 7 Relation with others 7 Enjoyment of life 7 What TIME of day is your pain at its worst? morning , daytime, evening, and night Sleep (in general) Fair  Pain is worse with: walking, bending, sitting, inactivity, standing, and some activites Pain improves with: rest, heat/ice, therapy/exercise, pacing activities, and medication Relief from Meds: 5  Family History  Problem Relation Age of Onset   Hypertension Mother    Hypertension Father    Social History   Socioeconomic History   Marital status: Single    Spouse name: Not on file   Number of children: Not on file   Years of education: Not on file   Highest education level: Not on file  Occupational History   Not on file  Tobacco Use   Smoking status:  Former    Types: Cigarettes    Quit date: 06/27/2011    Years since quitting: 9.9   Smokeless tobacco: Never  Vaping Use   Vaping Use: Never used  Substance and Sexual Activity   Alcohol use: Not Currently   Drug use: Not Currently   Sexual activity: Not on file  Other Topics Concern   Not on file  Social History Narrative   Not on file   Social Determinants of Health   Financial Resource Strain: Not on file  Food Insecurity: Not on file  Transportation Needs: Not on file  Physical Activity: Not on file  Stress: Not on file  Social Connections: Not on file   Past Surgical History:  Procedure Laterality Date   HERNIA REPAIR     SPINE SURGERY     urinary tract surgery     Past Surgical History:  Procedure Laterality Date   HERNIA REPAIR     SPINE SURGERY     urinary tract surgery     Past Medical History:  Diagnosis Date   Anxiety    Chronic pain syndrome    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    Facet syndrome, lumbar    Kyphoscoliosis and scoliosis    Lumbago    BP (!) 148/97   Pulse (!) 111   Temp 98.1 F (36.7 C)   Ht 5' 5.6" (1.666 m)   Wt 252 lb (114.3 kg)   SpO2 98%   BMI 41.17 kg/m  Opioid Risk Score:   Fall Risk Score:  `1  Depression screen PHQ 2/9  Depression screen [facility] 2/9 06/01/2021 04/06/2021 12/01/2020 05/30/2020 03/31/2020 03/17/2019 11/18/2018  Decreased Interest 0 0 0 0 0 0 0  Down, Depressed, Hopeless 0 0 0 0 0 0 0  PHQ - 2 Score 0 0 0 0 0 0 0  Altered sleeping - - - - - - -  Tired, decreased energy - - - - - - -  Change in appetite - - - - - - -  Feeling bad or failure about yourself  - - - - - - -  Trouble concentrating - - - - - - -  Moving slowly or fidgety/restless - - - - - - -  Suicidal thoughts - - - - - - -  PHQ-9 Score - - - - - - -    Review of Systems  Constitutional: Negative.   HENT: Negative.    Eyes: Negative.   Respiratory:         Oxygen  Cardiovascular: Negative.   Gastrointestinal: Negative.    Endocrine: Negative.   Genitourinary: Negative.   Musculoskeletal:  Positive for back pain.  Skin: Negative.   Allergic/Immunologic: Negative.   Neurological: Negative.   Hematological: Negative.   Psychiatric/Behavioral: Negative.    All other systems reviewed and are negative.     Objective:   Physical Exam Vitals and nursing note reviewed.  Constitutional:      Appearance: Normal appearance.  Neck:     Comments: Cervical Paraspinal Tenderness: C-5-C-6 Cardiovascular:     Rate and Rhythm: Normal rate and regular rhythm.     Pulses: Normal pulses.     Heart sounds: Normal heart sounds.  Pulmonary:     Effort: Pulmonary effort is normal.     Breath sounds: Normal breath sounds.     Comments: Continuous Oxygen @3  liters nasal cannula Musculoskeletal:     Cervical back: Normal range of motion and neck supple.     Comments: Normal Muscle Bulk and Muscle Testing Reveals:  Upper Extremities: Full ROM and Muscle Strength  5/5 Bilateral AC Joint Tenderness Lumbar Paraspinal Tenderness: L-3-L-5 Bilateral Greater Trochanter Tenderness Lower Extremities: Full ROM and Muscle Strength 5/5 Arises from Table with ease Narrow Based Gait     Skin:    General: Skin is warm and dry.  Neurological:     Mental Status: He is alert and oriented to person, place, and time.  Psychiatric:        Mood and Affect: Mood normal.        Behavior: Behavior normal.         Assessment & Plan:  1. 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 06/01/2021 2. Lumbar Radiculitis/ Chronic Back Pain/ Lumbar Spondylosis: MS Contin is effective for pain management and makes a great difference in his quality of Life. Continue using Pamelor for neuropathy. Also recommended alternating heat and ice for shoulder pain.  06/01/2021. Refilled: MS Contin 100mg  #60,---use one tablet every 12 hours. Second script sent for the following month. 06/01/2021 We will continue the opioid monitoring  program, this consists of regular clinic visits, examinations, urine drug screen, pill counts as well as use of Controlled Substance Reporting system. A 12 month History has been reviewed on the 06/03/2021 Controlled Substance Reporting System on 06/01/2021. 3. Anxiety: Continue Xanax. Continue to Monitor.08/262022 4. Insomnia: Continue Ambien. Continue to Monitor. 06/01/2021. 5. Muscle Spasm: Continue Flexeril. Continue to monitor.  06/01/2021. 6. Cervicalgia: Cervical Radiculitis Continue HEP as tolerated. Continue to monitor. 06/01/2021 7. Bilateral Greater Trochanter Bursitis: Continue to Alternate Ice and Heat Therapy. Continue current medication. Continue to Monitor. 06/01/2021   F/U in 2 months

## 2021-06-08 ENCOUNTER — Ambulatory Visit: Payer: Medicare Other | Admitting: Registered Nurse

## 2021-06-23 ENCOUNTER — Other Ambulatory Visit: Payer: Self-pay | Admitting: Registered Nurse

## 2021-07-20 ENCOUNTER — Other Ambulatory Visit: Payer: Self-pay | Admitting: Registered Nurse

## 2021-07-23 ENCOUNTER — Telehealth: Payer: Self-pay | Admitting: Registered Nurse

## 2021-07-23 MED ORDER — ALPRAZOLAM 1 MG PO TABS: ORAL_TABLET | ORAL | 2 refills | Status: DC

## 2021-07-23 NOTE — Telephone Encounter (Signed)
PMP was Reviewed.  Alprazolam e- scribed today.  Kenneth Farrell is aware via My-Chart Message

## 2021-08-02 ENCOUNTER — Other Ambulatory Visit: Payer: Self-pay

## 2021-08-02 ENCOUNTER — Encounter: Payer: Medicare Other | Attending: Registered Nurse | Admitting: Registered Nurse

## 2021-08-02 ENCOUNTER — Encounter: Payer: Self-pay | Admitting: Registered Nurse

## 2021-08-02 VITALS — BP 146/95 | HR 114 | Ht 65.5 in | Wt 254.6 lb

## 2021-08-02 DIAGNOSIS — Q870 Congenital malformation syndromes predominantly affecting facial appearance: Secondary | ICD-10-CM | POA: Diagnosis present

## 2021-08-02 DIAGNOSIS — M7061 Trochanteric bursitis, right hip: Secondary | ICD-10-CM | POA: Diagnosis present

## 2021-08-02 DIAGNOSIS — Z79891 Long term (current) use of opiate analgesic: Secondary | ICD-10-CM | POA: Diagnosis present

## 2021-08-02 DIAGNOSIS — M7062 Trochanteric bursitis, left hip: Secondary | ICD-10-CM | POA: Diagnosis present

## 2021-08-02 DIAGNOSIS — Z5181 Encounter for therapeutic drug level monitoring: Secondary | ICD-10-CM | POA: Diagnosis present

## 2021-08-02 DIAGNOSIS — F5101 Primary insomnia: Secondary | ICD-10-CM | POA: Diagnosis present

## 2021-08-02 DIAGNOSIS — M5416 Radiculopathy, lumbar region: Secondary | ICD-10-CM

## 2021-08-02 DIAGNOSIS — G894 Chronic pain syndrome: Secondary | ICD-10-CM | POA: Diagnosis present

## 2021-08-02 DIAGNOSIS — M47816 Spondylosis without myelopathy or radiculopathy, lumbar region: Secondary | ICD-10-CM

## 2021-08-02 DIAGNOSIS — M542 Cervicalgia: Secondary | ICD-10-CM | POA: Diagnosis not present

## 2021-08-02 DIAGNOSIS — M5412 Radiculopathy, cervical region: Secondary | ICD-10-CM

## 2021-08-02 MED ORDER — MORPHINE SULFATE ER 100 MG PO TBCR: 100.0000 mg | EXTENDED_RELEASE_TABLET | Freq: Two times a day (BID) | ORAL | 0 refills | Status: DC

## 2021-08-02 MED ORDER — ZOLPIDEM TARTRATE 10 MG PO TABS: ORAL_TABLET | ORAL | 2 refills | Status: DC

## 2021-08-02 NOTE — Progress Notes (Signed)
Subjective:    Patient ID: Kenneth Farrell, male    DOB: Sep 03, 1971, 50 y.o.   MRN: [id number]  HPI: Kenneth Farrell is a 50 y.o. male who returns for follow up appointment for chronic pain and medication refill. He states his  pain is located in his neck radiating into his bilateral shoulders, lower back pain radiating into his bilateral hips and bilateral lower extremities. Also reports bilateral knee pain. He rates his pain 7. His current exercise regime is walking and performing stretching exercises.  Kenneth Farrell Morphine equivalent is 200.00 MME. He is also prescribed Alprazolam. We have discussed the black box warning of using opioids and benzodiazepines. I highlighted the dangers of using these drugs together and discussed the adverse events including respiratory suppression, overdose, cognitive impairment and importance of compliance with current regimen. We will continue to monitor and adjust as indicated.    Last UDS was Performed on 02/02/2021, it was consistent.     Pain Inventory Average Pain 8 Pain Right Now 7 My pain is sharp, burning, dull, stabbing, tingling, and aching  In the last 24 hours, has pain interfered with the following? General activity 7 Relation with others 7 Enjoyment of life 7 What TIME of day is your pain at its worst? morning , daytime, evening, and night Sleep (in general) Fair  Pain is worse with: walking, bending, sitting, inactivity, standing, and some activites Pain improves with: rest, heat/ice, therapy/exercise, pacing activities, medication, TENS, and injections Relief from Meds: 5  Family History  Problem Relation Age of Onset   Hypertension Mother    Hypertension Father    Social History   Socioeconomic History   Marital status: Single    Spouse name: Not on file   Number of children: Not on file   Years of education: Not on file   Highest education level: Not on file  Occupational History   Not on file  Tobacco Use   Smoking  status: Former    Types: Cigarettes    Quit date: 06/27/2011    Years since quitting: 10.1   Smokeless tobacco: Never  Vaping Use   Vaping Use: Never used  Substance and Sexual Activity   Alcohol use: Not Currently   Drug use: Not Currently   Sexual activity: Not on file  Other Topics Concern   Not on file  Social History Narrative   Not on file   Social Determinants of Health   Financial Resource Strain: Not on file  Food Insecurity: Not on file  Transportation Needs: Not on file  Physical Activity: Not on file  Stress: Not on file  Social Connections: Not on file   Past Surgical History:  Procedure Laterality Date   HERNIA REPAIR     SPINE SURGERY     urinary tract surgery     Past Surgical History:  Procedure Laterality Date   HERNIA REPAIR     SPINE SURGERY     urinary tract surgery     Past Medical History:  Diagnosis Date   Anxiety    Chronic pain syndrome    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    Facet syndrome, lumbar    Kyphoscoliosis and scoliosis    Lumbago    BP (!) 146/95   Pulse (!) 114   Ht 5' 5.5" (1.664 m)   Wt 254 lb 9.6 oz (115.5 kg)   SpO2 98%   BMI 41.72 kg/m   Opioid Risk Score:   Fall  Risk Score:  `1  Depression screen PHQ 2/9  Depression screen [facility] 2/9 06/01/2021 04/06/2021 12/01/2020 05/30/2020 03/31/2020 03/17/2019 11/18/2018  Decreased Interest 0 0 0 0 0 0 0  Down, Depressed, Hopeless 0 0 0 0 0 0 0  PHQ - 2 Score 0 0 0 0 0 0 0  Altered sleeping - - - - - - -  Tired, decreased energy - - - - - - -  Change in appetite - - - - - - -  Feeling bad or failure about yourself  - - - - - - -  Trouble concentrating - - - - - - -  Moving slowly or fidgety/restless - - - - - - -  Suicidal thoughts - - - - - - -  PHQ-9 Score - - - - - - -     Review of Systems  Musculoskeletal:  Positive for back pain.  All other systems reviewed and are negative.     Objective:   Physical Exam Vitals and nursing note reviewed.   Constitutional:      Appearance: Normal appearance.  Neck:     Comments: Cervical Paraspinal Tenderness: C-5-C-6 Cardiovascular:     Rate and Rhythm: Normal rate and regular rhythm.     Pulses: Normal pulses.     Heart sounds: Normal heart sounds.  Pulmonary:     Effort: Pulmonary effort is normal.     Breath sounds: Normal breath sounds.  Musculoskeletal:     Cervical back: Normal range of motion and neck supple.     Comments: Normal Muscle Bulk and Muscle Testing Reveals:  Upper Extremities: Full ROM and Muscle Strength  5/5 Thoracic and Lumbar Hypersensitivity Bilateral Greater Trochanter Tenderness Lower Extremities : Full ROM and Muscle Strength 5/5 Arises from Table Slowly Narrow Based Gait     Skin:    General: Skin is warm and dry.  Neurological:     Mental Status: He is alert and oriented to person, place, and time.  Psychiatric:        Mood and Affect: Mood normal.        Behavior: Behavior normal.         Assessment & Plan:  1. 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 08/02/2021 2. Lumbar Radiculitis/ Chronic Back Pain/ Lumbar Spondylosis: MS Contin is effective for pain management and makes a great difference in his quality of Life. Continue using Pamelor for neuropathy. Also recommended alternating heat and ice for shoulder pain.  08/02/2021. Refilled: MS Contin 100mg  #60,---use one tablet every 12 hours. Second script sent for the following month. 08/02/2021 We will continue the opioid monitoring program, this consists of regular clinic visits, examinations, urine drug screen, pill counts as well as use of 08/04/2021 Controlled Substance Reporting system. A 12 month History has been reviewed on the [place] Controlled Substance Reporting System on 08/02/2021. 3. Anxiety: Continue Xanax. Continue to Monitor.10/272022 4. Insomnia: Continue Ambien. Continue to Monitor. 08/02/2021. 5. Muscle Spasm: Continue Flexeril. Continue to  monitor. 08/02/2021. 6. Cervicalgia: Cervical Radiculitis Continue HEP as tolerated. Continue to monitor. 08/02/2021 7. Bilateral Greater Trochanter Bursitis: Continue to Alternate Ice and Heat Therapy. Continue current medication. Continue to Monitor. 08/02/2021   F/U in 2 months

## 2021-09-26 ENCOUNTER — Encounter: Payer: Medicare Other | Attending: Registered Nurse | Admitting: Registered Nurse

## 2021-09-26 ENCOUNTER — Encounter: Payer: Self-pay | Admitting: Registered Nurse

## 2021-09-26 ENCOUNTER — Other Ambulatory Visit: Payer: Self-pay

## 2021-09-26 VITALS — BP 161/97 | HR 95 | Temp 98.5°F | Ht 65.5 in | Wt 263.0 lb

## 2021-09-26 DIAGNOSIS — Q870 Congenital malformation syndromes predominantly affecting facial appearance: Secondary | ICD-10-CM

## 2021-09-26 DIAGNOSIS — Z5181 Encounter for therapeutic drug level monitoring: Secondary | ICD-10-CM | POA: Diagnosis not present

## 2021-09-26 DIAGNOSIS — M5412 Radiculopathy, cervical region: Secondary | ICD-10-CM | POA: Diagnosis present

## 2021-09-26 DIAGNOSIS — Z79891 Long term (current) use of opiate analgesic: Secondary | ICD-10-CM | POA: Diagnosis not present

## 2021-09-26 DIAGNOSIS — M7062 Trochanteric bursitis, left hip: Secondary | ICD-10-CM

## 2021-09-26 DIAGNOSIS — M7061 Trochanteric bursitis, right hip: Secondary | ICD-10-CM | POA: Insufficient documentation

## 2021-09-26 DIAGNOSIS — M542 Cervicalgia: Secondary | ICD-10-CM

## 2021-09-26 DIAGNOSIS — M5416 Radiculopathy, lumbar region: Secondary | ICD-10-CM | POA: Diagnosis present

## 2021-09-26 DIAGNOSIS — G894 Chronic pain syndrome: Secondary | ICD-10-CM

## 2021-09-26 DIAGNOSIS — F5101 Primary insomnia: Secondary | ICD-10-CM

## 2021-09-26 DIAGNOSIS — M47816 Spondylosis without myelopathy or radiculopathy, lumbar region: Secondary | ICD-10-CM

## 2021-09-26 MED ORDER — MORPHINE SULFATE ER 100 MG PO TBCR: 100.0000 mg | EXTENDED_RELEASE_TABLET | Freq: Two times a day (BID) | ORAL | 0 refills | Status: DC

## 2021-09-26 MED ORDER — ZOLPIDEM TARTRATE 10 MG PO TABS: ORAL_TABLET | ORAL | 2 refills | Status: DC

## 2021-09-26 NOTE — Progress Notes (Signed)
Subjective:    Patient ID: Kenneth Farrell, male    DOB: 1971-06-26, 50 y.o.   MRN: [id number]  HPI: Kenneth Farrell is a 50 y.o. male who returns for follow up appointment for chronic pain and medication refill. He states his pain is located in his neck radiating into his bilateral shoulders, lower back pain radiating into his bilateral hips and bilateral lower extremities. He. rates his pain 7.His  current exercise regime is walking and performing stretching exercises.  [name] Morphine equivalent is 200.00 MME.   Oral Swab was Ordered today.     Pain Inventory Average Pain 7 Pain Right Now 7 My pain is intermittent, constant, sharp, burning, dull, stabbing, tingling, and aching  In the last 24 hours, has pain interfered with the following? General activity 7 Relation with others 7 Enjoyment of life 7 What TIME of day is your pain at its worst? morning , daytime, evening, and night Sleep (in general) Fair  Pain is worse with: walking, bending, sitting, inactivity, standing, unsure, and some activites Pain improves with: rest, heat/ice, therapy/exercise, pacing activities, and medication Relief from Meds: 4  Family History  Problem Relation Age of Onset   Hypertension Mother    Hypertension Father    Social History   Socioeconomic History   Marital status: Single    Spouse name: Not on file   Number of children: Not on file   Years of education: Not on file   Highest education level: Not on file  Occupational History   Not on file  Tobacco Use   Smoking status: Former    Types: Cigarettes    Quit date: 06/27/2011    Years since quitting: 10.2   Smokeless tobacco: Never  Vaping Use   Vaping Use: Never used  Substance and Sexual Activity   Alcohol use: Not Currently   Drug use: Not Currently   Sexual activity: Not on file  Other Topics Concern   Not on file  Social History Narrative   Not on file   Social Determinants of Health   Financial Resource Strain:  Not on file  Food Insecurity: Not on file  Transportation Needs: Not on file  Physical Activity: Not on file  Stress: Not on file  Social Connections: Not on file   Past Surgical History:  Procedure Laterality Date   HERNIA REPAIR     SPINE SURGERY     urinary tract surgery     Past Surgical History:  Procedure Laterality Date   HERNIA REPAIR     SPINE SURGERY     urinary tract surgery     Past Medical History:  Diagnosis Date   Anxiety    Chronic pain syndrome    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    Facet syndrome, lumbar    Kyphoscoliosis and scoliosis    Lumbago    BP (!) 161/97    Pulse 95    Temp 98.5 F (36.9 C)    Ht 5' 5.5" (1.664 m)    Wt 263 lb (119.3 kg)    SpO2 99%    BMI 43.10 kg/m   Opioid Risk Score:   Fall Risk Score:  `1  Depression screen PHQ 2/9  Depression screen [facility] 2/9 06/01/2021 04/06/2021 12/01/2020 05/30/2020 03/31/2020 03/17/2019 11/18/2018  Decreased Interest 0 0 0 0 0 0 0  Down, Depressed, Hopeless 0 0 0 0 0 0 0  PHQ - 2 Score 0 0 0 0 0 0  0  Altered sleeping - - - - - - -  Tired, decreased energy - - - - - - -  Change in appetite - - - - - - -  Feeling bad or failure about yourself  - - - - - - -  Trouble concentrating - - - - - - -  Moving slowly or fidgety/restless - - - - - - -  Suicidal thoughts - - - - - - -  PHQ-9 Score - - - - - - -     Review of Systems  Constitutional: Negative.   HENT: Negative.    Eyes: Negative.   Respiratory: Negative.    Cardiovascular: Negative.   Gastrointestinal: Negative.   Endocrine: Negative.   Genitourinary: Negative.   Musculoskeletal:  Positive for back pain and neck pain.  Skin: Negative.   Allergic/Immunologic: Negative.   Neurological: Negative.   Hematological: Negative.   Psychiatric/Behavioral: Negative.        Objective:   Physical Exam Vitals and nursing note reviewed.  Constitutional:      Appearance: Normal appearance.  Neck:     Comments: Cervical Paraspinal  Tenderness: C-5-C-6 Cardiovascular:     Rate and Rhythm: Normal rate and regular rhythm.     Pulses: Normal pulses.     Heart sounds: Normal heart sounds.  Pulmonary:     Effort: Pulmonary effort is normal.     Breath sounds: Normal breath sounds.     Comments: Continue Oxygen @ 3 Liters Nasal Cannula Musculoskeletal:     Cervical back: Normal range of motion and neck supple.     Comments: Normal Muscle Bulk and Muscle Testing Reveals:  Upper Extremities: Full ROM and Muscle Strength 5/5 Bilateral AC Joint Tenderness Lumbar Paraspinal Tenderness: L-3-L-5 Bilateral Greater Trochanter Tenderness Lower Extremities: Full ROM and Muscle Strength 5/5 Arises from Table Slowly Narrow Base  Gait     Skin:    General: Skin is warm and dry.  Neurological:     Mental Status: He is alert and oriented to person, place, and time.  Psychiatric:        Mood and Affect: Mood normal.        Behavior: Behavior normal.         Assessment & Plan:  1. 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 09/26/2021 2. Lumbar Radiculitis/ Chronic Back Pain/ Lumbar Spondylosis: MS Contin is effective for pain management and makes a great difference in his quality of Life. Continue using Pamelor for neuropathy. Also recommended alternating heat and ice for shoulder pain.  09/26/2021. Refilled: MS Contin 100mg  #60,---use one tablet every 12 hours. Second script sent for the following month. 09/26/2021 We will continue the opioid monitoring program, this consists of regular clinic visits, examinations, urine drug screen, pill counts as well as use of 09/28/2021 Controlled Substance Reporting system. A 12 month History has been reviewed on the [place] Controlled Substance Reporting System on 09/26/2021. 3. Anxiety: Continue Xanax. Continue to Monitor.12/212022 4. Insomnia: Continue Ambien. Continue to Monitor. 09/26/2021. 5. Muscle Spasm: Continue Flexeril. Continue to monitor.  09/26/2021. 6. Cervicalgia: Cervical Radiculitis Continue HEP as tolerated. Continue to monitor. 09/26/2021 7. Bilateral Greater Trochanter Bursitis: Continue to Alternate Ice and Heat Therapy. Continue current medication. Continue to Monitor. 09/26/2021   F/U in 2 months

## 2021-09-27 ENCOUNTER — Ambulatory Visit: Payer: Medicare Other | Admitting: Registered Nurse

## 2021-10-05 LAB — TOXASSURE SELECT,+ANTIDEPR,UR

## 2021-10-09 ENCOUNTER — Telehealth: Payer: Self-pay | Admitting: *Deleted

## 2021-10-09 NOTE — Telephone Encounter (Signed)
Urine drug screen for this encounter is consistent for prescribed medication. It is also positive for alcohol. He has not had a positive since 2016. A formal warning letter will be sent through MyChart.

## 2021-10-16 ENCOUNTER — Other Ambulatory Visit: Payer: Self-pay | Admitting: Registered Nurse

## 2021-10-18 ENCOUNTER — Telehealth: Payer: Self-pay

## 2021-10-18 MED ORDER — ALPRAZOLAM 1 MG PO TABS: ORAL_TABLET | ORAL | 2 refills | Status: DC

## 2021-10-18 NOTE — Telephone Encounter (Signed)
Error

## 2021-10-18 NOTE — Telephone Encounter (Signed)
PMP was Reviewed. Alprazolam e-scribed today.  Placed a call to Mr. Karapetian, he verbalizes Understanding.

## 2021-10-18 NOTE — Telephone Encounter (Signed)
Patient called and stated [facility] does not have his Alprazolam. Called [facility] and they stated they did not have a prescription for it and when they sent it to [place], it was responded with "Will respond by other means". It looks like it was sent yesterday but it has Print on the bottom. The pharmacist stated it is better if the provider escibes it because it can be kept track of.

## 2021-11-29 ENCOUNTER — Encounter: Payer: Medicare Other | Admitting: Registered Nurse

## 2021-11-30 ENCOUNTER — Encounter: Payer: Medicare Other | Attending: Registered Nurse | Admitting: Registered Nurse

## 2021-11-30 ENCOUNTER — Other Ambulatory Visit: Payer: Self-pay

## 2021-11-30 ENCOUNTER — Encounter: Payer: Self-pay | Admitting: Registered Nurse

## 2021-11-30 VITALS — BP 138/95 | HR 108 | Temp 99.1°F | Ht 65.5 in | Wt 264.0 lb

## 2021-11-30 DIAGNOSIS — M5416 Radiculopathy, lumbar region: Secondary | ICD-10-CM | POA: Diagnosis present

## 2021-11-30 DIAGNOSIS — M546 Pain in thoracic spine: Secondary | ICD-10-CM | POA: Diagnosis present

## 2021-11-30 DIAGNOSIS — M7061 Trochanteric bursitis, right hip: Secondary | ICD-10-CM | POA: Diagnosis present

## 2021-11-30 DIAGNOSIS — M542 Cervicalgia: Secondary | ICD-10-CM | POA: Diagnosis not present

## 2021-11-30 DIAGNOSIS — G894 Chronic pain syndrome: Secondary | ICD-10-CM | POA: Diagnosis not present

## 2021-11-30 DIAGNOSIS — Q870 Congenital malformation syndromes predominantly affecting facial appearance: Secondary | ICD-10-CM | POA: Diagnosis not present

## 2021-11-30 DIAGNOSIS — G8929 Other chronic pain: Secondary | ICD-10-CM | POA: Diagnosis present

## 2021-11-30 DIAGNOSIS — F5101 Primary insomnia: Secondary | ICD-10-CM | POA: Diagnosis present

## 2021-11-30 DIAGNOSIS — M47816 Spondylosis without myelopathy or radiculopathy, lumbar region: Secondary | ICD-10-CM | POA: Insufficient documentation

## 2021-11-30 DIAGNOSIS — M5412 Radiculopathy, cervical region: Secondary | ICD-10-CM | POA: Diagnosis present

## 2021-11-30 DIAGNOSIS — M62838 Other muscle spasm: Secondary | ICD-10-CM | POA: Insufficient documentation

## 2021-11-30 DIAGNOSIS — M7062 Trochanteric bursitis, left hip: Secondary | ICD-10-CM | POA: Diagnosis present

## 2021-11-30 MED ORDER — MORPHINE SULFATE ER 100 MG PO TBCR: 100.0000 mg | EXTENDED_RELEASE_TABLET | Freq: Two times a day (BID) | ORAL | 0 refills | Status: DC

## 2021-11-30 MED ORDER — ALPRAZOLAM 1 MG PO TABS: ORAL_TABLET | ORAL | 2 refills | Status: DC

## 2021-11-30 NOTE — Progress Notes (Signed)
Subjective:    Patient ID: Kenneth Farrell, male    DOB: 04-20-1971, 51 y.o.   MRN: [id number]  HPI: Kenneth Farrell is a 51 y.o. male who returns for follow up appointment for chronic pain and medication refill. He states his pain is located in his neck radiating into his bilateral shoulders, mid- lower back pain radiating into his bilateral hips  and bilateral lower extremities. Also reports bilateral knee pain. He rates his pain 7. His current exercise regime is walking and performing stretching exercises.  Kenneth Farrell Morphine equivalent is 200.00 MME. He is also prescribed Alprazolam .We have discussed the black box warning of using opioids and benzodiazepines. I highlighted the dangers of using these drugs together and discussed the adverse events including respiratory suppression, overdose, cognitive impairment and importance of compliance with current regimen. We will continue to monitor and adjust as indicated.    Last UDS was Performed on 09/26/2022, see note for detail.     Pain Inventory Average Pain 8 Pain Right Now 7 My pain is intermittent, constant, sharp, burning, dull, stabbing, tingling, and aching  In the last 24 hours, has pain interfered with the following? General activity 7 Relation with others 7 Enjoyment of life 7 What TIME of day is your pain at its worst? morning , daytime, evening, and night Sleep (in general) Fair  Pain is worse with: walking, bending, sitting, inactivity, standing, unsure, and some activites Pain improves with: rest, heat/ice, therapy/exercise, pacing activities, and medication Relief from Meds: 3  Family History  Problem Relation Age of Onset   Hypertension Mother    Hypertension Father    Social History   Socioeconomic History   Marital status: Single    Spouse name: Not on file   Number of children: Not on file   Years of education: Not on file   Highest education level: Not on file  Occupational History   Not on file   Tobacco Use   Smoking status: Former    Types: Cigarettes    Quit date: 06/27/2011    Years since quitting: 10.4   Smokeless tobacco: Never  Vaping Use   Vaping Use: Never used  Substance and Sexual Activity   Alcohol use: Not Currently   Drug use: Not Currently   Sexual activity: Not on file  Other Topics Concern   Not on file  Social History Narrative   Not on file   Social Determinants of Health   Financial Resource Strain: Not on file  Food Insecurity: Not on file  Transportation Needs: Not on file  Physical Activity: Not on file  Stress: Not on file  Social Connections: Not on file   Past Surgical History:  Procedure Laterality Date   HERNIA REPAIR     SPINE SURGERY     urinary tract surgery     Past Surgical History:  Procedure Laterality Date   HERNIA REPAIR     SPINE SURGERY     urinary tract surgery     Past Medical History:  Diagnosis Date   Anxiety    Chronic pain syndrome    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    Facet syndrome, lumbar    Kyphoscoliosis and scoliosis    Lumbago    BP (!) 138/95    Pulse (!) 113    Temp 99.1 F (37.3 C)    Ht 5' 5.5" (1.664 m)    Wt 264 lb (119.7 kg)    SpO2 96%  BMI 43.26 kg/m   Opioid Risk Score:   Fall Risk Score:  `1  Depression screen PHQ 2/9  Depression screen [facility] 2/9 11/30/2021 06/01/2021 04/06/2021 12/01/2020 05/30/2020 03/31/2020 03/17/2019  Decreased Interest 0 0 0 0 0 0 0  Down, Depressed, Hopeless 0 0 0 0 0 0 0  PHQ - 2 Score 0 0 0 0 0 0 0  Altered sleeping - - - - - - -  Tired, decreased energy - - - - - - -  Change in appetite - - - - - - -  Feeling bad or failure about yourself  - - - - - - -  Trouble concentrating - - - - - - -  Moving slowly or fidgety/restless - - - - - - -  Suicidal thoughts - - - - - - -  PHQ-9 Score - - - - - - -     Review of Systems  Constitutional: Negative.   HENT: Negative.    Eyes: Negative.   Respiratory: Negative.    Cardiovascular: Negative.    Gastrointestinal: Negative.   Endocrine: Negative.   Genitourinary: Negative.   Musculoskeletal:  Positive for back pain.  Skin: Negative.   Allergic/Immunologic: Negative.   Neurological: Negative.   Hematological: Negative.   Psychiatric/Behavioral: Negative.        Objective:   Physical Exam Vitals and nursing note reviewed.  Constitutional:      Appearance: Normal appearance. He is obese.  Cardiovascular:     Rate and Rhythm: Normal rate and regular rhythm.     Pulses: Normal pulses.     Heart sounds: Normal heart sounds.  Pulmonary:     Effort: Pulmonary effort is normal.     Breath sounds: Normal breath sounds.     Comments: Continuous Oxygen @ 3 Liters nasal cannula  Musculoskeletal:     Cervical back: Normal range of motion and neck supple.     Comments: Normal Muscle Bulk and Muscle Testing Reveals:  Upper Extremities: Full ROM and Muscle Strength 5/5 Lumbar Hypersensitivity Bilateral Greater Trochanter Tenderness Lower Extremities: Full ROM and Muscle Strength 5/5 Arises from Table Slowly Narrow Based  Gait     Skin:    General: Skin is warm and dry.  Neurological:     Mental Status: He is alert and oriented to person, place, and time.  Psychiatric:        Mood and Affect: Mood normal.        Behavior: Behavior normal.         Assessment & Plan:  1. 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 11/30/2021 2. Lumbar Radiculitis/ Chronic Back Pain/ Lumbar Spondylosis: MS Contin is effective for pain management and makes a great difference in his quality of Life. Continue using Pamelor for neuropathy. Also recommended alternating heat and ice for shoulder pain.  11/30/2021. Refilled: MS Contin 100mg  #60,---use one tablet every 12 hours. Second script sent for the following month. 11/30/2021 We will continue the opioid monitoring program, this consists of regular clinic visits, examinations, urine drug screen, pill counts as well as use of  12/02/2021 Controlled Substance Reporting system. A 12 month History has been reviewed on the [place] Controlled Substance Reporting System on 11/30/2021. 3. Anxiety: Continue Xanax. Continue to Monitor.02/242023 4. Insomnia: Continue Ambien. Continue to Monitor. 11/30/2021. 5. Muscle Spasm: Continue Flexeril. Continue to monitor. 11/30/2021. 6. Cervicalgia: Cervical Radiculitis Continue HEP as tolerated. Continue to monitor. 11/30/2021 7. Bilateral Greater Trochanter Bursitis: Continue  to Alternate Ice and Heat Therapy. Continue current medication. Continue to Monitor. 11/30/2021   F/U in 2 months

## 2022-01-24 ENCOUNTER — Encounter: Payer: Medicare Other | Admitting: Registered Nurse

## 2022-02-01 ENCOUNTER — Encounter: Payer: Self-pay | Admitting: Registered Nurse

## 2022-02-01 ENCOUNTER — Encounter: Payer: Medicare Other | Attending: Registered Nurse | Admitting: Registered Nurse

## 2022-02-01 VITALS — BP 137/88 | HR 106 | Ht 65.5 in | Wt 267.2 lb

## 2022-02-01 DIAGNOSIS — G8929 Other chronic pain: Secondary | ICD-10-CM | POA: Diagnosis present

## 2022-02-01 DIAGNOSIS — M546 Pain in thoracic spine: Secondary | ICD-10-CM | POA: Diagnosis present

## 2022-02-01 DIAGNOSIS — M7061 Trochanteric bursitis, right hip: Secondary | ICD-10-CM | POA: Insufficient documentation

## 2022-02-01 DIAGNOSIS — G894 Chronic pain syndrome: Secondary | ICD-10-CM | POA: Diagnosis present

## 2022-02-01 DIAGNOSIS — M5416 Radiculopathy, lumbar region: Secondary | ICD-10-CM | POA: Diagnosis present

## 2022-02-01 DIAGNOSIS — M62838 Other muscle spasm: Secondary | ICD-10-CM | POA: Diagnosis present

## 2022-02-01 DIAGNOSIS — M5412 Radiculopathy, cervical region: Secondary | ICD-10-CM | POA: Insufficient documentation

## 2022-02-01 DIAGNOSIS — M7062 Trochanteric bursitis, left hip: Secondary | ICD-10-CM | POA: Diagnosis present

## 2022-02-01 DIAGNOSIS — M542 Cervicalgia: Secondary | ICD-10-CM | POA: Diagnosis present

## 2022-02-01 DIAGNOSIS — F5101 Primary insomnia: Secondary | ICD-10-CM | POA: Insufficient documentation

## 2022-02-01 DIAGNOSIS — Z79891 Long term (current) use of opiate analgesic: Secondary | ICD-10-CM | POA: Diagnosis present

## 2022-02-01 DIAGNOSIS — Z5181 Encounter for therapeutic drug level monitoring: Secondary | ICD-10-CM | POA: Diagnosis present

## 2022-02-01 DIAGNOSIS — M47816 Spondylosis without myelopathy or radiculopathy, lumbar region: Secondary | ICD-10-CM | POA: Insufficient documentation

## 2022-02-01 DIAGNOSIS — Q870 Congenital malformation syndromes predominantly affecting facial appearance: Secondary | ICD-10-CM | POA: Diagnosis present

## 2022-02-01 MED ORDER — MORPHINE SULFATE ER 100 MG PO TBCR: 100.0000 mg | EXTENDED_RELEASE_TABLET | Freq: Two times a day (BID) | ORAL | 0 refills | Status: DC

## 2022-02-01 MED ORDER — ALPRAZOLAM 1 MG PO TABS: ORAL_TABLET | ORAL | 2 refills | Status: DC

## 2022-02-01 MED ORDER — CYCLOBENZAPRINE HCL 5 MG PO TABS: ORAL_TABLET | ORAL | 4 refills | Status: DC

## 2022-02-01 NOTE — Progress Notes (Signed)
? ?Subjective:  ? ? Patient ID: Kenneth Farrell, male    DOB: [birth year], 51 y.o.   MRN: [id number] ? ?HPI: Kenneth Farrell is a 51 y.o. male who returns for follow up appointment for chronic pain and medication refill. He states his  pain is located in his neck radiating into his bilateral shoulders, mid- lower back pain radiating into his bilateral hips and bilateral lower extremities. He also reports bilateral knee pain. He rates his pain 8. His current exercise regime is walking and performing stretching exercises. ? ?Ms. Herard Morphine equivalent is 200.00 MME.He  is also prescribed Alprazolam..We have discussed the black box warning of using opioids and benzodiazepines. I highlighted the dangers of using these drugs together and discussed the adverse events including respiratory suppression, overdose, cognitive impairment and importance of compliance with current regimen. We will continue to monitor and adjust as indicated.  ? ?UDS ordered Today.  ?  ?Pain Inventory ?Average Pain 7 ?Pain Right Now 8 ?My pain is intermittent, constant, sharp, burning, dull, stabbing, tingling, and aching ? ?In the last 24 hours, has pain interfered with the following? ?General activity 7 ?Relation with others 7 ?Enjoyment of life 7 ?What TIME of day is your pain at its worst? morning , daytime, evening, and night ?Sleep (in general) Fair ? ?Pain is worse with: walking, bending, sitting, inactivity, standing, and some activites ?Pain improves with: rest, heat/ice, therapy/exercise, pacing activities, and medication ?Relief from Meds: 4 ? ?Family History  ?Problem Relation Age of Onset  ? Hypertension Mother   ? Hypertension Father   ? ?Social History  ? ?Socioeconomic History  ? Marital status: Single  ?  Spouse name: Not on file  ? Number of children: Not on file  ? Years of education: Not on file  ? Highest education level: Not on file  ?Occupational History  ? Not on file  ?Tobacco Use  ? Smoking status: Former  ?  Types:  Cigarettes  ?  Quit date: 06/27/2011  ?  Years since quitting: 10.6  ? Smokeless tobacco: Never  ?Vaping Use  ? Vaping Use: Never used  ?Substance and Sexual Activity  ? Alcohol use: Not Currently  ? Drug use: Not Currently  ? Sexual activity: Not on file  ?Other Topics Concern  ? Not on file  ?Social History Narrative  ? Not on file  ? ?Social Determinants of Health  ? ?Financial Resource Strain: Not on file  ?Food Insecurity: Not on file  ?Transportation Needs: Not on file  ?Physical Activity: Not on file  ?Stress: Not on file  ?Social Connections: Not on file  ? ?Past Surgical History:  ?Procedure Laterality Date  ? HERNIA REPAIR    ? SPINE SURGERY    ? urinary tract surgery    ? ?Past Surgical History:  ?Procedure Laterality Date  ? HERNIA REPAIR    ? SPINE SURGERY    ? urinary tract surgery    ? ?Past Medical History:  ?Diagnosis Date  ? Anxiety   ? Chronic pain syndrome   ? 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 Facet syndrome, lumbar   ? Kyphoscoliosis and scoliosis   ? Lumbago   ? ?BP 137/88   Pulse (!) 106   Ht 5' 5.5" (1.664 m)   Wt 267 lb 3.2 oz (121.2 kg)   SpO2 98%   BMI 43.79 kg/m?  ? ?Opioid Risk Score:   ?Fall Risk Score:  `1 ? ?Depression screen PHQ  2/9 ? ? ?  02/01/2022  ?  2:37 PM 11/30/2021  ?  1:29 PM 06/01/2021  ?  1:06 PM 04/06/2021  ?  1:54 PM 12/01/2020  ?  2:06 PM 05/30/2020  ? 11:11 AM 03/31/2020  ?  2:04 PM  ?Depression screen PHQ 2/9  ?Decreased Interest 0 0 0 0 0 0 0  ?Down, Depressed, Hopeless 0 0 0 0 0 0 0  ?PHQ - 2 Score 0 0 0 0 0 0 0  ?  ? ? ?Review of Systems  ?Constitutional: Negative.   ?HENT: Negative.    ?Eyes: Negative.   ?Respiratory: Negative.    ?Cardiovascular: Negative.   ?Gastrointestinal: Negative.   ?Endocrine: Negative.   ?Genitourinary: Negative.   ?Musculoskeletal:  Positive for back pain and neck pain.  ?Skin: Negative.   ?Allergic/Immunologic: Negative.   ?Neurological: Negative.   ?Hematological: Negative.   ?Psychiatric/Behavioral: Negative.    ?All other  systems reviewed and are negative. ? ?   ?Objective:  ? Physical Exam ?Vitals and nursing note reviewed.  ?Constitutional:   ?   Appearance: Normal appearance.  ?Neck:  ?   Comments: Cervical Paraspinal Tenderness: C-4-C-6 ?Cardiovascular:  ?   Rate and Rhythm: Normal rate and regular rhythm.  ?   Pulses: Normal pulses.  ?   Heart sounds: Normal heart sounds.  ?Pulmonary:  ?   Effort: Pulmonary effort is normal.  ?   Breath sounds: Normal breath sounds.  ?   Comments: Continuous Oxygen@ 3 liters nasal cannula ?Musculoskeletal:  ?   Cervical back: Normal range of motion and neck supple.  ?   Comments: Normal Muscle Bulk and Muscle Testing Reveals: ? Upper Extremities:Full  ROM and Muscle Strength 5/5 ?Bilateral AC Joint Tenderness ?Thoracic Paraspinal Tenderness: T-7-T-9 ?Lumbar Paraspinal Tenderness: L-4-L-5 ?Bilateral Greater Trochanter Tenderness ?Lower Extremities: Full ROM and Muscle Strength 5/5 ?Arises from Table Slowly ?Narrow Based  Gait  ?   ?Skin: ?   General: Skin is warm and dry.  ?Neurological:  ?   Mental Status: He is alert and oriented to person, place, and time.  ?Psychiatric:     ?   Mood and Affect: Mood normal.     ?   Behavior: Behavior normal.  ? ? ? ? ?   ?Assessment & Plan:  ?1. 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 02/01/2022 ?2. Lumbar Radiculitis/ Chronic Back Pain/ Lumbar Spondylosis: MS Contin is effective for pain management and makes a great difference in his quality of Life. Continue using Pamelor for neuropathy. Also recommended alternating heat and ice for shoulder pain.  02/01/2022. ?Refilled: MS Contin 100mg  #60,---use one tablet every 12 hours. Second script sent for the following month. 02/01/2022 ?We will continue the opioid monitoring program, this consists of regular clinic visits, examinations, urine drug screen, pill counts as well as use of 02/03/2022 Controlled Substance Reporting system. A 12 month History has been reviewed on the [place] Controlled Substance Reporting System on 02/01/2022. ?3. Anxiety: Continue Xanax. Continue to Monitor.04/282023 ?4. Insomnia: Continue Ambien. Continue to Monitor. 02/01/2022. ?5. Muscle Spasm: Continue Flexeril. Continue to monitor. 04/282023. ?6. Cervicalgia: Cervical Radiculitis Continue HEP as tolerated. Continue to monitor. 02/01/2022 ?7. Bilateral Greater Trochanter Bursitis: Continue to Alternate Ice and Heat Therapy. Continue current medication. Continue to Monitor. 02/01/2022 ?  ?F/U in 2 months ?  ?  ? ? ? ? ? ? ? ? ? ? ?

## 2022-02-04 ENCOUNTER — Telehealth: Payer: Self-pay | Admitting: *Deleted

## 2022-02-04 LAB — TOXASSURE SELECT,+ANTIDEPR,UR

## 2022-02-04 NOTE — Telephone Encounter (Signed)
Urine drug screen for this encounter is consistent for prescribed medication 

## 2022-02-23 ENCOUNTER — Other Ambulatory Visit: Payer: Self-pay | Admitting: Registered Nurse

## 2022-02-23 DIAGNOSIS — Q870 Congenital malformation syndromes predominantly affecting facial appearance: Secondary | ICD-10-CM

## 2022-02-25 ENCOUNTER — Other Ambulatory Visit: Payer: Self-pay | Admitting: *Deleted

## 2022-02-25 DIAGNOSIS — Q870 Congenital malformation syndromes predominantly affecting facial appearance: Secondary | ICD-10-CM

## 2022-02-25 MED ORDER — ZOLPIDEM TARTRATE 10 MG PO TABS: 10.0000 mg | ORAL_TABLET | Freq: Every evening | ORAL | 2 refills | Status: DC | PRN

## 2022-03-27 ENCOUNTER — Telehealth: Payer: Self-pay | Admitting: Registered Nurse

## 2022-03-27 ENCOUNTER — Telehealth: Payer: Self-pay

## 2022-03-27 DIAGNOSIS — Q870 Congenital malformation syndromes predominantly affecting facial appearance: Secondary | ICD-10-CM

## 2022-03-27 DIAGNOSIS — G894 Chronic pain syndrome: Secondary | ICD-10-CM

## 2022-03-27 DIAGNOSIS — M47816 Spondylosis without myelopathy or radiculopathy, lumbar region: Secondary | ICD-10-CM

## 2022-03-27 MED ORDER — ZOLPIDEM TARTRATE 10 MG PO TABS: 10.0000 mg | ORAL_TABLET | Freq: Every evening | ORAL | 0 refills | Status: DC | PRN

## 2022-03-27 MED ORDER — MORPHINE SULFATE ER 100 MG PO TBCR: 100.0000 mg | EXTENDED_RELEASE_TABLET | Freq: Two times a day (BID) | ORAL | 0 refills | Status: DC

## 2022-03-27 NOTE — Telephone Encounter (Signed)
PMP was Reviewed.  Morphine prescription was e-scribed today.  Placed a call to Mr. Dibello regarding the above, he verbalizes understanding.   Ambien e- scribed today, [facility] called:  Ambien will be ready at 6:00 pm.  Placed a call to Mr. Carrizales regarding the above, she verbalizes understanding.

## 2022-03-27 NOTE — Telephone Encounter (Signed)
Kenneth Farrell would like to speak to you about his Ambien &  Morphine prescriptions. Ambien need to be sent to [facility] in [place], ASAP. He is currently on vacation.    Morphine to [facility] in [place] for pick up next week.    Call back phone [phone].

## 2022-03-29 ENCOUNTER — Ambulatory Visit: Payer: Medicare Other | Admitting: Registered Nurse

## 2022-04-04 ENCOUNTER — Telehealth: Payer: Self-pay

## 2022-04-04 NOTE — Telephone Encounter (Signed)
PA submitted for Morphine Sulfate 

## 2022-04-19 ENCOUNTER — Encounter: Payer: Medicare Other | Admitting: Registered Nurse

## 2022-04-25 ENCOUNTER — Encounter: Payer: Medicare Other | Attending: Registered Nurse | Admitting: Registered Nurse

## 2022-04-25 ENCOUNTER — Encounter: Payer: Self-pay | Admitting: Registered Nurse

## 2022-04-25 VITALS — BP 132/78 | HR 103 | Ht 65.5 in | Wt 273.0 lb

## 2022-04-25 DIAGNOSIS — Z5181 Encounter for therapeutic drug level monitoring: Secondary | ICD-10-CM | POA: Insufficient documentation

## 2022-04-25 DIAGNOSIS — M47816 Spondylosis without myelopathy or radiculopathy, lumbar region: Secondary | ICD-10-CM | POA: Diagnosis not present

## 2022-04-25 DIAGNOSIS — Q870 Congenital malformation syndromes predominantly affecting facial appearance: Secondary | ICD-10-CM | POA: Diagnosis not present

## 2022-04-25 DIAGNOSIS — M542 Cervicalgia: Secondary | ICD-10-CM | POA: Diagnosis not present

## 2022-04-25 DIAGNOSIS — M5412 Radiculopathy, cervical region: Secondary | ICD-10-CM | POA: Diagnosis present

## 2022-04-25 DIAGNOSIS — Z79891 Long term (current) use of opiate analgesic: Secondary | ICD-10-CM | POA: Diagnosis present

## 2022-04-25 DIAGNOSIS — G894 Chronic pain syndrome: Secondary | ICD-10-CM | POA: Diagnosis present

## 2022-04-25 DIAGNOSIS — M5416 Radiculopathy, lumbar region: Secondary | ICD-10-CM | POA: Insufficient documentation

## 2022-04-25 MED ORDER — MORPHINE SULFATE ER 100 MG PO TBCR: 100.0000 mg | EXTENDED_RELEASE_TABLET | Freq: Two times a day (BID) | ORAL | 0 refills | Status: DC

## 2022-04-25 MED ORDER — ALPRAZOLAM 1 MG PO TABS: ORAL_TABLET | ORAL | 2 refills | Status: DC

## 2022-04-25 MED ORDER — ZOLPIDEM TARTRATE 10 MG PO TABS: 10.0000 mg | ORAL_TABLET | Freq: Every evening | ORAL | 2 refills | Status: DC | PRN

## 2022-04-25 MED ORDER — NORTRIPTYLINE HCL 50 MG PO CAPS: ORAL_CAPSULE | ORAL | 1 refills | Status: DC

## 2022-04-25 NOTE — Progress Notes (Signed)
Subjective:    Patient ID: Kenneth Farrell, male    DOB: [birth year], 51 y.o.   MRN: [id number]  HPI: Kenneth Farrell is a 51 y.o. male who returns for follow up appointment for chronic pain and medication refill. He states his pain is located in his neck radiaiting into his bilateral shoulders, lower back pain radiaiting into his bilateral hips and bilateral lower extremities. He also reports bilateral knee pain. He . rates his pain 8. His current exercise regime is walking and performing stretching exercises.  Mr. Danis Morphine equivalent is 200.00 MME.  He is also prescribed Alprazolam .We have discussed the black box warning of using opioids and benzodiazepines. I highlighted the dangers of using these drugs together and discussed the adverse events including respiratory suppression, overdose, cognitive impairment and importance of compliance with current regimen. We will continue to monitor and adjust as indicated.    Last UDS was Performed on 02/01/2022, it was consistent.   Kenneth Farrell   Pain Inventory Average Pain 8 Pain Right Now 8 My pain is constant, sharp, burning, dull, stabbing, tingling, and aching  In the last 24 hours, has pain interfered with the following? General activity 7 Relation with others 8 Enjoyment of life 8 What TIME of day is your pain at its worst? morning , daytime, evening, and night Sleep (in general) Fair  Pain is worse with: walking, bending, sitting, inactivity, standing, unsure, and some activites Pain improves with: rest, heat/ice, therapy/exercise, pacing activities, and medication Relief from Meds: 3  Family History  Problem Relation Age of Onset  . Hypertension Mother   . Hypertension Father    Social History   Socioeconomic History  . Marital status: Single    Spouse name: Not on file  . Number of children: Not on file  . Years of education: Not on file  . Highest education level: Not on file  Occupational History  . Not on file   Tobacco Use  . Smoking status: Former    Types: Cigarettes    Quit date: 06/27/2011    Years since quitting: 10.8  . Smokeless tobacco: Never  Vaping Use  . Vaping Use: Never used  Substance and Sexual Activity  . Alcohol use: Not Currently  . Drug use: Not Currently  . Sexual activity: Not on file  Other Topics Concern  . Not on file  Social History Narrative  . Not on file   Social Determinants of Health   Financial Resource Strain: Not on file  Food Insecurity: Not on file  Transportation Needs: Not on file  Physical Activity: Not on file  Stress: Not on file  Social Connections: Not on file   Past Surgical History:  Procedure Laterality Date  . HERNIA REPAIR    . SPINE SURGERY    . urinary tract surgery     Past Surgical History:  Procedure Laterality Date  . HERNIA REPAIR    . SPINE SURGERY    . urinary tract surgery     Past Medical History:  Diagnosis Date  . Anxiety   . Chronic pain syndrome   . 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 Facet syndrome, lumbar   . Kyphoscoliosis and scoliosis   . Lumbago    Ht 5' 5.5" (1.664 m)   Wt 273 lb (123.8 kg)   BMI 44.74 kg/m   Opioid Risk Score:   Fall Risk Score:  `1  Depression screen [facility] 2/9  04/25/2022    8:39 AM 02/01/2022    2:37 PM 11/30/2021    1:29 PM 06/01/2021    1:06 PM 04/06/2021    1:54 PM 12/01/2020    2:06 PM 05/30/2020   11:11 AM  Depression screen PHQ 2/9  Decreased Interest 0 0 0 0 0 0 0  Down, Depressed, Hopeless  0 0 0 0 0 0  PHQ - 2 Score 0 0 0 0 0 0 0    Review of Systems  Musculoskeletal:  Positive for arthralgias, back pain, gait problem and neck pain.       Pain all over the body  All other systems reviewed and are negative.      Objective:   Physical Exam Vitals and nursing note reviewed.  Constitutional:      Appearance: Normal appearance.  Neck:     Comments: Cervical Paraspinal Tenderness: C-5- C-6 Cardiovascular:     Rate and Rhythm: Normal rate and  regular rhythm.     Pulses: Normal pulses.     Heart sounds: Normal heart sounds.  Pulmonary:     Effort: Pulmonary effort is normal.     Breath sounds: Normal breath sounds.     Comments: Continuous Oxygen at 3 Liters Nasal Cannula  Abdominal:     General: There is distension.  Musculoskeletal:     Cervical back: Normal range of motion and neck supple.     Comments: Normal Muscle Bulk and Muscle Testing Reveals:  Upper Extremities: Full ROM and Muscle Strength 5/5 Bilateral AC Joint Tenderness Thoracic and Lumbar Hypersensitivity Greater Trochanter Tenderness Lower Extremities: Full ROM and Muscle Strength 5/5 Arises from Table Slowly Narrow Based Gait     Skin:    General: Skin is warm and dry.  Neurological:     Mental Status: He is alert and oriented to person, place, and time.  Psychiatric:        Mood and Affect: Mood normal.        Behavior: Behavior normal.         Assessment & Plan:  1. 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 04/25/2022 2. Lumbar Radiculitis/ Chronic Back Pain/ Lumbar Spondylosis: MS Contin is effective for pain management and makes a great difference in his quality of Life. Continue using Pamelor for neuropathy. Also recommended alternating heat and ice for shoulder pain.  04/25/2022. Refilled: MS Contin 100mg  #60,---use one tablet every 12 hours. Second script sent for the following month. 04/25/2022 We will continue the opioid monitoring program, this consists of regular clinic visits, examinations, urine drug screen, pill counts as well as use of 04/27/2022 Controlled Substance Reporting system. A 12 month History has been reviewed on the [place] Controlled Substance Reporting System on 04/25/2022. 3. Anxiety: Continue Xanax. Continue to Monitor.07/202023 4. Insomnia: Continue Ambien. Continue to Monitor. 04/25/2022. 5. Muscle Spasm: Continue Flexeril. Continue to monitor. 07/202023. 6. Cervicalgia: Cervical  Radiculitis Continue HEP as tolerated. Continue to monitor. 04/25/2022 7. Bilateral Greater Trochanter Bursitis: Continue to Alternate Ice and Heat Therapy. Continue current medication. Continue to Monitor. 04/25/2022   F/U in 2 months

## 2022-05-01 ENCOUNTER — Encounter: Payer: Self-pay | Admitting: Registered Nurse

## 2022-06-25 ENCOUNTER — Telehealth: Payer: Self-pay | Admitting: Registered Nurse

## 2022-06-25 DIAGNOSIS — G894 Chronic pain syndrome: Secondary | ICD-10-CM

## 2022-06-25 DIAGNOSIS — M47816 Spondylosis without myelopathy or radiculopathy, lumbar region: Secondary | ICD-10-CM

## 2022-06-25 DIAGNOSIS — Q870 Congenital malformation syndromes predominantly affecting facial appearance: Secondary | ICD-10-CM

## 2022-06-25 MED ORDER — MORPHINE SULFATE ER 100 MG PO TBCR: 100.0000 mg | EXTENDED_RELEASE_TABLET | Freq: Two times a day (BID) | ORAL | 0 refills | Status: DC

## 2022-06-25 NOTE — Telephone Encounter (Signed)
PMP was Reviewed,  MS Contin e-scribed  Kenneth Farrell is aware via My-Chart message

## 2022-07-05 ENCOUNTER — Encounter: Payer: Medicare Other | Admitting: Registered Nurse

## 2022-07-08 ENCOUNTER — Telehealth: Payer: Self-pay | Admitting: Registered Nurse

## 2022-07-08 DIAGNOSIS — Q870 Congenital malformation syndromes predominantly affecting facial appearance: Secondary | ICD-10-CM

## 2022-07-08 DIAGNOSIS — G894 Chronic pain syndrome: Secondary | ICD-10-CM

## 2022-07-08 DIAGNOSIS — M47816 Spondylosis without myelopathy or radiculopathy, lumbar region: Secondary | ICD-10-CM

## 2022-07-08 NOTE — Telephone Encounter (Signed)
Patient will need a refill on his medication, I scheduled his next appt on 11/3.

## 2022-07-09 MED ORDER — MORPHINE SULFATE ER 100 MG PO TBCR: 100.0000 mg | EXTENDED_RELEASE_TABLET | Freq: Two times a day (BID) | ORAL | 0 refills | Status: DC

## 2022-07-09 NOTE — Telephone Encounter (Signed)
[name] appointment was re-scheduled.  PMP was Reviewed.  Morphine e-scribed to accommodate scheduled appointment. [name] is aware of the above and verbalizes understanding.

## 2022-07-26 ENCOUNTER — Ambulatory Visit: Payer: Medicare Other | Admitting: Registered Nurse

## 2022-07-26 ENCOUNTER — Other Ambulatory Visit: Payer: Self-pay | Admitting: Registered Nurse

## 2022-07-26 ENCOUNTER — Telehealth: Payer: Self-pay | Admitting: Physical Medicine & Rehabilitation

## 2022-07-26 DIAGNOSIS — Q870 Congenital malformation syndromes predominantly affecting facial appearance: Secondary | ICD-10-CM

## 2022-07-26 NOTE — Telephone Encounter (Signed)
ERROR

## 2022-08-09 ENCOUNTER — Encounter: Payer: Medicare Other | Admitting: Registered Nurse

## 2022-08-23 ENCOUNTER — Encounter: Payer: Self-pay | Admitting: Registered Nurse

## 2022-08-23 ENCOUNTER — Encounter: Payer: Medicare Other | Attending: Registered Nurse | Admitting: Registered Nurse

## 2022-08-23 VITALS — BP 154/95 | HR 89 | Ht 67.0 in | Wt 258.6 lb

## 2022-08-23 DIAGNOSIS — M5416 Radiculopathy, lumbar region: Secondary | ICD-10-CM | POA: Insufficient documentation

## 2022-08-23 DIAGNOSIS — M7061 Trochanteric bursitis, right hip: Secondary | ICD-10-CM | POA: Diagnosis present

## 2022-08-23 DIAGNOSIS — G8929 Other chronic pain: Secondary | ICD-10-CM | POA: Diagnosis present

## 2022-08-23 DIAGNOSIS — M25561 Pain in right knee: Secondary | ICD-10-CM | POA: Diagnosis present

## 2022-08-23 DIAGNOSIS — M5412 Radiculopathy, cervical region: Secondary | ICD-10-CM | POA: Diagnosis not present

## 2022-08-23 DIAGNOSIS — Z79891 Long term (current) use of opiate analgesic: Secondary | ICD-10-CM | POA: Insufficient documentation

## 2022-08-23 DIAGNOSIS — M25562 Pain in left knee: Secondary | ICD-10-CM | POA: Insufficient documentation

## 2022-08-23 DIAGNOSIS — G894 Chronic pain syndrome: Secondary | ICD-10-CM | POA: Diagnosis not present

## 2022-08-23 DIAGNOSIS — M47816 Spondylosis without myelopathy or radiculopathy, lumbar region: Secondary | ICD-10-CM | POA: Diagnosis not present

## 2022-08-23 DIAGNOSIS — M7062 Trochanteric bursitis, left hip: Secondary | ICD-10-CM | POA: Insufficient documentation

## 2022-08-23 DIAGNOSIS — Q870 Congenital malformation syndromes predominantly affecting facial appearance: Secondary | ICD-10-CM | POA: Diagnosis not present

## 2022-08-23 DIAGNOSIS — M542 Cervicalgia: Secondary | ICD-10-CM | POA: Insufficient documentation

## 2022-08-23 DIAGNOSIS — Z5181 Encounter for therapeutic drug level monitoring: Secondary | ICD-10-CM | POA: Insufficient documentation

## 2022-08-23 MED ORDER — ZOLPIDEM TARTRATE 10 MG PO TABS: 10.0000 mg | ORAL_TABLET | Freq: Every evening | ORAL | 2 refills | Status: DC | PRN

## 2022-08-23 MED ORDER — MORPHINE SULFATE ER 100 MG PO TBCR: 100.0000 mg | EXTENDED_RELEASE_TABLET | Freq: Two times a day (BID) | ORAL | 0 refills | Status: DC

## 2022-08-23 MED ORDER — ALPRAZOLAM 1 MG PO TABS: ORAL_TABLET | ORAL | 2 refills | Status: DC

## 2022-08-23 MED ORDER — CYCLOBENZAPRINE HCL 5 MG PO TABS: ORAL_TABLET | ORAL | 4 refills | Status: DC

## 2022-08-23 NOTE — Progress Notes (Unsigned)
Subjective:    Patient ID: Kenneth Farrell, male    DOB: [birth year], 51 y.o.   MRN: [id number]  [id number] Kenneth Farrell is a 51 y.o. male who returns for follow up appointment for chronic pain and medication refill. states *** pain is located in  ***. rates pain ***. current exercise regime is walking and performing stretching exercises.  Kenneth Farrell Morphine equivalent is 200.00 MME.        Pain Inventory Average Pain 8 Pain Right Now 8 My pain is intermittent, constant, sharp, burning, dull, stabbing, tingling, and aching  In the last 24 hours, has pain interfered with the following? General activity 8 Relation with others 8 Enjoyment of life 8 What TIME of day is your pain at its worst? morning , daytime, evening, and night Sleep (in general) Fair  Pain is worse with: walking, bending, sitting, inactivity, standing, unsure, and some activites Pain improves with: rest, heat/ice, therapy/exercise, pacing activities, and medication Relief from Meds: 3  Family History  Problem Relation Age of Onset   Hypertension Mother    Hypertension Father    Social History   Socioeconomic History   Marital status: Single    Spouse name: Not on file   Number of children: Not on file   Years of education: Not on file   Highest education level: Not on file  Occupational History   Not on file  Tobacco Use   Smoking status: Former    Types: Cigarettes    Quit date: 06/27/2011    Years since quitting: 11.1   Smokeless tobacco: Never  Vaping Use   Vaping Use: Never used  Substance and Sexual Activity   Alcohol use: Not Currently   Drug use: Not Currently   Sexual activity: Not on file  Other Topics Concern   Not on file  Social History Narrative   Not on file   Social Determinants of Health   Financial Resource Strain: Not on file  Food Insecurity: Not on file  Transportation Needs: Not on file  Physical Activity: Not on file  Stress: Not on file  Social Connections: Not on file    Past Surgical History:  Procedure Laterality Date   HERNIA REPAIR     SPINE SURGERY     urinary tract surgery     Past Surgical History:  Procedure Laterality Date   HERNIA REPAIR     SPINE SURGERY     urinary tract surgery     Past Medical History:  Diagnosis Date   Anxiety    Chronic pain syndrome    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  Opioid Risk Score:   Fall Risk Score:  `1  Depression screen [facility] 2/9     04/25/2022    8:39 AM 02/01/2022    2:37 PM 11/30/2021    1:29 PM 06/01/2021    1:06 PM 04/06/2021    1:54 PM 12/01/2020    2:06 PM 05/30/2020   11:11 AM  Depression screen PHQ 2/9  Decreased Interest 0 0 0 0 0 0 0  Down, Depressed, Hopeless  0 0 0 0 0 0  PHQ - 2 Score 0 0 0 0 0 0 0      Review of Systems  Constitutional: Negative.   HENT: Negative.    Eyes: Negative.   Respiratory:  Negative.    Endocrine: Negative.   Genitourinary: Negative.   Musculoskeletal:  Positive for back pain, myalgias and neck pain.  Skin: Negative.   Allergic/Immunologic: Negative.   Neurological: Negative.   Hematological: Negative.   Psychiatric/Behavioral: Negative.        Objective:   Physical Exam        Assessment & Plan:  1. 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 04/25/2022 2. Lumbar Radiculitis/ Chronic Back Pain/ Lumbar Spondylosis: MS Contin is effective for pain management and makes a great difference in his quality of Life. Continue using Pamelor for neuropathy. Also recommended alternating heat and ice for shoulder pain.  04/25/2022. Refilled: MS Contin 100mg  #60,---use one tablet every 12 hours. Second script sent for the following month. 04/25/2022 We will continue the opioid monitoring program, this consists of regular clinic visits, examinations, urine drug screen, pill counts as well as use of 04/27/2022 Controlled Substance Reporting system. A 12 month History has been reviewed on the [place] Controlled Substance Reporting System on 04/25/2022. 3. Anxiety: Continue Xanax. Continue to Monitor.07/202023 4. Insomnia: Continue Ambien. Continue to Monitor. 04/25/2022. 5. Muscle Spasm: Continue Flexeril. Continue to monitor. 07/202023. 6. Cervicalgia: Cervical Radiculitis Continue HEP as tolerated. Continue to monitor. 04/25/2022 7. Bilateral Greater Trochanter Bursitis: Continue to Alternate Ice and Heat Therapy. Continue current medication. Continue to Monitor. 04/25/2022   F/U in 2 months

## 2022-08-28 LAB — TOXASSURE SELECT,+ANTIDEPR,UR

## 2022-09-19 ENCOUNTER — Telehealth: Payer: Self-pay | Admitting: *Deleted

## 2022-09-19 NOTE — Telephone Encounter (Signed)
Urine drug screen for this encounter is consistent for prescribed medication 

## 2022-10-11 ENCOUNTER — Telehealth: Payer: Self-pay | Admitting: Registered Nurse

## 2022-10-11 DIAGNOSIS — Q870 Congenital malformation syndromes predominantly affecting facial appearance: Secondary | ICD-10-CM

## 2022-10-11 DIAGNOSIS — M47816 Spondylosis without myelopathy or radiculopathy, lumbar region: Secondary | ICD-10-CM

## 2022-10-11 DIAGNOSIS — G894 Chronic pain syndrome: Secondary | ICD-10-CM

## 2022-10-11 MED ORDER — MORPHINE SULFATE ER 100 MG PO TBCR: 100.0000 mg | EXTENDED_RELEASE_TABLET | Freq: Two times a day (BID) | ORAL | 0 refills | Status: DC

## 2022-10-11 NOTE — Telephone Encounter (Signed)
PMP was Reviewed.  MS Contin e-scribed today. Mr. Espin is aware via My-Chart message.

## 2022-10-18 ENCOUNTER — Encounter: Payer: Medicare Other | Admitting: Registered Nurse

## 2022-10-25 ENCOUNTER — Ambulatory Visit: Payer: Medicare Other | Admitting: Registered Nurse

## 2022-11-22 ENCOUNTER — Encounter: Payer: Medicare Other | Attending: Registered Nurse | Admitting: Registered Nurse

## 2022-11-22 ENCOUNTER — Encounter: Payer: Self-pay | Admitting: Registered Nurse

## 2022-11-22 VITALS — BP 148/99 | HR 92 | Ht 67.0 in | Wt 258.0 lb

## 2022-11-22 DIAGNOSIS — M546 Pain in thoracic spine: Secondary | ICD-10-CM | POA: Diagnosis present

## 2022-11-22 DIAGNOSIS — M7061 Trochanteric bursitis, right hip: Secondary | ICD-10-CM | POA: Insufficient documentation

## 2022-11-22 DIAGNOSIS — Z79891 Long term (current) use of opiate analgesic: Secondary | ICD-10-CM | POA: Insufficient documentation

## 2022-11-22 DIAGNOSIS — M47816 Spondylosis without myelopathy or radiculopathy, lumbar region: Secondary | ICD-10-CM | POA: Insufficient documentation

## 2022-11-22 DIAGNOSIS — G8929 Other chronic pain: Secondary | ICD-10-CM

## 2022-11-22 DIAGNOSIS — M542 Cervicalgia: Secondary | ICD-10-CM | POA: Diagnosis present

## 2022-11-22 DIAGNOSIS — Z5181 Encounter for therapeutic drug level monitoring: Secondary | ICD-10-CM | POA: Diagnosis present

## 2022-11-22 DIAGNOSIS — G894 Chronic pain syndrome: Secondary | ICD-10-CM | POA: Diagnosis present

## 2022-11-22 DIAGNOSIS — M25562 Pain in left knee: Secondary | ICD-10-CM | POA: Diagnosis present

## 2022-11-22 DIAGNOSIS — M25561 Pain in right knee: Secondary | ICD-10-CM | POA: Diagnosis present

## 2022-11-22 DIAGNOSIS — M5412 Radiculopathy, cervical region: Secondary | ICD-10-CM | POA: Diagnosis present

## 2022-11-22 DIAGNOSIS — F5101 Primary insomnia: Secondary | ICD-10-CM | POA: Insufficient documentation

## 2022-11-22 DIAGNOSIS — Q870 Congenital malformation syndromes predominantly affecting facial appearance: Secondary | ICD-10-CM | POA: Insufficient documentation

## 2022-11-22 DIAGNOSIS — M7062 Trochanteric bursitis, left hip: Secondary | ICD-10-CM | POA: Diagnosis present

## 2022-11-22 DIAGNOSIS — M5416 Radiculopathy, lumbar region: Secondary | ICD-10-CM | POA: Diagnosis present

## 2022-11-22 MED ORDER — ZOLPIDEM TARTRATE 10 MG PO TABS: 10.0000 mg | ORAL_TABLET | Freq: Every evening | ORAL | 2 refills | Status: DC | PRN

## 2022-11-22 MED ORDER — NORTRIPTYLINE HCL 50 MG PO CAPS: ORAL_CAPSULE | ORAL | 1 refills | Status: DC

## 2022-11-22 MED ORDER — MORPHINE SULFATE ER 100 MG PO TBCR: 100.0000 mg | EXTENDED_RELEASE_TABLET | Freq: Two times a day (BID) | ORAL | 0 refills | Status: DC

## 2022-11-22 MED ORDER — ALPRAZOLAM 1 MG PO TABS: ORAL_TABLET | ORAL | 2 refills | Status: DC

## 2022-11-22 NOTE — Progress Notes (Signed)
Subjective:    Patient ID: Kenneth Farrell, male    DOB: 1971/05/17, 52 y.o.   MRN: [id number]  HPI: BRAXXTON Farrell is a 52 y.o. male who returns for follow up appointment for chronic pain and medication refill. He states his pain is located in his neck radiating into his bilateral shoulders, mid- lower back pain radiating into his bilateral hips and bilateral knee pain. He rates his pain 8. His current exercise regime is walking and performing stretching exercises.  M. Bultema Morphine equivalent is 200.00 MME.   Last UDS was Performed on 08/23/2022, it was consistent.     Pain Inventory Average Pain 9 Pain Right Now 8 My pain is sharp, burning, dull, stabbing, tingling, and aching  In the last 24 hours, has pain interfered with the following? General activity 8 Relation with others 8 Enjoyment of life 8 What TIME of day is your pain at its worst? morning , daytime, evening, and night Sleep (in general) Fair  Pain is worse with: walking, bending, sitting, inactivity, standing, and some activites Pain improves with: rest, heat/ice, therapy/exercise, pacing activities, and medication Relief from Meds: 3  Family History  Problem Relation Age of Onset   Hypertension Mother    Hypertension Father    Social History   Socioeconomic History   Marital status: Single    Spouse name: Not on file   Number of children: Not on file   Years of education: Not on file   Highest education level: Not on file  Occupational History   Not on file  Tobacco Use   Smoking status: Former    Types: Cigarettes    Quit date: 06/27/2011    Years since quitting: 11.4   Smokeless tobacco: Never  Vaping Use   Vaping Use: Never used  Substance and Sexual Activity   Alcohol use: Not Currently   Drug use: Not Currently   Sexual activity: Not on file  Other Topics Concern   Not on file  Social History Narrative   Not on file   Social Determinants of Health   Financial Resource Strain: Not on  file  Food Insecurity: Not on file  Transportation Needs: Not on file  Physical Activity: Not on file  Stress: Not on file  Social Connections: Not on file   Past Surgical History:  Procedure Laterality Date   HERNIA REPAIR     SPINE SURGERY     urinary tract surgery     Past Surgical History:  Procedure Laterality Date   HERNIA REPAIR     SPINE SURGERY     urinary tract surgery     Past Medical History:  Diagnosis Date   Anxiety    Chronic pain syndrome    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  Opioid Risk Score:   Fall Risk Score:  `1  Depression screen [facility] 2/9     04/25/2022    8:39 AM 02/01/2022    2:37 PM 11/30/2021    1:29 PM 06/01/2021    1:06 PM 04/06/2021    1:54 PM 12/01/2020    2:06 PM 05/30/2020   11:11 AM  Depression screen PHQ 2/9  Decreased Interest 0 0 0 0 0 0 0  Down, Depressed, Hopeless  0 0 0 0 0 0  PHQ - 2 Score 0 0  0 0 0 0 0     Review of Systems  Musculoskeletal:  Positive for back pain, gait problem and neck pain.  All other systems reviewed and are negative.     Objective:   Physical Exam Vitals and nursing note reviewed.  Constitutional:      Appearance: Normal appearance.  Neck:     Comments: Cervical Paraspinal Tenderness: C-5-C-6  Cardiovascular:     Rate and Rhythm: Normal rate and regular rhythm.     Pulses: Normal pulses.     Heart sounds: Normal heart sounds. No murmur heard. Pulmonary:     Effort: Pulmonary effort is normal.     Breath sounds: Normal breath sounds.     Comments: Continuous Oxygen @ 3 liters nasal cannula Musculoskeletal:     Cervical back: Normal range of motion and neck supple.     Comments: Normal Muscle Bulk and Muscle Testing Reveals:  Upper Extremities: Full ROM and Muscle Strength 5/5 Thoracic Paraspinal Tenderness: T-7-T-9 Lumbar Paraspinal Tenderness: L-3-L-5 Lower  Extremities: Full ROM and Muscle Strength 5/5 Arises from Table slowly  Narrow Based  Gait     Skin:    General: Skin is warm and dry.  Neurological:     Mental Status: He is alert and oriented to person, place, and time.  Psychiatric:        Mood and Affect: Mood normal.        Behavior: Behavior normal.         Assessment & Plan:  1. 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 11/22/2022 2. Lumbar Radiculitis/ Chronic Back Pain/ Lumbar Spondylosis: MS Contin is effective for pain management and makes a great difference in his quality of Life. Continue using Pamelor for neuropathy. Also recommended alternating heat and ice for shoulder pain.  11/22/2022. Refilled: MS Contin 163m #60,---use one tablet every 12 hours. Second script sent for the following month. 11/22/2022 We will continue the opioid monitoring program, this consists of regular clinic visits, examinations, urine drug screen, pill counts [place]22[place]. 3. Anxiety: Continue Xanax. Continue to Monitor.02/162024 4. Insomnia: Continue Ambien. Continue to Monitor. 11/22/2022. 5. Muscle Spasm: Continue Flexeril. Continue to monitor. 02/162024. 6. Cervicalgia: Cervical Radiculitis Continue HEP as tolerated. Continue to monitor. 11/22/2022 7. Bilateral Greater Trochanter Bursitis: Continue to Alternate Ice and Heat Therapy. Continue current medication. Continue to Monitor. 11/22/2022 8. Hypertension: Blood Pressure was re-checked. Mr. HSchlagstates he is compliant with his anti-hypertensive medication, he had his medication today. .Marland KitchenPCP following. Continue to Monitor.   F/U in 2 months

## 2022-11-29 ENCOUNTER — Encounter: Payer: Medicare Other | Admitting: Registered Nurse

## 2023-01-10 ENCOUNTER — Ambulatory Visit: Payer: Medicare Other | Admitting: Registered Nurse

## 2023-01-23 ENCOUNTER — Encounter: Payer: Self-pay | Admitting: Registered Nurse

## 2023-01-23 ENCOUNTER — Encounter: Payer: Medicare Other | Attending: Registered Nurse | Admitting: Registered Nurse

## 2023-01-23 VITALS — Ht 68.0 in | Wt 255.0 lb

## 2023-01-23 DIAGNOSIS — M542 Cervicalgia: Secondary | ICD-10-CM | POA: Diagnosis not present

## 2023-01-23 DIAGNOSIS — M7061 Trochanteric bursitis, right hip: Secondary | ICD-10-CM | POA: Insufficient documentation

## 2023-01-23 DIAGNOSIS — M47816 Spondylosis without myelopathy or radiculopathy, lumbar region: Secondary | ICD-10-CM | POA: Diagnosis not present

## 2023-01-23 DIAGNOSIS — G4700 Insomnia, unspecified: Secondary | ICD-10-CM

## 2023-01-23 DIAGNOSIS — Z5181 Encounter for therapeutic drug level monitoring: Secondary | ICD-10-CM | POA: Insufficient documentation

## 2023-01-23 DIAGNOSIS — G8929 Other chronic pain: Secondary | ICD-10-CM | POA: Insufficient documentation

## 2023-01-23 DIAGNOSIS — G894 Chronic pain syndrome: Secondary | ICD-10-CM | POA: Insufficient documentation

## 2023-01-23 DIAGNOSIS — M5412 Radiculopathy, cervical region: Secondary | ICD-10-CM | POA: Insufficient documentation

## 2023-01-23 DIAGNOSIS — M5416 Radiculopathy, lumbar region: Secondary | ICD-10-CM | POA: Insufficient documentation

## 2023-01-23 DIAGNOSIS — Q870 Congenital malformation syndromes predominantly affecting facial appearance: Secondary | ICD-10-CM

## 2023-01-23 DIAGNOSIS — F419 Anxiety disorder, unspecified: Secondary | ICD-10-CM | POA: Diagnosis not present

## 2023-01-23 DIAGNOSIS — M7062 Trochanteric bursitis, left hip: Secondary | ICD-10-CM | POA: Insufficient documentation

## 2023-01-23 DIAGNOSIS — M25561 Pain in right knee: Secondary | ICD-10-CM | POA: Insufficient documentation

## 2023-01-23 DIAGNOSIS — M25562 Pain in left knee: Secondary | ICD-10-CM | POA: Insufficient documentation

## 2023-01-23 MED ORDER — MORPHINE SULFATE ER 100 MG PO TBCR: 100.0000 mg | EXTENDED_RELEASE_TABLET | Freq: Two times a day (BID) | ORAL | 0 refills | Status: DC

## 2023-01-23 NOTE — Progress Notes (Addendum)
Subjective:    Patient ID: Kenneth Farrell, male    DOB: 1971/02/11, 52 y.o.   MRN: [id number]  HPI: Kenneth Farrell is a 52 y.o. male who  is scheduled for My- Chart  Video Visit, due to starting a new job and unable to take off at this time. We have  discussed the limitations of evaluation and management by telemedicine and the availability of in person appointments. The patient expressed understanding and agreed to proceed.  He states his pain is located in his neck radiating into his bilateral shoulders, lower back pain radiating into his bilateral lower extremities and bilateral hip pain. He also reports bilateral knee pain He rates his pain 7. His current exercise regime is walking and performing stretching exercises.  Kenneth Farrell Morphine equivalent is 200.00  MME.   Last UDS was Performed on 08/23/2022, it was consistent.    Pain Inventory Average Pain 7 Pain Right Now 7 My pain is intermittent, sharp, burning, dull, tingling, and aching  In the last 24 hours, has pain interfered with the following? General activity 7 Relation with others 7 Enjoyment of life 7 What TIME of day is your pain at its worst? morning , daytime, evening, and night Sleep (in general) Fair  Pain is worse with: walking, bending, sitting, inactivity, standing, and some activites Pain improves with: rest, heat/ice, therapy/exercise, pacing activities, and medication Relief from Meds: 3  Family History  Problem Relation Age of Onset   Hypertension Mother    Hypertension Father    Social History   Socioeconomic History   Marital status: Married    Spouse name: Not on file   Number of children: Not on file   Years of education: Not on file   Highest education level: Not on file  Occupational History   Not on file  Tobacco Use   Smoking status: Former    Types: Cigarettes    Quit date: 06/27/2011    Years since quitting: 11.5   Smokeless tobacco: Never  Vaping Use   Vaping Use: Never used   Substance and Sexual Activity   Alcohol use: Not Currently   Drug use: Not Currently   Sexual activity: Not on file  Other Topics Concern   Not on file  Social History Narrative   Not on file   Social Determinants of Health   Financial Resource Strain: Not on file  Food Insecurity: Not on file  Transportation Needs: Not on file  Physical Activity: Not on file  Stress: Not on file  Social Connections: Not on file   Past Surgical History:  Procedure Laterality Date   HERNIA REPAIR     SPINE SURGERY     urinary tract surgery     Past Surgical History:  Procedure Laterality Date   HERNIA REPAIR     SPINE SURGERY     urinary tract surgery     Past Medical History:  Diagnosis Date   Anxiety    Chronic pain syndrome    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    Facet syndrome, lumbar    Kyphoscoliosis and scoliosis    Lumbago    Ht  (1.727 m)   Wt 255 lb (115.7 kg)   BMI 38.77 kg/m   Opioid Risk Score:   Fall Risk Score:  `1  Depression screen [facility] 2/9     11/22/2022    2:29 PM 04/25/2022    8:39 AM 02/01/2022    2:37 PM  11/30/2021    1:29 PM 06/01/2021    1:06 PM 04/06/2021    1:54 PM 12/01/2020    2:06 PM  Depression screen PHQ 2/9  Decreased Interest 0 0 0 0 0 0 0  Down, Depressed, Hopeless 0  0 0 0 0 0  PHQ - 2 Score 0 0 0 0 0 0 0      Review of Systems  Musculoskeletal:  Positive for back pain and neck pain.       B/L shoulders, hips, legs, knees  All other systems reviewed and are negative.      Objective:   Physical Exam Vitals and nursing note reviewed.  Musculoskeletal:     Comments: No Physical Exam Performed: Video Visit         Assessment & Plan:  1. 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 01/23/2023 2. Lumbar Radiculitis/ Chronic Back Pain/ Lumbar Spondylosis: MS Contin is effective for pain management and makes a great difference in his quality of Life. Continue using Pamelor for neuropathy. Also  recommended alternating heat and ice for shoulder pain.  01/23/2023. Refilled: MS Contin 100mg  #60,---use one tablet every 12 hours. Second script sent for the following month. 01/23/2023 We will continue the opioid monitoring program, this consists of regular clinic visits, examinations, urine drug screen, pill counts as well as use of [place] Controlled Substance Reporting system. A 12 month History has been reviewed on the [place] Controlled Substance Reporting System on 01/23/2023. 3. Anxiety: Continue Xanax. Continue to Monitor.04/182024 4. Insomnia: Continue Ambien. Continue to Monitor. 01/23/2023. 5. Muscle Spasm: Continue Flexeril. Continue to monitor. 04/182024. 6. Cervicalgia: Cervical Radiculitis Continue HEP as tolerated. Continue to monitor. 01/23/2023 7. Bilateral Greater Trochanter Bursitis: Continue to Alternate Ice and Heat Therapy. Continue current medication. Continue to Monitor. 01/23/2023   F/U in 1 month  My Chart: Video Visit Established Patient Location of Patient: In his Car Location of Provider: In the Office

## 2023-01-24 ENCOUNTER — Encounter: Payer: Medicare Other | Admitting: Registered Nurse

## 2023-02-21 ENCOUNTER — Encounter: Payer: Self-pay | Admitting: Registered Nurse

## 2023-02-21 ENCOUNTER — Encounter: Payer: Medicare Other | Attending: Registered Nurse | Admitting: Registered Nurse

## 2023-02-21 VITALS — BP 143/92 | HR 92 | Ht 68.0 in | Wt 254.0 lb

## 2023-02-21 DIAGNOSIS — Q870 Congenital malformation syndromes predominantly affecting facial appearance: Secondary | ICD-10-CM | POA: Diagnosis present

## 2023-02-21 DIAGNOSIS — G894 Chronic pain syndrome: Secondary | ICD-10-CM | POA: Insufficient documentation

## 2023-02-21 DIAGNOSIS — M62838 Other muscle spasm: Secondary | ICD-10-CM

## 2023-02-21 DIAGNOSIS — F411 Generalized anxiety disorder: Secondary | ICD-10-CM | POA: Insufficient documentation

## 2023-02-21 DIAGNOSIS — M7062 Trochanteric bursitis, left hip: Secondary | ICD-10-CM | POA: Diagnosis present

## 2023-02-21 DIAGNOSIS — M25561 Pain in right knee: Secondary | ICD-10-CM | POA: Diagnosis present

## 2023-02-21 DIAGNOSIS — M5416 Radiculopathy, lumbar region: Secondary | ICD-10-CM | POA: Insufficient documentation

## 2023-02-21 DIAGNOSIS — M5412 Radiculopathy, cervical region: Secondary | ICD-10-CM | POA: Diagnosis present

## 2023-02-21 DIAGNOSIS — M7061 Trochanteric bursitis, right hip: Secondary | ICD-10-CM | POA: Insufficient documentation

## 2023-02-21 DIAGNOSIS — M542 Cervicalgia: Secondary | ICD-10-CM

## 2023-02-21 DIAGNOSIS — F5101 Primary insomnia: Secondary | ICD-10-CM | POA: Insufficient documentation

## 2023-02-21 DIAGNOSIS — M25562 Pain in left knee: Secondary | ICD-10-CM | POA: Diagnosis present

## 2023-02-21 DIAGNOSIS — G8929 Other chronic pain: Secondary | ICD-10-CM | POA: Diagnosis present

## 2023-02-21 DIAGNOSIS — M47816 Spondylosis without myelopathy or radiculopathy, lumbar region: Secondary | ICD-10-CM

## 2023-02-21 MED ORDER — ZOLPIDEM TARTRATE 10 MG PO TABS
10.0000 mg | ORAL_TABLET | Freq: Every evening | ORAL | 2 refills | Status: DC | PRN
Start: 2023-02-21 — End: 2023-05-23

## 2023-02-21 MED ORDER — MORPHINE SULFATE ER 100 MG PO TBCR
100.0000 mg | EXTENDED_RELEASE_TABLET | Freq: Two times a day (BID) | ORAL | 0 refills | Status: DC
Start: 2023-02-21 — End: 2023-04-18

## 2023-02-21 MED ORDER — CYCLOBENZAPRINE HCL 5 MG PO TABS: ORAL_TABLET | ORAL | 4 refills | Status: DC

## 2023-02-21 MED ORDER — MORPHINE SULFATE ER 100 MG PO TBCR
100.0000 mg | EXTENDED_RELEASE_TABLET | Freq: Two times a day (BID) | ORAL | 0 refills | Status: DC
Start: 2023-02-21 — End: 2023-02-21

## 2023-02-21 NOTE — Progress Notes (Signed)
Subjective:    Patient ID: Kenneth Farrell, male    DOB: 11-Mar-1971, 52 y.o.   MRN: [id number]  HPI: Kenneth Farrell is a 52 y.o. male who returns for follow up appointment for chronic pain and medication refill. He states his pain is located in his neck radiating into his bilateral shoulders, mid- lower back pain radiating ito his bilateral hips. Also reports bilateral knee pain. She rates her pain 8. Her current exercise regime is walking and performing stretching exercises.  Mr. Crawn Morphine equivalent is 200.00 MME.   UDS ordered today      Pain Inventory Average Pain 8 Pain Right Now 8 My pain is intermittent, constant, sharp, burning, dull, stabbing, tingling, and aching  In the last 24 hours, has pain interfered with the following? General activity 7 Relation with others 7 Enjoyment of life 7 What TIME of day is your pain at its worst? morning , daytime, evening, and night Sleep (in general) Fair  Pain is worse with: walking, bending, sitting, inactivity, standing, unsure, and some activites Pain improves with: rest, heat/ice, therapy/exercise, pacing activities, and medication Relief from Meds: 3  Family History  Problem Relation Age of Onset   Hypertension Mother    Hypertension Father    Social History   Socioeconomic History   Marital status: Married    Spouse name: Not on file   Number of children: Not on file   Years of education: Not on file   Highest education level: Not on file  Occupational History   Not on file  Tobacco Use   Smoking status: Former    Types: Cigarettes    Quit date: 06/27/2011    Years since quitting: 11.6   Smokeless tobacco: Never  Vaping Use   Vaping Use: Never used  Substance and Sexual Activity   Alcohol use: Not Currently   Drug use: Not Currently   Sexual activity: Not on file  Other Topics Concern   Not on file  Social History Narrative   Not on file   Social Determinants of Health   Financial Resource Strain: Not  on file  Food Insecurity: Not on file  Transportation Needs: Not on file  Physical Activity: Not on file  Stress: Not on file  Social Connections: Not on file   Past Surgical History:  Procedure Laterality Date   HERNIA REPAIR     SPINE SURGERY     urinary tract surgery     Past Surgical History:  Procedure Laterality Date   HERNIA REPAIR     SPINE SURGERY     urinary tract surgery     Past Medical History:  Diagnosis Date   Anxiety    Chronic pain syndrome    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       Pain all over the body  All other systems reviewed and are negative.     Objective:   Physical Exam Vitals and nursing note reviewed.  Constitutional:      Appearance: Normal appearance.  Cardiovascular:     Rate and Rhythm: Normal rate and regular rhythm.     Pulses: Normal pulses.     Heart sounds: Normal heart sounds.  Pulmonary:     Effort: Pulmonary effort is normal.     Breath sounds: Normal breath sounds.     Comments: Continuous Oxygen at 3 Liters Nasal Cannula   Musculoskeletal:     Cervical back: Normal range of motion and neck supple.     Comments: Normal Muscle Bulk and Muscle Testing Reveals:  Upper Extremities:Full  ROM and Muscle Strength 5/5 Bilateral AC Joint Tenderness  Lumbar Hypersensitivity Lower Extremities: Full ROM and Muscle Strength 5/5 Arises  from Table slowly  Narrow Based  Gait     Skin:    General: Skin is warm and dry.  Neurological:     Mental Status: He is alert and oriented to person, place, and time.  Psychiatric:        Mood and Affect: Mood normal.        Behavior: Behavior normal.         Assessment & Plan:  1. 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 02/21/2023 2. Lumbar Radiculitis/ Chronic Back Pain/ Lumbar Spondylosis: MS Contin is effective for pain management and makes a great difference in his quality of Life. Continue using Pamelor for neuropathy. Also recommended alternating heat and ice for shoulder pain.  02/21/2023. Refilled: MS Contin 100mg  #60,---use one tablet every 12 hours. Second script sent for the following month. 02/21/2023 We will continue the opioid monitoring program, this consists of regular clinic visits, examinations, urine drug screen, pill counts as well as use of [place] Controlled Substance Reporting system. A 12 month History has been reviewed on the [place] Controlled Substance Reporting System on 02/21/2023. 3. Anxiety: Continue Xanax. Continue to Monitor.05/172024 4. Insomnia: Continue Ambien. Continue to Monitor. 02/21/2023. 5. Muscle Spasm: Continue Flexeril. Continue to monitor. 05/172024. 6. Cervicalgia: Cervical Radiculitis Continue HEP as tolerated. Continue to monitor. 02/21/2023 7. Bilateral Greater Trochanter Bursitis: Continue to Alternate Ice and Heat Therapy. Continue current medication. Continue to Monitor. 02/21/2023   F/U in 2 months

## 2023-02-27 LAB — TOXASSURE SELECT,+ANTIDEPR,UR

## 2023-03-13 ENCOUNTER — Telehealth: Payer: Self-pay | Admitting: *Deleted

## 2023-03-13 NOTE — Telephone Encounter (Signed)
Urine drug screen for this encounter is consistent for prescribed medication 

## 2023-03-24 ENCOUNTER — Telehealth: Payer: Self-pay | Admitting: Registered Nurse

## 2023-03-24 NOTE — Telephone Encounter (Signed)
Return Kenneth Farrell call,  Due to his vacation schedule, July appointment will be My Chart visit. He verbalizes understanding.

## 2023-03-24 NOTE — Telephone Encounter (Signed)
Patient has a medical question to ask you.  Please call him.

## 2023-04-05 ENCOUNTER — Other Ambulatory Visit: Payer: Self-pay | Admitting: Registered Nurse

## 2023-04-07 NOTE — Telephone Encounter (Signed)
PMP was Reviewed.  Alprazolam e-scribed to pharmacy.  Call Placed to Kenneth Farrell regarding the above, he verbalizes understanding.

## 2023-04-18 ENCOUNTER — Encounter: Payer: Self-pay | Admitting: Registered Nurse

## 2023-04-18 ENCOUNTER — Encounter: Payer: Medicare Other | Attending: Registered Nurse | Admitting: Registered Nurse

## 2023-04-18 VITALS — BP 142/62 | HR 89 | Ht 67.0 in | Wt 260.0 lb

## 2023-04-18 DIAGNOSIS — M25561 Pain in right knee: Secondary | ICD-10-CM | POA: Insufficient documentation

## 2023-04-18 DIAGNOSIS — M47816 Spondylosis without myelopathy or radiculopathy, lumbar region: Secondary | ICD-10-CM | POA: Diagnosis not present

## 2023-04-18 DIAGNOSIS — G8929 Other chronic pain: Secondary | ICD-10-CM

## 2023-04-18 DIAGNOSIS — Q870 Congenital malformation syndromes predominantly affecting facial appearance: Secondary | ICD-10-CM

## 2023-04-18 DIAGNOSIS — M542 Cervicalgia: Secondary | ICD-10-CM | POA: Diagnosis not present

## 2023-04-18 DIAGNOSIS — M546 Pain in thoracic spine: Secondary | ICD-10-CM | POA: Insufficient documentation

## 2023-04-18 DIAGNOSIS — M5412 Radiculopathy, cervical region: Secondary | ICD-10-CM | POA: Diagnosis not present

## 2023-04-18 DIAGNOSIS — F411 Generalized anxiety disorder: Secondary | ICD-10-CM

## 2023-04-18 DIAGNOSIS — G894 Chronic pain syndrome: Secondary | ICD-10-CM

## 2023-04-18 DIAGNOSIS — M25562 Pain in left knee: Secondary | ICD-10-CM | POA: Insufficient documentation

## 2023-04-18 DIAGNOSIS — M7062 Trochanteric bursitis, left hip: Secondary | ICD-10-CM

## 2023-04-18 DIAGNOSIS — M5416 Radiculopathy, lumbar region: Secondary | ICD-10-CM

## 2023-04-18 DIAGNOSIS — M7061 Trochanteric bursitis, right hip: Secondary | ICD-10-CM

## 2023-04-18 MED ORDER — MORPHINE SULFATE ER 100 MG PO TBCR
100.0000 mg | EXTENDED_RELEASE_TABLET | Freq: Two times a day (BID) | ORAL | 0 refills | Status: DC
Start: 2023-04-18 — End: 2023-04-18

## 2023-04-18 MED ORDER — MORPHINE SULFATE ER 100 MG PO TBCR
100.0000 mg | EXTENDED_RELEASE_TABLET | Freq: Two times a day (BID) | ORAL | 0 refills | Status: DC
Start: 2023-04-18 — End: 2023-06-20

## 2023-04-18 NOTE — Progress Notes (Signed)
Subjective:    Patient ID: Kenneth Farrell, male    DOB: [birth year], 52 y.o.   MRN: [id number]  HPI: Kenneth Farrell is a 52 y.o. male who is scheduled for Video visit, due to his work scheduled. I connected with Kenneth Farrell on 04/18/2023, by a video enabled telemedicine application and verified that I am speaking with the correct person using two identifiers.  Location: Patient: In his Home Provider: In the office    I discussed the limitations, risks, security and privacy concerns of performing an evaluation and management service by telephone and the availability of in person appointments. I also discussed with the patient that there may be a patient responsible charge related to this service. The patient expressed understanding and agreed to proceed.  He states his pain is located in his neck radiating into his bilateral shoulders, mid- lower back pain, bilateral hip pain and bilateral knee pain. He rates his pain 8. His  current exercise regime is walking and performing stretching exercises.  [name] Morphine equivalent is 200.00 MME.He is also prescribed Alprazolam .We have discussed the black box warning of using opioids and benzodiazepines. I highlighted the dangers of using these drugs together and discussed the adverse events including respiratory suppression, overdose, cognitive impairment and importance of compliance with current regimen. We will continue to monitor and adjust as indicated.     Last UDS was Performed on 02/21/2023, it was consistent.    Pain Inventory Average Pain 7 Pain Right Now 8 My pain is sharp, burning, dull, stabbing, tingling, and aching  In the last 24 hours, has pain interfered with the following? General activity 5 Relation with others 5 Enjoyment of life 5 What TIME of day is your pain at its worst? morning , daytime, evening, and night Sleep (in general) Fair  Pain is worse with: walking, bending, sitting, inactivity, standing, and some  activites Pain improves with: rest, heat/ice, therapy/exercise, pacing activities, and medication Relief from Meds: 6  Family History  Problem Relation Age of Onset   Hypertension Mother    Hypertension Father    Social History   Socioeconomic History   Marital status: Married    Spouse name: Not on file   Number of children: Not on file   Years of education: Not on file   Highest education level: Not on file  Occupational History   Not on file  Tobacco Use   Smoking status: Former    Current packs/day: 0.00    Types: Cigarettes    Quit date: 06/27/2011    Years since quitting: 11.8   Smokeless tobacco: Never  Vaping Use   Vaping status: Never Used  Substance and Sexual Activity   Alcohol use: Not Currently   Drug use: Not Currently   Sexual activity: Not on file  Other Topics Concern   Not on file  Social History Narrative   Not on file   Social Determinants of Health   Financial Resource Strain: Unknown (12/22/2019)   Received from [facility], [facility]   Overall Financial Resource Strain (CARDIA)    Difficulty of Paying Living Expenses: Patient declined  Food Insecurity: No Food Insecurity (11/22/2021)   Received from [facility], [facility]   Hunger Vital Sign    Worried About Running Out of Food in the Last Year: Never true    Ran Out of Food in the Last Year: Never true  Transportation Needs: Unknown (12/22/2019)   Received from [facility],  [facility]   PRAPARE - Transportation    Lack of Transportation (Medical): Patient declined    Lack of Transportation (Non-Medical): Patient declined  Physical Activity: Unknown (12/22/2019)   Received from [facility], [facility]   Exercise Vital Sign    Days of Exercise per Week: Patient declined    Minutes of Exercise per Session: Not on file  Stress: Unknown (12/22/2019)   Received from [facility], [facility] of Occupational Health - Occupational Stress Questionnaire     Feeling of Stress : Patient declined  Social Connections: Unknown (02/05/2022)   Received from [facility], [facility]   Social Network    Social Network: Not on file   Past Surgical History:  Procedure Laterality Date   HERNIA REPAIR     SPINE SURGERY     urinary tract surgery     Past Surgical History:  Procedure Laterality Date   HERNIA REPAIR     SPINE SURGERY     urinary tract surgery     Past Medical History:  Diagnosis Date   Anxiety    Chronic pain syndrome    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       Bilateral shoulder pain Bilateral leg pain Bilateral knee pain   Neurological:  Positive for numbness.  All other systems reviewed and are negative.     Objective:   Physical Exam Vitals and nursing note reviewed.  Constitutional:      Appearance: Normal appearance.  Cardiovascular:     Rate and Rhythm: Normal rate and regular rhythm.     Pulses: Normal pulses.     Heart sounds: Normal heart sounds.  Pulmonary:     Effort: Pulmonary effort is normal.     Breath sounds: Normal breath sounds.  Musculoskeletal:     Cervical back: Normal range of motion and neck supple.     Comments: No Physical Exam : Virtual Visit    Neurological:     Mental Status: He is alert and oriented to person, place, and time.  Psychiatric:        Mood and Affect:  Mood normal.        Behavior: Behavior normal.         Assessment & Plan:  1. 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 04/18/2023 2. Lumbar Radiculitis/ Chronic Back Pain/ Lumbar Spondylosis: MS Contin is effective for pain management and makes a great difference in his quality of Life. Continue using Pamelor for neuropathy. Also recommended alternating  heat and ice for shoulder pain.  04/18/2023. Refilled: MS Contin 100mg  #60,---use one tablet every 12 hours. Second script sent for the following month. 04/18/2023 We will continue the opioid monitoring program, this consists of regular clinic visits, examinations, urine drug screen, pill counts as well as use of [place] Controlled Substance Reporting system. A 12 month History has been reviewed on the [place] Controlled Substance Reporting System on 04/18/2023. 3. Anxiety: Continue Xanax. Continue to Monitor.07/122024 4. Insomnia: Continue Ambien. Continue to Monitor. 04/18/2023. 5. Muscle Spasm: Continue Flexeril. Continue to monitor. 07/122024. 6. Cervicalgia: Cervical Radiculitis Continue HEP as tolerated. Continue to monitor. 04/18/2023 7. Bilateral Greater Trochanter Bursitis: Continue to Alternate Ice and Heat Therapy. Continue current medication. Continue to Monitor. 04/18/2023   F/U in 2 months Video Visit Established Patient Location of Patient: In his Home Location of Provider: In the Office

## 2023-05-23 ENCOUNTER — Other Ambulatory Visit: Payer: Self-pay | Admitting: Registered Nurse

## 2023-05-23 DIAGNOSIS — Q870 Congenital malformation syndromes predominantly affecting facial appearance: Secondary | ICD-10-CM

## 2023-05-23 NOTE — Telephone Encounter (Signed)
PMP was Reviewed.  Ambien e-scribed to pharmacy.  Kenneth Farrell is aware via My-Chart message.

## 2023-06-06 ENCOUNTER — Ambulatory Visit: Payer: Medicare Other | Admitting: Registered Nurse

## 2023-06-20 ENCOUNTER — Encounter: Payer: Medicare Other | Admitting: Registered Nurse

## 2023-06-20 ENCOUNTER — Other Ambulatory Visit: Payer: Self-pay | Admitting: Registered Nurse

## 2023-06-20 ENCOUNTER — Telehealth: Payer: Self-pay | Admitting: Registered Nurse

## 2023-06-20 DIAGNOSIS — Q870 Congenital malformation syndromes predominantly affecting facial appearance: Secondary | ICD-10-CM

## 2023-06-20 DIAGNOSIS — G894 Chronic pain syndrome: Secondary | ICD-10-CM

## 2023-06-20 DIAGNOSIS — M47816 Spondylosis without myelopathy or radiculopathy, lumbar region: Secondary | ICD-10-CM

## 2023-06-20 MED ORDER — MORPHINE SULFATE ER 100 MG PO TBCR
100.0000 mg | EXTENDED_RELEASE_TABLET | Freq: Two times a day (BID) | ORAL | 0 refills | Status: DC
Start: 2023-06-20 — End: 2023-07-11

## 2023-06-20 NOTE — Telephone Encounter (Signed)
Patient is sick and unable to come in today. Rescheduled appt for 07/11/23. Please send in medication refills.

## 2023-06-20 NOTE — Telephone Encounter (Signed)
PMP was Reviewed.  MC Contin e-scribed to pharmacy.  Kenneth Farrell is aware via My-Chart message.

## 2023-06-20 NOTE — Telephone Encounter (Signed)
PMP was Reviewed.  Ambien e-scribed to pharmacy.  Kenneth Farrell is aware via My-chart message.

## 2023-07-11 ENCOUNTER — Encounter: Payer: Medicare Other | Attending: Registered Nurse | Admitting: Registered Nurse

## 2023-07-11 ENCOUNTER — Encounter: Payer: Self-pay | Admitting: Registered Nurse

## 2023-07-11 VITALS — BP 145/89 | Ht 67.0 in | Wt 261.0 lb

## 2023-07-11 DIAGNOSIS — M25561 Pain in right knee: Secondary | ICD-10-CM

## 2023-07-11 DIAGNOSIS — M5416 Radiculopathy, lumbar region: Secondary | ICD-10-CM | POA: Diagnosis present

## 2023-07-11 DIAGNOSIS — M7061 Trochanteric bursitis, right hip: Secondary | ICD-10-CM

## 2023-07-11 DIAGNOSIS — M7062 Trochanteric bursitis, left hip: Secondary | ICD-10-CM

## 2023-07-11 DIAGNOSIS — F411 Generalized anxiety disorder: Secondary | ICD-10-CM | POA: Diagnosis present

## 2023-07-11 DIAGNOSIS — M47816 Spondylosis without myelopathy or radiculopathy, lumbar region: Secondary | ICD-10-CM | POA: Insufficient documentation

## 2023-07-11 DIAGNOSIS — G8929 Other chronic pain: Secondary | ICD-10-CM | POA: Diagnosis present

## 2023-07-11 DIAGNOSIS — Q870 Congenital malformation syndromes predominantly affecting facial appearance: Secondary | ICD-10-CM | POA: Diagnosis present

## 2023-07-11 DIAGNOSIS — M5412 Radiculopathy, cervical region: Secondary | ICD-10-CM | POA: Diagnosis present

## 2023-07-11 DIAGNOSIS — G894 Chronic pain syndrome: Secondary | ICD-10-CM

## 2023-07-11 DIAGNOSIS — M546 Pain in thoracic spine: Secondary | ICD-10-CM | POA: Diagnosis present

## 2023-07-11 DIAGNOSIS — Z5181 Encounter for therapeutic drug level monitoring: Secondary | ICD-10-CM

## 2023-07-11 DIAGNOSIS — M25562 Pain in left knee: Secondary | ICD-10-CM | POA: Insufficient documentation

## 2023-07-11 DIAGNOSIS — Z79891 Long term (current) use of opiate analgesic: Secondary | ICD-10-CM | POA: Diagnosis not present

## 2023-07-11 DIAGNOSIS — M542 Cervicalgia: Secondary | ICD-10-CM | POA: Diagnosis not present

## 2023-07-11 MED ORDER — MORPHINE SULFATE ER 100 MG PO TBCR: 100.0000 mg | EXTENDED_RELEASE_TABLET | Freq: Two times a day (BID) | ORAL | 0 refills | Status: DC

## 2023-07-11 MED ORDER — ALPRAZOLAM 1 MG PO TABS: ORAL_TABLET | ORAL | 3 refills | Status: DC

## 2023-07-11 NOTE — Progress Notes (Signed)
Subjective:    Patient ID: Kenneth Farrell, male    DOB: [birth year], 52 y.o.   MRN: [id number]  HPI: Kenneth Farrell is a 52 y.o. male who returns for follow up appointment for chronic pain and medication refill.He states his pain is located in his  neck radiating into his bilateral shoulders, mid- lower back pain radiating into his bilateral hips and bilateral lower extremities. He also reports bilateral knee pain. He rates his pain 9. His current exercise regime is walking and performing stretching exercises.  Ms. Morphine equivalent is 200.00 MME. HE is also prescribed alprazolam  .We have discussed the black box warning of using opioids and benzodiazepines. I highlighted the dangers of using these drugs together and discussed the adverse events including respiratory suppression, overdose, cognitive impairment and importance of compliance with current regimen. We will continue to monitor and adjust as indicated.   UDS ordered today.     Pain Inventory Average Pain 8 Pain Right Now 9 My pain is intermittent, constant, sharp, burning, dull, stabbing, tingling, and aching  In the last 24 hours, has pain interfered with the following? General activity 8 Relation with others 9 Enjoyment of life 8 What TIME of day is your pain at its worst? morning , daytime, evening, and night Sleep (in general) Fair  Pain is worse with: walking, bending, sitting, inactivity, standing, and some activites Pain improves with: rest, heat/ice, pacing activities, and medication Relief from Meds: 3  Family History  Problem Relation Age of Onset   Hypertension Mother    Hypertension Father    Social History   Socioeconomic History   Marital status: Married    Spouse name: Not on file   Number of children: Not on file   Years of education: Not on file   Highest education level: Not on file  Occupational History   Not on file  Tobacco Use   Smoking status: Former    Current packs/day: 0.00    Types:  Cigarettes    Quit date: 06/27/2011    Years since quitting: 12.0   Smokeless tobacco: Never  Vaping Use   Vaping status: Never Used  Substance and Sexual Activity   Alcohol use: Not Currently   Drug use: Not Currently   Sexual activity: Not on file  Other Topics Concern   Not on file  Social History Narrative   Not on file   Social Determinants of Health   Financial Resource Strain: Unknown (12/22/2019)   Received from [facility], [facility]   Overall Financial Resource Strain (CARDIA)    Difficulty of Paying Living Expenses: Patient declined  Food Insecurity: No Food Insecurity (11/22/2021)   Received from [facility], [facility]   Hunger Vital Sign    Worried About Running Out of Food in the Last Year: Never true    Ran Out of Food in the Last Year: Never true  Transportation Needs: Unknown (12/22/2019)   Received from [facility], [facility]   [facility] - Transportation    Lack of Transportation (Medical): Patient declined    Lack of Transportation (Non-Medical): Patient declined  Physical Activity: Unknown (12/22/2019)   Received from [facility], [facility]   Exercise Vital Sign    Days of Exercise per Week: Patient declined    Minutes of Exercise per Session: Not on file  Stress: Unknown (12/22/2019)   Received from [facility], [facility]   [employer] of Occupational Health - Occupational Stress Questionnaire  Feeling of Stress : Patient declined  Social Connections: Unknown (02/05/2022)   Received from [facility], [facility]   Social Network    Social Network: Not on file   Past Surgical History:  Procedure Laterality Date   HERNIA REPAIR     SPINE SURGERY     urinary tract surgery     Past Surgical History:  Procedure Laterality Date   HERNIA REPAIR     SPINE SURGERY     urinary tract surgery     Past Medical History:  Diagnosis Date   Anxiety    Chronic pain syndrome    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     Facet syndrome, lumbar    Kyphoscoliosis and scoliosis    Lumbago    Ht 5\' 7"  (1.702 m)   Wt 261 lb (118.4 kg)   BMI 40.88 kg/m   Opioid Risk Score:   Fall Risk Score:  `1  Depression screen [facility] 2/9     07/11/2023    2:55 PM 02/21/2023    2:08 PM 01/23/2023   12:36 PM 11/22/2022    2:29 PM 04/25/2022    8:39 AM 02/01/2022    2:37 PM 11/30/2021    1:29 PM  Depression screen PHQ 2/9  Decreased Interest 0 0 0 0 0 0 0  Down, Depressed, Hopeless 0 0 0 0  0 0  PHQ - 2 Score 0 0 0 0 0 0 0     Review of Systems  Musculoskeletal:  Positive for back pain, gait problem and neck pain.  All other systems reviewed and are negative.     Objective:   Physical Exam Vitals and nursing note reviewed.  Constitutional:      Appearance: Normal appearance. He is obese.  Cardiovascular:     Rate and Rhythm: Normal rate and regular rhythm.     Pulses: Normal pulses.     Heart sounds: Normal heart sounds.  Pulmonary:     Effort: Pulmonary effort is normal.     Breath sounds: Normal breath sounds.     Comments: Continuous Oxygen @ 3 liters nasal cannula  Musculoskeletal:     Cervical back: Normal range of motion and neck supple.     Comments: Normal Muscle Bulk and Muscle Testing Reveals:  Upper Extremities: Full ROM and Muscle Strength 5/5 Bilateral AC Joint Tenderness Thoracic Paraspinal Tenderness: T-7-T-9 Lumbar Paraspinal Tenderness: L-3-L-5 Lower Extremities: Full ROM and Muscle Strength 5/5 Arises from Table slowly Antalgic Gait     Skin:    General: Skin is warm and dry.  Neurological:     Mental Status: He is alert and oriented to person, place, and time.  Psychiatric:        Mood and Affect: Mood normal.        Behavior: Behavior normal.           Assessment & Plan:  1. 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 07/11/2023 2. Lumbar Radiculitis/ Chronic Back Pain/ Lumbar Spondylosis: MS Contin is effective for pain management and makes a great  difference in his quality of Life. Continue using Pamelor for neuropathy. Also recommended alternating heat and ice for shoulder pain.  07/11/2023. Refilled: MS Contin 100mg  #60,---use one tablet every 12 hours. Second script sent for the following month. 07/11/2023 We will continue the opioid monitoring program, this consists of regular clinic visits, examinations, urine drug screen, pill counts as well as use of [place] Controlled Substance Reporting system.  A 12 month History has been reviewed on the [place] Controlled Substance Reporting System on 07/11/2023. 3. Anxiety: Continue Xanax. Continue to Monitor.10/042024 4. Insomnia: Continue Ambien. Continue to Monitor. 07/11/2023. 5. Muscle Spasm: Continue Flexeril. Continue to monitor. 10/042024. 6. Cervicalgia: Cervical Radiculitis Continue HEP as tolerated. Continue to monitor. 07/11/2023 7. Bilateral Greater Trochanter Bursitis: Continue to Alternate Ice and Heat Therapy. Continue current medication. Continue to Monitor. 07/11/2023   F/U in 2 months

## 2023-07-16 LAB — TOXASSURE SELECT,+ANTIDEPR,UR

## 2023-07-17 ENCOUNTER — Telehealth: Payer: Self-pay | Admitting: Registered Nurse

## 2023-07-17 NOTE — Telephone Encounter (Signed)
UDS Results Reviewed.  +THC Call placed to Mr. Codispoti, no answer. Left message to return the call.

## 2023-07-17 NOTE — Telephone Encounter (Signed)
Mr. Petrich returned this provider call.  We discussed his UDS Results + THC, he states he has never smoked marijuana, he started new vitamin not sure if he has CBD or hemp products. He will check his bottle, and will not be using the product anymore. He will receive a Formal letter in the mail regarding the above, he verbalizes understanding.

## 2023-09-18 NOTE — Progress Notes (Signed)
Subjective:    Patient ID: Kenneth Farrell, male    DOB: [birth year], 52 y.o.   MRN: [id number]  HPI: Kenneth Farrell is a 52 y.o. male who is scheduled for Virtual visit, he called office on 09/18/2023, reporting car problems. His appointment was changed to a virtual visit. I connected with Kenneth Farrell by a video enabled telemedicine application and verified that I am speaking with the correct person using two identifiers.  Location: Patient: In his Home  Provider: In the Office    I discussed the limitations of evaluation and management by telemedicine and the availability of in person appointments. The patient expressed understanding and agreed to proceed.  He states his pain is located in his neck, mid- lower back radiating into his bilateral hips and bilateral lower extremities. He also reports bilateral knee pain. He rates his pain 8. His current exercise regime is walking and performing stretching exercises.  Kenneth Farrell Morphine equivalent is 200.00 MME.   Last UDS was Performed on 07/11/2023, see note for details.      Pain Inventory Average Pain 7 Pain Right Now 8 My pain is intermittent, constant, sharp, tingling, and throbbing and tightness  In the last 24 hours, has pain interfered with the following? General activity 8 Relation with others 8 Enjoyment of life 8 What TIME of day is your pain at its worst? morning , daytime, evening, and night Sleep (in general) Fair  Pain is worse with: walking, bending, sitting, inactivity, standing, and some activites Pain improves with: rest and medication Relief from Meds: 4  Family History  Problem Relation Age of Onset   Hypertension Mother    Hypertension Father    Social History   Socioeconomic History   Marital status: Married    Spouse name: Not on file   Number of children: Not on file   Years of education: Not on file   Highest education level: Not on file  Occupational History   Not on file  Tobacco Use   Smoking  status: Former    Current packs/day: 0.00    Types: Cigarettes    Quit date: 06/27/2011    Years since quitting: 12.2   Smokeless tobacco: Never  Vaping Use   Vaping status: Never Used  Substance and Sexual Activity   Alcohol use: Not Currently   Drug use: Not Currently   Sexual activity: Not on file  Other Topics Concern   Not on file  Social History Narrative   Not on file   Social Drivers of Health   Financial Resource Strain: Unknown (12/22/2019)   Received from [facility], [facility]   Overall Financial Resource Strain (CARDIA)    Difficulty of Paying Living Expenses: Patient declined  Food Insecurity: No Food Insecurity (11/22/2021)   Received from [facility], [facility]   Hunger Vital Sign    Worried About Running Out of Food in the Last Year: Never true    Ran Out of Food in the Last Year: Never true  Transportation Needs: Unknown (12/22/2019)   Received from [facility], [facility]   [facility] - Transportation    Lack of Transportation (Medical): Patient declined    Lack of Transportation (Non-Medical): Patient declined  Physical Activity: Unknown (12/22/2019)   Received from [facility], [facility]   Exercise Vital Sign    Days of Exercise per Week: Patient declined    Minutes of Exercise per Session: Not on file  Stress: Unknown (12/22/2019)   Received  from [facility], [facility]   [employer] of Occupational Health - Occupational Stress Questionnaire    Feeling of Stress : Patient declined  Social Connections: Unknown (02/05/2022)   Received from [facility], [facility]   Social Network    Social Network: Not on file   Past Surgical History:  Procedure Laterality Date   HERNIA REPAIR     SPINE SURGERY     urinary tract surgery     Past Surgical History:  Procedure Laterality Date   HERNIA REPAIR     SPINE SURGERY     urinary tract surgery     Past Medical History:  Diagnosis Date   Anxiety    Chronic pain syndrome     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  Opioid Risk Score:   Fall Risk Score:  `1  Depression screen [facility] 2/9     07/11/2023    2:55 PM 02/21/2023    2:08 PM 01/23/2023   12:36 PM 11/22/2022    2:29 PM 04/25/2022    8:39 AM 02/01/2022    2:37 PM 11/30/2021    1:29 PM  Depression screen PHQ 2/9  Decreased Interest 0 0 0 0 0 0 0  Down, Depressed, Hopeless 0 0 0 0  0 0  PHQ - 2 Score 0 0 0 0 0 0 0    Review of Systems  Musculoskeletal:  Positive for back pain and gait problem.  All other systems reviewed and are negative.     Objective:   Physical Exam Vitals and nursing note reviewed.  Musculoskeletal:     Comments: No Physical exam performed: Virtual Visit          Assessment & Plan:  1. 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 09/19/2023 2. Lumbar Radiculitis/ Chronic Back Pain/ Lumbar Spondylosis: MS Contin is effective for pain management and makes a great difference in his quality of Life. Continue using Pamelor for neuropathy. Also recommended alternating heat and ice for shoulder pain.  09/19/2023. Refilled: MS Contin 100mg  #60,---use one tablet every 12 hours. Second script sent for the following month. 09/19/2023 We will continue the opioid monitoring program, this consists of regular clinic visits, examinations, urine drug screen, pill counts as well as use of [place] Controlled Substance Reporting system. A 12 month History has been reviewed on the [place] Controlled Substance Reporting System on 09/19/2023. 3. Anxiety: Continue Xanax. Educated on [employer]. Continue to Monitor.12/132024 4. Insomnia: Continue Ambien. Continue to Monitor. 09/19/2023. 5. Muscle Spasm: Continue Flexeril. Continue to monitor. 12/132024. 6. Cervicalgia: Cervical Radiculitis Continue HEP as tolerated. Continue to monitor.  09/19/2023 7. Bilateral Greater Trochanter Bursitis: Continue to Alternate Ice and Heat Therapy. Continue current medication. Continue to Monitor. 09/19/2023  F/U in two months.  Established Patient Location of Patient: In His Home Location of Provider: In the Office  Video Visit  No Video Connection:  Used 100% Audio Connection    F/U in 2 month

## 2023-09-19 ENCOUNTER — Encounter: Payer: Medicare Other | Attending: Registered Nurse | Admitting: Registered Nurse

## 2023-09-19 ENCOUNTER — Encounter: Payer: Self-pay | Admitting: Registered Nurse

## 2023-09-19 VITALS — Ht 67.0 in | Wt 258.0 lb

## 2023-09-19 DIAGNOSIS — M7062 Trochanteric bursitis, left hip: Secondary | ICD-10-CM | POA: Diagnosis not present

## 2023-09-19 DIAGNOSIS — M5416 Radiculopathy, lumbar region: Secondary | ICD-10-CM

## 2023-09-19 DIAGNOSIS — M47816 Spondylosis without myelopathy or radiculopathy, lumbar region: Secondary | ICD-10-CM

## 2023-09-19 DIAGNOSIS — Z5181 Encounter for therapeutic drug level monitoring: Secondary | ICD-10-CM

## 2023-09-19 DIAGNOSIS — M546 Pain in thoracic spine: Secondary | ICD-10-CM | POA: Insufficient documentation

## 2023-09-19 DIAGNOSIS — M542 Cervicalgia: Secondary | ICD-10-CM

## 2023-09-19 DIAGNOSIS — M7061 Trochanteric bursitis, right hip: Secondary | ICD-10-CM

## 2023-09-19 DIAGNOSIS — G894 Chronic pain syndrome: Secondary | ICD-10-CM

## 2023-09-19 DIAGNOSIS — Z79891 Long term (current) use of opiate analgesic: Secondary | ICD-10-CM

## 2023-09-19 DIAGNOSIS — G8929 Other chronic pain: Secondary | ICD-10-CM

## 2023-09-19 DIAGNOSIS — Q870 Congenital malformation syndromes predominantly affecting facial appearance: Secondary | ICD-10-CM

## 2023-09-19 MED ORDER — MORPHINE SULFATE ER 100 MG PO TBCR
100.0000 mg | EXTENDED_RELEASE_TABLET | Freq: Two times a day (BID) | ORAL | 0 refills | Status: DC
Start: 2023-09-19 — End: 2023-09-19

## 2023-09-19 MED ORDER — MORPHINE SULFATE ER 100 MG PO TBCR: 100.0000 mg | EXTENDED_RELEASE_TABLET | Freq: Two times a day (BID) | ORAL | 0 refills | Status: DC

## 2023-10-20 ENCOUNTER — Other Ambulatory Visit: Payer: Self-pay | Admitting: Registered Nurse

## 2023-10-20 ENCOUNTER — Telehealth: Payer: Self-pay | Admitting: Registered Nurse

## 2023-10-20 DIAGNOSIS — Q870 Congenital malformation syndromes predominantly affecting facial appearance: Secondary | ICD-10-CM

## 2023-10-20 MED ORDER — ZOLPIDEM TARTRATE 10 MG PO TABS
10.0000 mg | ORAL_TABLET | Freq: Every evening | ORAL | 3 refills | Status: DC | PRN
Start: 2023-10-20 — End: 2023-12-19

## 2023-10-20 NOTE — Telephone Encounter (Signed)
PMP was Reviewed.  Ambien e-scribed to pharmacy.  Kenneth Farrell is aware via My-Chart message.

## 2023-11-14 ENCOUNTER — Encounter: Payer: Medicare Other | Admitting: Registered Nurse

## 2023-11-21 ENCOUNTER — Telehealth: Payer: Self-pay | Admitting: Registered Nurse

## 2023-11-21 ENCOUNTER — Encounter: Payer: Medicare Other | Admitting: Registered Nurse

## 2023-11-21 DIAGNOSIS — Q870 Congenital malformation syndromes predominantly affecting facial appearance: Secondary | ICD-10-CM

## 2023-11-21 DIAGNOSIS — M47816 Spondylosis without myelopathy or radiculopathy, lumbar region: Secondary | ICD-10-CM

## 2023-11-21 DIAGNOSIS — G894 Chronic pain syndrome: Secondary | ICD-10-CM

## 2023-11-21 MED ORDER — MORPHINE SULFATE ER 100 MG PO TBCR: 100.0000 mg | EXTENDED_RELEASE_TABLET | Freq: Two times a day (BID) | ORAL | 0 refills | Status: DC

## 2023-11-21 NOTE — Progress Notes (Deleted)
 Subjective:    Patient ID: Kenneth Farrell, male    DOB: 05/08/1971, 53 y.o.   MRN: [id number]  HPI  Pain Inventory Average Pain {NUMBERS; 0-10:5044} Pain Right Now {NUMBERS; 0-10:5044} My pain is {PAIN DESCRIPTION:21022940}  In the last 24 hours, has pain interfered with the following? General activity {NUMBERS; 0-10:5044} Relation with others {NUMBERS; 0-10:5044} Enjoyment of life {NUMBERS; 0-10:5044} What TIME of day is your pain at its worst? {time of day:24191} Sleep (in general) {BHH GOOD/FAIR/POOR:22877}  Pain is worse with: {ACTIVITIES:21022942} Pain improves with: {PAIN IMPROVES WUJW:11914782} Relief from Meds: {NUMBERS; 0-10:5044}  Family History  Problem Relation Age of Onset   Hypertension Mother    Hypertension Father    Social History   Socioeconomic History   Marital status: Married    Spouse name: Not on file   Number of children: Not on file   Years of education: Not on file   Highest education level: Not on file  Occupational History   Not on file  Tobacco Use   Smoking status: Former    Current packs/day: 0.00    Types: Cigarettes    Quit date: 06/27/2011    Years since quitting: 12.4   Smokeless tobacco: Never  Vaping Use   Vaping status: Never Used  Substance and Sexual Activity   Alcohol use: Not Currently   Drug use: Not Currently   Sexual activity: Not on file  Other Topics Concern   Not on file  Social History Narrative   Not on file   Social Drivers of Health   Financial Resource Strain: Patient Declined (09/29/2023)   Received [facility]   Overall Financial Resource Strain (CARDIA)    Difficulty of Paying Living Expenses: Patient declined  Food Insecurity: No Food Insecurity (09/29/2023)   Received from [facility]   Hunger Vital Sign    Worried About Running Out of Food in the Last Year: Never true    Ran Out of Food in the Last Year: Never true  Transportation Needs: Patient Declined (09/29/2023)   Received from  [facility] - Transportation    Lack of Transportation (Medical): Patient declined    Lack of Transportation (Non-Medical): Patient declined  Physical Activity: Unknown (12/22/2019)   Received from [facility], [facility]   Exercise Vital Sign    Days of Exercise per Week: Patient declined    Minutes of Exercise per Session: Not on file  Stress: Unknown (12/22/2019)   Received from [facility], [facility]   [employer] of Occupational Health - Occupational Stress Questionnaire    Feeling of Stress : Patient declined  Social Connections: Unknown (02/05/2022)   Received from [employer], [facility]   Social Network    Social Network: Not on file   Past Surgical History:  Procedure Laterality Date   HERNIA REPAIR     SPINE SURGERY     urinary tract surgery     Past Surgical History:  Procedure Laterality Date   HERNIA REPAIR     SPINE SURGERY     urinary tract surgery     Past Medical History:  Diagnosis Date   Anxiety    Chronic pain syndrome    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  Opioid Risk Score:   Fall Risk Score:  `1  Depression  screen PHQ 2/9     09/19/2023    2:36 PM 07/11/2023    2:55 PM 02/21/2023    2:08 PM 01/23/2023   12:36 PM 11/22/2022    2:29 PM 04/25/2022    8:39 AM 02/01/2022    2:37 PM  Depression screen PHQ 2/9  Decreased Interest 0 0 0 0 0 0 0  Down, Depressed, Hopeless 0 0 0 0 0  0  PHQ - 2 Score 0 0 0 0 0 0 0     Review of Systems     Objective:   Physical Exam        Assessment & Plan:

## 2023-11-21 NOTE — Telephone Encounter (Signed)
Call placed to [name], he states he will be going to the emergency room when his wife gets home. He was instructed to go to the Emergency Room to be evaluated.  He also reports three week ago , his left hand went  numb, confusion and unable to read the words on his Television. Also statetes he was having headaches. He went to PCP, she referred to ophthalmologist and now he has an appointment with neurologist next month.  Right now he denies any tingling in his upper extremities, he reports nausea, and abdomen queasy. He was instructed to go to the emergency room to be evaluated, he states he states he will go to the emergency room.

## 2023-11-21 NOTE — Telephone Encounter (Signed)
Patient is having numbness and tingling in arm and his BP is up. He is trying to see if his neighbor can take him to hospital.  Would like for [facility] to call him.  He will need his medication.

## 2023-12-03 ENCOUNTER — Other Ambulatory Visit: Payer: Self-pay | Admitting: Registered Nurse

## 2023-12-04 ENCOUNTER — Telehealth: Payer: Self-pay | Admitting: Registered Nurse

## 2023-12-04 MED ORDER — ALPRAZOLAM 1 MG PO TABS: ORAL_TABLET | ORAL | 3 refills | Status: DC

## 2023-12-04 NOTE — Telephone Encounter (Signed)
 PMP was Reviewed.  Alprazolam e-scribed to pharmacy.  [name] is aware via My-Chart message.

## 2023-12-04 NOTE — Telephone Encounter (Signed)
 Patient is requesting refill on alprazolam. His next appointment is 12/19/23

## 2023-12-04 NOTE — Telephone Encounter (Signed)
 PMP was Reviewed .  Call placed to [facility] to Cancel Alprazolam prescription.  Kenneth Farrell wants Alprazolam at [facility].  This provider spoke with pharmacist.

## 2023-12-09 ENCOUNTER — Other Ambulatory Visit: Payer: Self-pay

## 2023-12-18 NOTE — Progress Notes (Signed)
 Subjective:    Patient ID: IDUS Kenneth Farrell, male    DOB: August 24, 1971, 52 y.o.   MRN: [id number]  HPI: Kenneth Farrell is a 53 y.o. male who returns for follow up appointment for chronic pain and medication refill. He states his  pain is located in his neck  radiating into his bilateral shoulders, mid- lower back pain radiating into his bilateral hips and bilateral lower extremities. Also reports bilateral knee pain. He rates his pain 8. His current exercise regime is walking short distances  and performing stretching exercises.  [name] Morphine equivalent is 200.00 MME. He is also prescribed Alprazolam .We have discussed the black box warning of using opioids and benzodiazepines. I highlighted the dangers of using these drugs together and discussed the adverse events including respiratory suppression, overdose, cognitive impairment and importance of compliance with current regimen. We will continue to monitor and adjust as indicated.      Oral Swab was Performed today.    Pain Inventory Average Pain 8 Pain Right Now 8 My pain is intermittent, constant, sharp, burning, dull, stabbing, tingling, and aching  In the last 24 hours, has pain interfered with the following? General activity 8 Relation with others 8 Enjoyment of life 8 What TIME of day is your pain at its worst? morning , daytime, evening, and night Sleep (in general) Fair  Pain is worse with: walking, bending, sitting, inactivity, standing, and some activites Pain improves with: rest, heat/ice, therapy/exercise, pacing activities, and medication Relief from Meds: 3  Family History  Problem Relation Age of Onset   Hypertension Mother    Hypertension Father    Social History   Socioeconomic History   Marital status: Married    Spouse name: Not on file   Number of children: Not on file   Years of education: Not on file   Highest education level: Not on file  Occupational History   Not on file  Tobacco Use   Smoking  status: Former    Current packs/day: 0.00    Types: Cigarettes    Quit date: 06/27/2011    Years since quitting: 12.4   Smokeless tobacco: Never  Vaping Use   Vaping status: Never Used  Substance and Sexual Activity   Alcohol use: Not Currently   Drug use: Not Currently   Sexual activity: Not on file  Other Topics Concern   Not on file  Social History Narrative   Not on file   Social Drivers of Health   Financial Resource Strain: Patient Declined (12/15/2023)   Overall Financial Resource Strain (CARDIA)    Difficulty of Paying Living Expenses: Patient declined  Food Insecurity: Patient Declined (12/15/2023)   Hunger Vital Sign    Worried About Running Out of Food in the Last Year: Patient declined    Ran Out of Food in the Last Year: Patient declined  Transportation Needs: Patient Declined (12/15/2023)   PRAPARE - Administrator, Civil Service (Medical): Patient declined    Lack of Transportation (Non-Medical): Patient declined  Physical Activity: Patient Declined (12/15/2023)   Exercise Vital Sign    Days of Exercise per Week: Patient declined    Minutes of Exercise per Session: Patient declined  Recent Concern: Physical Activity - Insufficiently Active (12/03/2023)   Exercise Vital Sign    Days of Exercise per Week: 2 days    Minutes of Exercise per Session: 10 min  Stress: Patient Declined (12/15/2023)   [employer] of Occupational Health - Occupational  Stress Questionnaire    Feeling of Stress : Patient declined  Social Connections: Patient Declined (12/15/2023)   Social Network    How would you rate your social network (family, work, friends)?: Patient declined   Past Surgical History:  Procedure Laterality Date   HERNIA REPAIR     SPINE SURGERY     urinary tract surgery     Past Surgical History:  Procedure Laterality Date   HERNIA REPAIR     SPINE SURGERY     urinary tract surgery     Past Medical History:  Diagnosis Date   Anxiety     Chronic pain syndrome    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  Opioid Risk Score:   Fall Risk Score:  `1  Depression screen [facility] 2/9     09/19/2023    2:36 PM 07/11/2023    2:55 PM 02/21/2023    2:08 PM 01/23/2023   12:36 PM 11/22/2022    2:29 PM 04/25/2022    8:39 AM 02/01/2022    2:37 PM  Depression screen PHQ 2/9  Decreased Interest 0 0 0 0 0 0 0  Down, Depressed, Hopeless 0 0 0 0 0  0  PHQ - 2 Score 0 0 0 0 0 0 0    Review of Systems  Musculoskeletal:  Positive for back pain, gait problem and neck pain.       Pain in both knees, pain in both hips  All other systems reviewed and are negative.      Objective:   Physical Exam Vitals and nursing note reviewed.  Constitutional:      Appearance: Normal appearance. He is obese.  Neck:     Comments: Cervical Paraspinal Tenderness: C-5-C-6 Cardiovascular:     Rate and Rhythm: Normal rate and regular rhythm.     Pulses: Normal pulses.     Heart sounds: Normal heart sounds.  Pulmonary:     Effort: Pulmonary effort is normal.     Breath sounds: Normal breath sounds.     Comments: Continuous Oxygen @ 3 liters nasal Cannula  Musculoskeletal:     Comments: Normal Muscle Bulk and Muscle Testing Reveals:  Upper Extremities: Full ROM and Muscle Strength 5/5 Bilateral AC Joint Tenderness  Lumbar  Paraspinal Tenderness: L-3-L-5 Bilateral Greater Trochanter Tenderness Lower Extremities: Full ROM and Muscle Strength 5/5 Arises from Table slowly  Narrow Based  Gait     Skin:    General: Skin is warm and dry.  Neurological:     Mental Status: He is alert and oriented to person, place, and time.  Psychiatric:        Mood and Affect: Mood normal.        Behavior: Behavior normal.         Assessment & Plan:  1. 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 12/19/2023 2. Lumbar  Radiculitis/ Chronic Back Pain/ Lumbar Spondylosis: MS Contin is effective for pain management and makes a great difference in his quality of Life. Continue using Pamelor for neuropathy. Also recommended alternating heat and ice for shoulder pain.  12/19/2023. Refilled: MS Contin 100mg  #60,---use one tablet every 12 hours. Second script sent for the following month. 12/19/2023 We will continue the opioid monitoring program, this consists of regular clinic visits, examinations, urine drug screen, pill counts as well  as use of [place] Controlled Substance Reporting system. A 12 month History has been reviewed on the [place] Controlled Substance Reporting System on 12/19/2023. 3. Anxiety: Continue Xanax. Educated on [employer]. Continue to Monitor.03/142025 4. Insomnia: Continue Ambien. Continue to Monitor. 12/19/2023. 5. Muscle Spasm: Continue Flexeril. Continue to monitor. 12/19/2023 6. Cervicalgia: Cervical Radiculitis Continue HEP as tolerated. Continue to monitor. 12/19/2023 7. Bilateral Greater Trochanter Bursitis: Continue to Alternate Ice and Heat Therapy. Continue current medication. Continue to Monitor. 12/19/2023    F/U in 2 month

## 2023-12-19 ENCOUNTER — Encounter: Payer: Self-pay | Admitting: Registered Nurse

## 2023-12-19 ENCOUNTER — Encounter: Payer: Medicare Other | Attending: Registered Nurse | Admitting: Registered Nurse

## 2023-12-19 VITALS — BP 151/94 | HR 89 | Ht 67.0 in | Wt 262.0 lb

## 2023-12-19 DIAGNOSIS — M7061 Trochanteric bursitis, right hip: Secondary | ICD-10-CM | POA: Insufficient documentation

## 2023-12-19 DIAGNOSIS — G894 Chronic pain syndrome: Secondary | ICD-10-CM | POA: Diagnosis present

## 2023-12-19 DIAGNOSIS — M47816 Spondylosis without myelopathy or radiculopathy, lumbar region: Secondary | ICD-10-CM | POA: Diagnosis present

## 2023-12-19 DIAGNOSIS — G4700 Insomnia, unspecified: Secondary | ICD-10-CM | POA: Diagnosis present

## 2023-12-19 DIAGNOSIS — G8929 Other chronic pain: Secondary | ICD-10-CM | POA: Diagnosis present

## 2023-12-19 DIAGNOSIS — M7062 Trochanteric bursitis, left hip: Secondary | ICD-10-CM | POA: Diagnosis present

## 2023-12-19 DIAGNOSIS — M542 Cervicalgia: Secondary | ICD-10-CM | POA: Insufficient documentation

## 2023-12-19 DIAGNOSIS — M5416 Radiculopathy, lumbar region: Secondary | ICD-10-CM | POA: Diagnosis present

## 2023-12-19 DIAGNOSIS — Z79891 Long term (current) use of opiate analgesic: Secondary | ICD-10-CM | POA: Insufficient documentation

## 2023-12-19 DIAGNOSIS — M5412 Radiculopathy, cervical region: Secondary | ICD-10-CM | POA: Diagnosis present

## 2023-12-19 DIAGNOSIS — M546 Pain in thoracic spine: Secondary | ICD-10-CM | POA: Diagnosis present

## 2023-12-19 DIAGNOSIS — Z5181 Encounter for therapeutic drug level monitoring: Secondary | ICD-10-CM | POA: Diagnosis present

## 2023-12-19 DIAGNOSIS — Q870 Congenital malformation syndromes predominantly affecting facial appearance: Secondary | ICD-10-CM | POA: Insufficient documentation

## 2023-12-19 MED ORDER — MORPHINE SULFATE ER 100 MG PO TBCR: 100.0000 mg | EXTENDED_RELEASE_TABLET | Freq: Two times a day (BID) | ORAL | 0 refills | Status: DC

## 2024-01-16 ENCOUNTER — Other Ambulatory Visit: Payer: Self-pay | Admitting: Registered Nurse

## 2024-02-13 ENCOUNTER — Encounter: Attending: Registered Nurse | Admitting: Registered Nurse

## 2024-02-13 ENCOUNTER — Encounter: Payer: Self-pay | Admitting: Registered Nurse

## 2024-02-13 VITALS — BP 148/89 | HR 93 | Ht 67.0 in | Wt 266.4 lb

## 2024-02-13 DIAGNOSIS — G894 Chronic pain syndrome: Secondary | ICD-10-CM

## 2024-02-13 DIAGNOSIS — M7061 Trochanteric bursitis, right hip: Secondary | ICD-10-CM

## 2024-02-13 DIAGNOSIS — M542 Cervicalgia: Secondary | ICD-10-CM

## 2024-02-13 DIAGNOSIS — M47816 Spondylosis without myelopathy or radiculopathy, lumbar region: Secondary | ICD-10-CM | POA: Diagnosis present

## 2024-02-13 DIAGNOSIS — G8929 Other chronic pain: Secondary | ICD-10-CM | POA: Insufficient documentation

## 2024-02-13 DIAGNOSIS — M5412 Radiculopathy, cervical region: Secondary | ICD-10-CM

## 2024-02-13 DIAGNOSIS — Z5181 Encounter for therapeutic drug level monitoring: Secondary | ICD-10-CM | POA: Diagnosis present

## 2024-02-13 DIAGNOSIS — M7062 Trochanteric bursitis, left hip: Secondary | ICD-10-CM | POA: Diagnosis present

## 2024-02-13 DIAGNOSIS — Q870 Congenital malformation syndromes predominantly affecting facial appearance: Secondary | ICD-10-CM | POA: Diagnosis present

## 2024-02-13 DIAGNOSIS — M546 Pain in thoracic spine: Secondary | ICD-10-CM | POA: Diagnosis not present

## 2024-02-13 DIAGNOSIS — M5416 Radiculopathy, lumbar region: Secondary | ICD-10-CM | POA: Diagnosis present

## 2024-02-13 DIAGNOSIS — Z79891 Long term (current) use of opiate analgesic: Secondary | ICD-10-CM | POA: Diagnosis present

## 2024-02-13 MED ORDER — MORPHINE SULFATE ER 100 MG PO TBCR
100.0000 mg | EXTENDED_RELEASE_TABLET | Freq: Two times a day (BID) | ORAL | 0 refills | Status: DC
Start: 2024-02-13 — End: 2024-04-15

## 2024-02-13 MED ORDER — MORPHINE SULFATE ER 100 MG PO TBCR: 100.0000 mg | EXTENDED_RELEASE_TABLET | Freq: Two times a day (BID) | ORAL | 0 refills | Status: DC

## 2024-02-13 MED ORDER — ALPRAZOLAM 1 MG PO TABS: ORAL_TABLET | ORAL | 3 refills | Status: DC

## 2024-02-13 NOTE — Progress Notes (Deleted)
 Pain Inventory Average Pain 8 Pain Right Now 8 My pain is intermittent, constant, sharp, burning, dull, stabbing, tingling, and aching  In the last 24 hours, has pain interfered with the following? General activity 8 Relation with others 8 Enjoyment of life 8 What TIME of day is your pain at its worst? morning , daytime, evening, and night Sleep (in general) Fair  Pain is worse with: walking, bending, sitting, inactivity, standing, unsure, and some activites Pain improves with: rest, heat/ice, therapy/exercise, pacing activities, and medication Relief from Meds: 4  Family History  Problem Relation Age of Onset   Hypertension Mother    Hypertension Father    Social History   Socioeconomic History   Marital status: Married    Spouse name: Not on file   Number of children: Not on file   Years of education: Not on file   Highest education level: Not on file  Occupational History   Not on file  Tobacco Use   Smoking status: Former    Current packs/day: 0.00    Types: Cigarettes    Quit date: 06/27/2011    Years since quitting: 12.6   Smokeless tobacco: Never  Vaping Use   Vaping status: Never Used  Substance and Sexual Activity   Alcohol  use: Not Currently   Drug use: Not Currently   Sexual activity: Not on file  Other Topics Concern   Not on file  Social History Narrative   Not on file   Social Drivers of Health   Financial Resource Strain: Patient Declined (12/15/2023)   Received from [facility]   Overall Financial Resource Strain (CARDIA)    Difficulty of Paying Living Expenses: Patient declined  Food Insecurity: Patient Declined (12/15/2023)   Received from [facility]   Hunger Vital Sign    Worried About Running Out of Food in the Last Year: Patient declined    Ran Out of Food in the Last Year: Patient declined  Transportation Needs: Patient Declined (12/15/2023)   Received from [facility] - Transportation    Lack of Transportation  (Medical): Patient declined    Lack of Transportation (Non-Medical): Patient declined  Physical Activity: Patient Declined (12/15/2023)   Received from [facility]   Exercise Vital Sign    Days of Exercise per Week: Patient declined    Minutes of Exercise per Session: Patient declined  Recent Concern: Physical Activity - Insufficiently Active (12/03/2023)   Received from [facility]   Exercise Vital Sign    Days of Exercise per Week: 2 days    Minutes of Exercise per Session: 10 min  Stress: Patient Declined (12/15/2023)   Received from [facility] of Occupational Health - Occupational Stress Questionnaire    Feeling of Stress : Patient declined  Social Connections: Patient Declined (12/15/2023)   Received from [facility]   Social Network    How would you rate your social network (family, work, friends)?: Patient declined   Past Surgical History:  Procedure Laterality Date   HERNIA REPAIR     SPINE SURGERY     urinary tract surgery     Past Surgical History:  Procedure Laterality Date   HERNIA REPAIR     SPINE SURGERY     urinary tract surgery     Past Medical History:  Diagnosis Date   Anxiety    Chronic pain syndrome    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    Facet syndrome, lumbar  Kyphoscoliosis and scoliosis    Lumbago    BP (!) 148/89 (BP Location: Left Arm, Patient Position: Sitting, Cuff Size: Large)   Pulse 93   Ht 5\' 7"  (1.702 m)   Wt 266 lb 6.4 oz (120.8 kg)   SpO2 97%   BMI 41.72 kg/m   Opioid Risk Score:   Fall Risk Score:  `1  Depression screen PHQ 2/9     02/13/2024    2:10 PM 12/19/2023    2:39 PM 09/19/2023    2:36 PM 07/11/2023    2:55 PM 02/21/2023    2:08 PM 01/23/2023   12:36 PM 11/22/2022    2:29 PM  Depression screen PHQ 2/9  Decreased Interest 0 0 0 0 0 0 0  Down, Depressed, Hopeless 0 0 0 0 0 0 0  PHQ - 2 Score 0 0 0 0 0 0 0     Subjective:    Patient ID: Kenneth Farrell, male    DOB:  1971-08-11, 53 y.o.   MRN: [id number]  HPI    Review of Systems  Musculoskeletal:  Positive for arthralgias, back pain and myalgias.       Bilateral leg pain, knee and shoulder pain  All other systems reviewed and are negative.      Objective:   Physical Exam        Assessment & Plan:

## 2024-02-13 NOTE — Progress Notes (Addendum)
 Subjective:    Patient ID: Kenneth Farrell, male    DOB: 20-Jun-1971, 53 y.o.   MRN: [id number]  HPI: Kenneth Farrell is a 53 y.o. male who returns for follow up appointment for chronic pain and medication refill. He states his  pain is located in his neck radiating into his bilateral shoulders, mid- lower back radiating into his bilateral hips and bilateral lower extremities. Also reports bilateral knee pain. He rates his pain 8. His current exercise regime is walking and performing stretching exercises.  Mr. Chiari Morphine  equivalent is 200.00 MME.He  is also prescribed Alprazolam  .We have discussed the black box warning of using opioids and benzodiazepines. I highlighted the dangers of using these drugs together and discussed the adverse events including respiratory suppression, overdose, cognitive impairment and importance of compliance with current regimen. We will continue to monitor and adjust as indicated.   Pain Inventory Average Pain 8 Pain Right Now 8 My pain is intermittent, constant, sharp, burning, dull, stabbing, tingling, and aching  In the last 24 hours, has pain interfered with the following? General activity 8 Relation with others 8 Enjoyment of life 8 What TIME of day is your pain at its worst? morning , daytime, evening, and night Sleep (in general) Fair  Pain is worse with: walking, bending, sitting, inactivity, standing, unsure, and some activites Pain improves with: rest, heat/ice, therapy/exercise, pacing activities, and medication Relief from Meds: 4  Family History  Problem Relation Age of Onset  . Hypertension Mother   . Hypertension Father    Social History   Socioeconomic History  . Marital status: Married    Spouse name: Not on file  . Number of children: Not on file  . Years of education: Not on file  . Highest education level: Not on file  Occupational History  . Not on file  Tobacco Use  . Smoking status: Former    Current packs/day: 0.00     Types: Cigarettes    Quit date: 06/27/2011    Years since quitting: 12.6  . Smokeless tobacco: Never  Vaping Use  . Vaping status: Never Used  Substance and Sexual Activity  . Alcohol  use: Not Currently  . Drug use: Not Currently  . Sexual activity: Not on file  Other Topics Concern  . Not on file  Social History Narrative  . Not on file   Social Drivers of Health   Financial Resource Strain: Patient Declined (12/15/2023)   Received from [facility]   Overall Financial Resource Strain (CARDIA)   . Difficulty of Paying Living Expenses: Patient declined  Food Insecurity: Patient Declined (12/15/2023)   Received from [facility]   Hunger Vital Sign   . Worried About Programme researcher, broadcasting/film/video in the Last Year: Patient declined   . Ran Out of Food in the Last Year: Patient declined  Transportation Needs: Patient Declined (12/15/2023)   Received from [facility] - Transportation   . Lack of Transportation (Medical): Patient declined   . Lack of Transportation (Non-Medical): Patient declined  Physical Activity: Patient Declined (12/15/2023)   Received from [facility]   Exercise Vital Sign   . Days of Exercise per Week: Patient declined   . Minutes of Exercise per Session: Patient declined  Recent Concern: Physical Activity - Insufficiently Active (12/03/2023)   Received from [facility]   Exercise Vital Sign   . Days of Exercise per Week: 2 days   . Minutes of Exercise per Session: 10  min  Stress: Patient Declined (12/15/2023)   Received from [facility] - Occupational Stress Questionnaire   . Feeling of Stress : Patient declined  Social Connections: Patient Declined (12/15/2023)   Received from [facility]   Social Network   . How would you rate your social network (family, work, friends)?: Patient declined   Past Surgical History:  Procedure Laterality Date  . HERNIA REPAIR    . SPINE SURGERY    . urinary tract  surgery     Past Surgical History:  Procedure Laterality Date  . HERNIA REPAIR    . SPINE SURGERY    . urinary tract surgery     Past Medical History:  Diagnosis Date  . Anxiety   . Chronic pain syndrome   . 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 Facet syndrome, lumbar   . Kyphoscoliosis and scoliosis   . Lumbago    BP (!) 148/89 (BP Location: Left Arm, Patient Position: Sitting, Cuff Size: Large)   Pulse 93   Ht 5\' 7"  (1.702 m)   Wt 266 lb 6.4 oz (120.8 kg)   SpO2 97%   BMI 41.72 kg/m   Opioid Risk Score:   Fall Risk Score:  `1  Depression screen PHQ 2/9     02/13/2024    2:10 PM 12/19/2023    2:39 PM 09/19/2023    2:36 PM 07/11/2023    2:55 PM 02/21/2023    2:08 PM 01/23/2023   12:36 PM 11/22/2022    2:29 PM  Depression screen PHQ 2/9  Decreased Interest 0 0 0 0 0 0 0  Down, Depressed, Hopeless 0 0 0 0 0 0 0  PHQ - 2 Score 0 0 0 0 0 0 0      Review of Systems  Musculoskeletal:  Positive for arthralgias, back pain and myalgias.       Bilateral shoulder and knee pain, back pain  All other systems reviewed and are negative.      Objective:   Physical Exam Vitals and nursing note reviewed.  Constitutional:      Appearance: Normal appearance. He is obese.  Neck:     Comments: Cervical Paraspinal Tenderness: C-5-C-6  Cardiovascular:     Rate and Rhythm: Normal rate and regular rhythm.     Pulses: Normal pulses.     Heart sounds: Normal heart sounds.  Pulmonary:     Effort: Pulmonary effort is normal.     Breath sounds: Normal breath sounds.     Comments: Continuous Oxygen at 3 Liters Nasal Cannula  Musculoskeletal:     Comments: Normal Muscle Bulk and Muscle Testing Reveals:  Upper Extremities:Full  ROM and Muscle Strength 5/5 Bilateral AC Joint Tenderness Lumbar Paraspinal Tenderness: l-4-l-5 Lower Extremities: Full ROM and Muscle Strength 5/5 Arises from Table Slowly  Narrow Based 5/5  Gait     Skin:    General: Skin is warm and dry.   Neurological:     Mental Status: He is alert and oriented to person, place, and time.  Psychiatric:        Mood and Affect: Mood normal.        Behavior: Behavior normal.         Assessment & Plan:  1. 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 02/13/2024 2. Lumbar Radiculitis/ Chronic Back Pain/ Lumbar Spondylosis: MS Contin  is effective for pain management and makes a great difference in his quality  of Life. Continue using Pamelor  for neuropathy. Also recommended alternating heat and ice for shoulder pain.  02/13/2024. Refilled: MS Contin  100mg  #60,---use one tablet every 12 hours. Second script sent for the following month. 02/13/2024 We will continue the opioid monitoring program, this consists of regular clinic visits, examinations, urine drug screen, pill counts as well as use of [place]  Controlled Substance Reporting system. A 12 month History has been reviewed on the [place]  Controlled Substance Reporting System on 02/13/2024. 3. Anxiety: Continue Xanax . Educated on [employer]. Continue to Monitor.[id number] 4. Insomnia: Continue Ambien . Continue to Monitor. 02/13/2024. 5. Muscle Spasm: Continue Flexeril . Continue to monitor. 02/13/2024 6. Cervicalgia: Cervical Radiculitis Continue HEP as tolerated. Continue to monitor. 02/13/2024 7. Bilateral Greater Trochanter Bursitis: Continue to Alternate Ice and Heat Therapy. Continue current medication. Continue to Monitor. 02/13/2024     F/U in 2 month

## 2024-02-13 NOTE — Progress Notes (Deleted)
 Subjective:    Patient ID: Kenneth Farrell, male    DOB: Oct 16, 1970, 53 y.o.   MRN: [id number]  HPI Pain Inventory Average Pain 8 Pain Right Now 8 My pain is intermittent, constant, sharp, burning, dull, stabbing, tingling, and aching  In the last 24 hours, has pain interfered with the following? General activity 8 Relation with others 8 Enjoyment of life 8 What TIME of day is your pain at its worst? morning , daytime, evening, and night Sleep (in general) Fair  Pain is worse with: walking, bending, sitting, inactivity, standing, unsure, and some activites Pain improves with: rest, heat/ice, therapy/exercise, pacing activities, and medication Relief from Meds: 4  Family History  Problem Relation Age of Onset  . Hypertension Mother   . Hypertension Father    Social History   Socioeconomic History  . Marital status: Married    Spouse name: Not on file  . Number of children: Not on file  . Years of education: Not on file  . Highest education level: Not on file  Occupational History  . Not on file  Tobacco Use  . Smoking status: Former    Current packs/day: 0.00    Types: Cigarettes    Quit date: 06/27/2011    Years since quitting: 12.6  . Smokeless tobacco: Never  Vaping Use  . Vaping status: Never Used  Substance and Sexual Activity  . Alcohol  use: Not Currently  . Drug use: Not Currently  . Sexual activity: Not on file  Other Topics Concern  . Not on file  Social History Narrative  . Not on file   Social Drivers of Health   Financial Resource Strain: Patient Declined (12/15/2023)   Received from [facility]   Overall Financial Resource Strain (CARDIA)   . Difficulty of Paying Living Expenses: Patient declined  Food Insecurity: Patient Declined (12/15/2023)   Received from [facility]   Hunger Vital Sign   . Worried About Programme researcher, broadcasting/film/video in the Last Year: Patient declined   . Ran Out of Food in the Last Year: Patient declined  Transportation Needs:  Patient Declined (12/15/2023)   Received from [facility] - Transportation   . Lack of Transportation (Medical): Patient declined   . Lack of Transportation (Non-Medical): Patient declined  Physical Activity: Patient Declined (12/15/2023)   Received from [facility]   Exercise Vital Sign   . Days of Exercise per Week: Patient declined   . Minutes of Exercise per Session: Patient declined  Recent Concern: Physical Activity - Insufficiently Active (12/03/2023)   Received from [facility]   Exercise Vital Sign   . Days of Exercise per Week: 2 days   . Minutes of Exercise per Session: 10 min  Stress: Patient Declined (12/15/2023)   Received from [facility] of Occupational Health - Occupational Stress Questionnaire   . Feeling of Stress : Patient declined  Social Connections: Patient Declined (12/15/2023)   Received from [facility]   Social Network   . How would you rate your social network (family, work, friends)?: Patient declined   Past Surgical History:  Procedure Laterality Date  . HERNIA REPAIR    . SPINE SURGERY    . urinary tract surgery     Past Surgical History:  Procedure Laterality Date  . HERNIA REPAIR    . SPINE SURGERY    . urinary tract surgery     Past Medical History:  Diagnosis Date  . Anxiety   .  Chronic pain syndrome   . 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 Facet syndrome, lumbar   . Kyphoscoliosis and scoliosis   . Lumbago    BP (!) 148/89 (BP Location: Left Arm, Patient Position: Sitting, Cuff Size: Large)   Pulse 93   Ht 5\' 7"  (1.702 m)   Wt 266 lb 6.4 oz (120.8 kg)   SpO2 97%   BMI 41.72 kg/m   Opioid Risk Score:   Fall Risk Score:  `1  Depression screen PHQ 2/9     02/13/2024    2:10 PM 12/19/2023    2:39 PM 09/19/2023    2:36 PM 07/11/2023    2:55 PM 02/21/2023    2:08 PM 01/23/2023   12:36 PM 11/22/2022    2:29 PM  Depression screen PHQ 2/9  Decreased Interest 0 0 0 0 0 0 0  Down,  Depressed, Hopeless 0 0 0 0 0 0 0  PHQ - 2 Score 0 0 0 0 0 0 0      Review of Systems  Musculoskeletal:  Positive for arthralgias, back pain and myalgias.       Bilateral shoulder and knee pain, back pain  All other systems reviewed and are negative.      Objective:   Physical Exam        Assessment & Plan:

## 2024-02-16 ENCOUNTER — Other Ambulatory Visit: Payer: Self-pay | Admitting: Registered Nurse

## 2024-02-16 DIAGNOSIS — Q870 Congenital malformation syndromes predominantly affecting facial appearance: Secondary | ICD-10-CM

## 2024-02-16 NOTE — Telephone Encounter (Signed)
 PMP was Reviewed.  Ambien  e-scribed to pharmacy.

## 2024-03-18 ENCOUNTER — Telehealth: Payer: Self-pay

## 2024-03-18 DIAGNOSIS — Q870 Congenital malformation syndromes predominantly affecting facial appearance: Secondary | ICD-10-CM

## 2024-03-18 NOTE — Telephone Encounter (Signed)
 Please advise Ford Ide NP is not in the office until next week.   Mr. Tangen is requesting a refill of Zolpidem  10 MG. If granted [facility]   Thank you.

## 2024-03-19 MED ORDER — ZOLPIDEM TARTRATE 10 MG PO TABS: 10.0000 mg | ORAL_TABLET | Freq: Every evening | ORAL | 4 refills | Status: DC | PRN

## 2024-03-19 NOTE — Telephone Encounter (Signed)
 Rx written and sent to the pharmacy. Thanks!

## 2024-04-12 ENCOUNTER — Telehealth: Payer: Self-pay | Admitting: Registered Nurse

## 2024-04-12 NOTE — Telephone Encounter (Signed)
 Patient is out of town and cannot attend his follow up scheduled for 07/11. He says he is going to run out of his medication soon (MS CONTIN ) He is [place] of state until 07[place]16. He would like a call back today or whenever possible.

## 2024-04-12 NOTE — Telephone Encounter (Signed)
 PMP was Reviewed.  Kenneth Farrell he is out of town, in [place]. His appointment will be changed to 05/14/2024, for 2:40 He verbalizes understanding. After appointment is scheduled his medication will be prescribed, he verbalizes understanding.

## 2024-04-15 ENCOUNTER — Telehealth: Payer: Self-pay | Admitting: Registered Nurse

## 2024-04-15 DIAGNOSIS — M47816 Spondylosis without myelopathy or radiculopathy, lumbar region: Secondary | ICD-10-CM

## 2024-04-15 DIAGNOSIS — G894 Chronic pain syndrome: Secondary | ICD-10-CM

## 2024-04-15 DIAGNOSIS — Q870 Congenital malformation syndromes predominantly affecting facial appearance: Secondary | ICD-10-CM

## 2024-04-15 MED ORDER — MORPHINE SULFATE ER 100 MG PO TBCR: 100.0000 mg | EXTENDED_RELEASE_TABLET | Freq: Two times a day (BID) | ORAL | 0 refills | Status: DC

## 2024-04-15 NOTE — Telephone Encounter (Signed)
 PMP was Reviewed.  MS Contin  e-scribed to pharmacy.  Kenneth Farrell is aware via My-Chart

## 2024-04-16 ENCOUNTER — Encounter: Admitting: Registered Nurse

## 2024-04-19 ENCOUNTER — Other Ambulatory Visit: Payer: Self-pay | Admitting: Registered Nurse

## 2024-04-21 ENCOUNTER — Other Ambulatory Visit: Payer: Self-pay | Admitting: Registered Nurse

## 2024-04-21 DIAGNOSIS — M47816 Spondylosis without myelopathy or radiculopathy, lumbar region: Secondary | ICD-10-CM

## 2024-04-21 DIAGNOSIS — G894 Chronic pain syndrome: Secondary | ICD-10-CM

## 2024-05-12 NOTE — Progress Notes (Signed)
 Subjective:    Patient ID: Kenneth Farrell, male    DOB: 1970/12/19, 53 y.o.   MRN: [id number]  HPI: Kenneth Farrell is a 53 y.o. male who returns for follow up appointment for chronic pain and medication refill. He states his  pain is located in his neck radiating into his bilateral shoulders, upper- lower back radiating into his bilateral hips and bilateral lower extremities and bilateral knee pain. She rates her pain 9. His current exercise regime is walking and performing stretching exercises.  Mr. Monk arrived hypertensive, blood pressure was re-checked. He states he is compliant with his anti-hypertensive medication.   Mr. Bekele Morphine  equivalent is 200.00 MME. He s also prescribed Alprazolam  .We have discussed the black box warning of using opioids and benzodiazepines. I highlighted the dangers of using these drugs together and discussed the adverse events including respiratory suppression, overdose, cognitive impairment and importance of compliance with current regimen. We will continue to monitor and adjust as indicated.     Pain Inventory Average Pain 9 Pain Right Now 9 My pain is constant, sharp, burning, dull, stabbing, tingling, and aching  In the last 24 hours, has pain interfered with the following? General activity 9 Relation with others 9 Enjoyment of life 9 What TIME of day is your pain at its worst? morning , daytime, evening, and night Sleep (in general) Fair  Pain is worse with: walking, bending, sitting, inactivity, standing, and some activites Pain improves with: rest, heat/ice, therapy/exercise, pacing activities, and medication Relief from Meds: 3  Family History  Problem Relation Age of Onset  . Hypertension Mother   . Hypertension Father    Social History   Socioeconomic History  . Marital status: Married    Spouse name: Not on file  . Number of children: Not on file  . Years of education: Not on file  . Highest education level: Not on file   Occupational History  . Not on file  Tobacco Use  . Smoking status: Former    Current packs/day: 0.00    Types: Cigarettes    Quit date: 06/27/2011    Years since quitting: 12.8  . Smokeless tobacco: Never  Vaping Use  . Vaping status: Never Used  Substance and Sexual Activity  . Alcohol  use: Not Currently  . Drug use: Not Currently  . Sexual activity: Not on file  Other Topics Concern  . Not on file  Social History Narrative  . Not on file   Social Drivers of Health   Financial Resource Strain: Patient Declined (12/15/2023)   Received from [facility]   Overall Financial Resource Strain (CARDIA)   . Difficulty of Paying Living Expenses: Patient declined  Food Insecurity: Patient Declined (12/15/2023)   Received from [facility]   Hunger Vital Sign   . Within the past 12 months, you worried that your food would run out before you got the money to buy more.: Patient declined   . Within the past 12 months, the food you bought just didn't last and you didn't have money to get more.: Patient declined  Transportation Needs: Patient Declined (12/15/2023)   Received from [facility] - Transportation   . Lack of Transportation (Medical): Patient declined   . Lack of Transportation (Non-Medical): Patient declined  Physical Activity: Patient Declined (12/15/2023)   Received from [facility]   Exercise Vital Sign   . On average, how many days per week do you engage in moderate to strenuous exercise (  like a brisk walk)?: Patient declined   . On average, how many minutes do you engage in exercise at this level?: Patient declined  Recent Concern: Physical Activity - Insufficiently Active (12/03/2023)   Received from [facility]   Exercise Vital Sign   . Days of Exercise per Week: 2 days   . Minutes of Exercise per Session: 10 min  Stress: Patient Declined (12/15/2023)   Received from [facility] of Occupational Health - Occupational Stress  Questionnaire   . Feeling of Stress : Patient declined  Social Connections: Patient Declined (12/15/2023)   Received from [facility]   Social Network   . How would you rate your social network (family, work, friends)?: Patient declined   Past Surgical History:  Procedure Laterality Date  . HERNIA REPAIR    . SPINE SURGERY    . urinary tract surgery     Past Surgical History:  Procedure Laterality Date  . HERNIA REPAIR    . SPINE SURGERY    . urinary tract surgery     Past Medical History:  Diagnosis Date  . Anxiety   . Chronic pain syndrome   . 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 Facet syndrome, lumbar   . Kyphoscoliosis and scoliosis   . Lumbago    There were no vitals taken for this visit.  Opioid Risk Score:   Fall Risk Score:  `1  Depression screen PHQ 2/9     02/13/2024    2:10 PM 12/19/2023    2:39 PM 09/19/2023    2:36 PM 07/11/2023    2:55 PM 02/21/2023    2:08 PM 01/23/2023   12:36 PM 11/22/2022    2:29 PM  Depression screen PHQ 2/9  Decreased Interest 0 0 0 0 0 0 0  Down, Depressed, Hopeless 0 0 0 0 0 0 0  PHQ - 2 Score 0 0 0 0 0 0 0    Review of Systems  Musculoskeletal:  Positive for back pain and neck pain.       L & R shoulder pain, all over back pain, bilateral leg pain, bilateral knee pain        Objective:   Physical Exam Vitals and nursing note reviewed.  Constitutional:      Appearance: Normal appearance.  Neck:     Comments: Cervical Paraspinal Tenderness: C-5-C-6  Cardiovascular:     Rate and Rhythm: Normal rate and regular rhythm.     Pulses: Normal pulses.     Heart sounds: Normal heart sounds.  Pulmonary:     Effort: Pulmonary effort is normal.     Breath sounds: Normal breath sounds.  Musculoskeletal:     Comments: Normal Muscle Bulk and Muscle Testing Reveals:  Upper Extremities: Full ROM and Muscle Strength 5/5 Bilateral AC Joint Tenderness Thoracic, and Lumbar Hypersensitivity Bilateral Greater Trochanter  Tenderness Lower Extremities: Full ROM and Muscle Strength 5/5 Arises from Table Slowly Narrow Based  Gait     Skin:    General: Skin is warm and dry.  Neurological:     Mental Status: He is alert and oriented to person, place, and time.  Psychiatric:        Mood and Affect: Mood normal.        Behavior: Behavior normal.          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 05/14/2024 2. Lumbar Radiculitis/ Chronic Back Pain/ Lumbar Spondylosis:  MS Contin  is effective for pain management and makes a great difference in his quality of Life. Continue using Pamelor  for neuropathy. Also recommended alternating heat and ice for shoulder pain.  05/14/2024. Refilled: MS Contin  100mg  #60,---use one tablet every 12 hours. Second script sent for the following month. 05/14/2024 We will continue the opioid monitoring program, this consists of regular clinic visits, examinations, urine drug screen, pill counts as well as use of [place]  Controlled Substance Reporting system. A 12 month History has been reviewed on the   Controlled Substance Reporting System on 05/14/2024. 3. Anxiety: Continue Xanax . Educated on [employer]. Continue to Monitor.[id number] 4. Insomnia: Continue Ambien . Continue to Monitor. 05/14/2024. 5. Muscle Spasm: Continue Flexeril . Continue to monitor. 05/14/2024 6. Cervicalgia: Cervical Radiculitis Continue HEP as tolerated. Continue to monitor. 05/14/2024 7. Bilateral Greater Trochanter Bursitis: Continue to Alternate Ice and Heat Therapy. Continue current medication. Continue to Monitor. 05/14/2024     F/U in 2 month

## 2024-05-12 NOTE — Progress Notes (Deleted)
   Subjective:    Patient ID: Kenneth Farrell, male    DOB: 06-May-1971, 53 y.o.   MRN: [id number]  HPI   .cpr Review of Systems     Objective:   Physical Exam        Assessment & Plan:

## 2024-05-14 ENCOUNTER — Encounter: Attending: Registered Nurse | Admitting: Registered Nurse

## 2024-05-14 ENCOUNTER — Encounter: Payer: Self-pay | Admitting: Registered Nurse

## 2024-05-14 VITALS — BP 167/98 | HR 91 | Ht 67.0 in | Wt 275.0 lb

## 2024-05-14 DIAGNOSIS — M542 Cervicalgia: Secondary | ICD-10-CM | POA: Insufficient documentation

## 2024-05-14 DIAGNOSIS — M546 Pain in thoracic spine: Secondary | ICD-10-CM | POA: Insufficient documentation

## 2024-05-14 DIAGNOSIS — M47816 Spondylosis without myelopathy or radiculopathy, lumbar region: Secondary | ICD-10-CM | POA: Diagnosis present

## 2024-05-14 DIAGNOSIS — M5416 Radiculopathy, lumbar region: Secondary | ICD-10-CM | POA: Insufficient documentation

## 2024-05-14 DIAGNOSIS — G8929 Other chronic pain: Secondary | ICD-10-CM | POA: Diagnosis present

## 2024-05-14 DIAGNOSIS — Q870 Congenital malformation syndromes predominantly affecting facial appearance: Secondary | ICD-10-CM | POA: Insufficient documentation

## 2024-05-14 DIAGNOSIS — M5412 Radiculopathy, cervical region: Secondary | ICD-10-CM | POA: Diagnosis present

## 2024-05-14 DIAGNOSIS — Z5181 Encounter for therapeutic drug level monitoring: Secondary | ICD-10-CM | POA: Diagnosis present

## 2024-05-14 DIAGNOSIS — Z79891 Long term (current) use of opiate analgesic: Secondary | ICD-10-CM | POA: Insufficient documentation

## 2024-05-14 DIAGNOSIS — M7061 Trochanteric bursitis, right hip: Secondary | ICD-10-CM | POA: Insufficient documentation

## 2024-05-14 DIAGNOSIS — G894 Chronic pain syndrome: Secondary | ICD-10-CM | POA: Insufficient documentation

## 2024-05-14 MED ORDER — MORPHINE SULFATE ER 100 MG PO TBCR: 100.0000 mg | EXTENDED_RELEASE_TABLET | Freq: Two times a day (BID) | ORAL | 0 refills | Status: DC

## 2024-07-12 ENCOUNTER — Telehealth: Payer: Self-pay | Admitting: Registered Nurse

## 2024-07-12 NOTE — Telephone Encounter (Signed)
 P said he believed he just missed your call

## 2024-07-12 NOTE — Telephone Encounter (Signed)
 P called and has covid and wants to know if his appt with you on 10/10 can be rescheduled or a video visit. He stated he would be more comfortable speaking to you about this. Can you give him a call when you get a chance?

## 2024-07-13 ENCOUNTER — Telehealth: Payer: Self-pay | Admitting: Registered Nurse

## 2024-07-13 DIAGNOSIS — M47816 Spondylosis without myelopathy or radiculopathy, lumbar region: Secondary | ICD-10-CM

## 2024-07-13 DIAGNOSIS — Q870 Congenital malformation syndromes predominantly affecting facial appearance: Secondary | ICD-10-CM

## 2024-07-13 DIAGNOSIS — G894 Chronic pain syndrome: Secondary | ICD-10-CM

## 2024-07-13 MED ORDER — MORPHINE SULFATE ER 100 MG PO TBCR: 100.0000 mg | EXTENDED_RELEASE_TABLET | Freq: Two times a day (BID) | ORAL | 0 refills | Status: DC

## 2024-07-13 NOTE — Telephone Encounter (Signed)
 Return Mr. Canner call,  + COVID, his appointment will be changed to 07/27/2024 Morphine  e-scribed to pharmacy.  He verbalizes understanding.

## 2024-07-16 ENCOUNTER — Encounter: Admitting: Registered Nurse

## 2024-07-27 ENCOUNTER — Encounter: Payer: Self-pay | Admitting: Registered Nurse

## 2024-07-27 ENCOUNTER — Encounter: Attending: Registered Nurse | Admitting: Registered Nurse

## 2024-07-27 VITALS — BP 142/73 | Ht 67.0 in | Wt 269.0 lb

## 2024-07-27 DIAGNOSIS — M47816 Spondylosis without myelopathy or radiculopathy, lumbar region: Secondary | ICD-10-CM | POA: Insufficient documentation

## 2024-07-27 DIAGNOSIS — M542 Cervicalgia: Secondary | ICD-10-CM | POA: Diagnosis not present

## 2024-07-27 DIAGNOSIS — M546 Pain in thoracic spine: Secondary | ICD-10-CM | POA: Insufficient documentation

## 2024-07-27 DIAGNOSIS — Q870 Congenital malformation syndromes predominantly affecting facial appearance: Secondary | ICD-10-CM | POA: Insufficient documentation

## 2024-07-27 DIAGNOSIS — F411 Generalized anxiety disorder: Secondary | ICD-10-CM | POA: Insufficient documentation

## 2024-07-27 DIAGNOSIS — Z79891 Long term (current) use of opiate analgesic: Secondary | ICD-10-CM | POA: Insufficient documentation

## 2024-07-27 DIAGNOSIS — M5416 Radiculopathy, lumbar region: Secondary | ICD-10-CM | POA: Insufficient documentation

## 2024-07-27 DIAGNOSIS — M5412 Radiculopathy, cervical region: Secondary | ICD-10-CM | POA: Insufficient documentation

## 2024-07-27 DIAGNOSIS — G8929 Other chronic pain: Secondary | ICD-10-CM | POA: Insufficient documentation

## 2024-07-27 DIAGNOSIS — M7061 Trochanteric bursitis, right hip: Secondary | ICD-10-CM | POA: Insufficient documentation

## 2024-07-27 DIAGNOSIS — M25562 Pain in left knee: Secondary | ICD-10-CM | POA: Insufficient documentation

## 2024-07-27 DIAGNOSIS — M25561 Pain in right knee: Secondary | ICD-10-CM | POA: Insufficient documentation

## 2024-07-27 DIAGNOSIS — F5101 Primary insomnia: Secondary | ICD-10-CM | POA: Insufficient documentation

## 2024-07-27 DIAGNOSIS — G894 Chronic pain syndrome: Secondary | ICD-10-CM | POA: Insufficient documentation

## 2024-07-27 DIAGNOSIS — Z5181 Encounter for therapeutic drug level monitoring: Secondary | ICD-10-CM | POA: Insufficient documentation

## 2024-07-27 MED ORDER — ALPRAZOLAM 1 MG PO TABS: ORAL_TABLET | ORAL | 3 refills | Status: DC

## 2024-07-27 MED ORDER — MORPHINE SULFATE ER 100 MG PO TBCR: 100.0000 mg | EXTENDED_RELEASE_TABLET | Freq: Two times a day (BID) | ORAL | 0 refills | Status: DC

## 2024-07-27 MED ORDER — ZOLPIDEM TARTRATE 10 MG PO TABS: 10.0000 mg | ORAL_TABLET | Freq: Every evening | ORAL | 4 refills | Status: AC | PRN

## 2024-07-27 NOTE — Progress Notes (Signed)
 Subjective:    Patient ID: Kenneth Farrell, male    DOB: 10-Aug-1971, 53 y.o.   MRN: [id number]  HPI: Kenneth Farrell is a 53 y.o. male whose appointment was changed to virtual visit due + COVID,  I connected with [name] and verified that I am speaking with the correct person using two identifiers.  Location: Patient: In his Home Provider: In the office   I discussed the limitations, risks, security and privacy concerns of performing an evaluation and management service by telephone and the availability of in person appointments. I also discussed with the patient that there may be a patient responsible charge related to this service. The patient expressed understanding and agreed to proceed.  He states his pain is located in his neck radiating into his bilateral shoulders, mid- lower back radiating into his bilateral hips and bilateral lower extremities. He also reports bilateral knee pain. He rates his pain 8. His current exercise regime is walking and performing stretching exercises.  Mr. Longsworth Morphine  equivalent is 200.00 MME.       Pain Inventory Average Pain 8 Pain Right Now 8 My pain is intermittent, constant, sharp, dull, stabbing, tingling, and aching  In the last 24 hours, has pain interfered with the following? General activity 7 Relation with others 7 Enjoyment of life 7 What TIME of day is your pain at its worst? morning , daytime, evening, and night Sleep (in general) Fair  Pain is worse with: walking, bending, sitting, standing, and some activites Pain improves with: rest and medication Relief from Meds: 3  Family History  Problem Relation Age of Onset   Hypertension Mother    Hypertension Father    Social History   Socioeconomic History   Marital status: Married    Spouse name: Not on file   Number of children: Not on file   Years of education: Not on file   Highest education level: Not on file  Occupational History   Not on file  Tobacco Use    Smoking status: Former    Current packs/day: 0.00    Types: Cigarettes    Quit date: 06/27/2011    Years since quitting: 13.0   Smokeless tobacco: Never  Vaping Use   Vaping status: Never Used  Substance and Sexual Activity   Alcohol  use: Not Currently   Drug use: Not Currently   Sexual activity: Not on file  Other Topics Concern   Not on file  Social History Narrative   Not on file   Social Drivers of Health   Financial Resource Strain: Patient Declined (12/15/2023)   Received from [facility]   Overall Financial Resource Strain (CARDIA)    Difficulty of Paying Living Expenses: Patient declined  Food Insecurity: Patient Declined (12/15/2023)   Received from [facility]   Hunger Vital Sign    Within the past 12 months, you worried that your food would run out before you got the money to buy more.: Patient declined    Within the past 12 months, the food you bought just didn't last and you didn't have money to get more.: Patient declined  Transportation Needs: Patient Declined (12/15/2023)   Received from [facility] - Transportation    Lack of Transportation (Medical): Patient declined    Lack of Transportation (Non-Medical): Patient declined  Physical Activity: Patient Declined (12/15/2023)   Received from [facility]   Exercise Vital Sign    On average, how many days per week  do you engage in moderate to strenuous exercise (like a brisk walk)?: Patient declined    On average, how many minutes do you engage in exercise at this level?: Patient declined  Recent Concern: Physical Activity - Insufficiently Active (12/03/2023)   Received from [facility]   Exercise Vital Sign    Days of Exercise per Week: 2 days    Minutes of Exercise per Session: 10 min  Stress: Patient Declined (12/15/2023)   Received from [facility] - Occupational Stress Questionnaire    Feeling of Stress : Patient declined  Social Connections:  Patient Declined (12/15/2023)   Received from 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       Bilateral shoulder pain, hip pain, knee pain and neck pain  All other systems reviewed and are negative.      Objective:   Physical Exam Vitals and nursing note reviewed.  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 07/27/2024 2. Lumbar Radiculitis/ Chronic Back Pain/ Lumbar Spondylosis: MS Contin  is effective for pain management and makes a great difference in his quality of Life. Continue using Pamelor  for neuropathy. Also recommended alternating heat and ice for shoulder pain.  07/27/2024. Refilled: MS Contin  100mg  #60,---use one tablet every 12 hours. Second script sent for the following month. 07/27/2024 We will continue the opioid  monitoring program, this consists of regular clinic visits, examinations, urine drug screen, pill counts as well as use of [place]  Controlled Substance Reporting system. A 12 month History has been reviewed on the [place]  Controlled Substance Reporting System on 07/27/2024. 3. Anxiety: Continue Xanax . Educated on [employer]. Continue to Monitor 10/212025 4. Insomnia: Continue Ambien . Continue to Monitor. 07/27/2024. 5. Muscle Spasm: Continue Flexeril . Continue to monitor. 07/27/2024 6. Cervicalgia: Cervical Radiculitis Continue HEP as tolerated. Continue to monitor.  07/27/2024 7. Bilateral Greater Trochanter Bursitis: Continue to Alternate Ice and Heat Therapy. Continue current medication. Continue to Monitor. 05/14/2024     F/U in 2 month  Virtual Visit Established Patient Location of Patient: In his home Location of Provider: In the Office

## 2024-09-10 ENCOUNTER — Telehealth: Payer: Self-pay | Admitting: Registered Nurse

## 2024-09-10 NOTE — Telephone Encounter (Signed)
 Pt need meds refill on mscontin

## 2024-09-13 ENCOUNTER — Other Ambulatory Visit: Payer: Self-pay

## 2024-09-13 DIAGNOSIS — G8929 Other chronic pain: Secondary | ICD-10-CM

## 2024-09-13 DIAGNOSIS — G894 Chronic pain syndrome: Secondary | ICD-10-CM

## 2024-09-13 DIAGNOSIS — M542 Cervicalgia: Secondary | ICD-10-CM

## 2024-09-13 DIAGNOSIS — M47816 Spondylosis without myelopathy or radiculopathy, lumbar region: Secondary | ICD-10-CM

## 2024-09-13 DIAGNOSIS — Q870 Congenital malformation syndromes predominantly affecting facial appearance: Secondary | ICD-10-CM

## 2024-09-13 NOTE — Telephone Encounter (Signed)
 Sending patient a MyChart that prescription is at the pharmacy

## 2024-09-21 ENCOUNTER — Encounter: Admitting: Registered Nurse

## 2024-10-14 ENCOUNTER — Other Ambulatory Visit: Payer: Self-pay | Admitting: Registered Nurse

## 2024-10-15 ENCOUNTER — Encounter: Attending: Registered Nurse | Admitting: Registered Nurse

## 2024-10-15 ENCOUNTER — Encounter: Payer: Self-pay | Admitting: Registered Nurse

## 2024-10-15 VITALS — BP 159/92 | HR 96 | Ht 67.0 in | Wt 273.0 lb

## 2024-10-15 DIAGNOSIS — M47816 Spondylosis without myelopathy or radiculopathy, lumbar region: Secondary | ICD-10-CM | POA: Diagnosis not present

## 2024-10-15 DIAGNOSIS — M25562 Pain in left knee: Secondary | ICD-10-CM | POA: Diagnosis present

## 2024-10-15 DIAGNOSIS — Q870 Congenital malformation syndromes predominantly affecting facial appearance: Secondary | ICD-10-CM | POA: Insufficient documentation

## 2024-10-15 DIAGNOSIS — M7062 Trochanteric bursitis, left hip: Secondary | ICD-10-CM | POA: Insufficient documentation

## 2024-10-15 DIAGNOSIS — G8929 Other chronic pain: Secondary | ICD-10-CM | POA: Diagnosis present

## 2024-10-15 DIAGNOSIS — M5412 Radiculopathy, cervical region: Secondary | ICD-10-CM | POA: Diagnosis not present

## 2024-10-15 DIAGNOSIS — M542 Cervicalgia: Secondary | ICD-10-CM | POA: Insufficient documentation

## 2024-10-15 DIAGNOSIS — M7061 Trochanteric bursitis, right hip: Secondary | ICD-10-CM | POA: Diagnosis not present

## 2024-10-15 DIAGNOSIS — Z79891 Long term (current) use of opiate analgesic: Secondary | ICD-10-CM | POA: Insufficient documentation

## 2024-10-15 DIAGNOSIS — G894 Chronic pain syndrome: Secondary | ICD-10-CM | POA: Insufficient documentation

## 2024-10-15 DIAGNOSIS — F411 Generalized anxiety disorder: Secondary | ICD-10-CM | POA: Diagnosis not present

## 2024-10-15 DIAGNOSIS — M25561 Pain in right knee: Secondary | ICD-10-CM | POA: Diagnosis not present

## 2024-10-15 DIAGNOSIS — M546 Pain in thoracic spine: Secondary | ICD-10-CM | POA: Diagnosis not present

## 2024-10-15 DIAGNOSIS — Z5181 Encounter for therapeutic drug level monitoring: Secondary | ICD-10-CM | POA: Diagnosis not present

## 2024-10-15 DIAGNOSIS — I10 Essential (primary) hypertension: Secondary | ICD-10-CM | POA: Insufficient documentation

## 2024-10-15 MED ORDER — MORPHINE SULFATE ER 100 MG PO TBCR: 100.0000 mg | EXTENDED_RELEASE_TABLET | Freq: Two times a day (BID) | ORAL | 0 refills | Status: AC

## 2024-10-15 MED ORDER — MORPHINE SULFATE ER 100 MG PO TBCR: 100.0000 mg | EXTENDED_RELEASE_TABLET | Freq: Two times a day (BID) | ORAL | 0 refills | Status: DC

## 2024-10-15 MED ORDER — ALPRAZOLAM 1 MG PO TABS: ORAL_TABLET | ORAL | 3 refills | Status: AC

## 2024-10-15 MED ORDER — NORTRIPTYLINE HCL 50 MG PO CAPS: 100.0000 mg | ORAL_CAPSULE | Freq: Every day | ORAL | 1 refills | Status: AC

## 2024-10-15 NOTE — Progress Notes (Signed)
 "  Subjective:    Patient ID: Kenneth Farrell, male    DOB: May 14, 1971, 54 y.o.   MRN: [id number]  HPI: Kenneth Farrell is a 54 y.o. male who returns for follow up appointment for chronic pain and medication refill. He states his pain is located in his neck radiating into his bilateral shoulder, mid- back mainly left side, bilateral hips and bilateral knee pain . He rates his pain 8. His current exercise regime is walking and performing stretching exercises.  Kenneth Farrell Morphine  equivalent is 200.00 MME.  He is also prescribed Alprazolam  .We have discussed the black box warning of using opioids and benzodiazepines. I highlighted the dangers of using these drugs together and discussed the adverse events including respiratory suppression, overdose, cognitive impairment and importance of compliance with current regimen. We will continue to monitor and adjust as indicated.    Oral Swab was Performed today.    Pain Inventory Average Pain 9 Pain Right Now 8 My pain is intermittent, constant, sharp, burning, dull, stabbing, tingling, and aching  In the last 24 hours, has pain interfered with the following? General activity 8 Relation with others 8 Enjoyment of life 9 What TIME of day is your pain at its worst? morning , daytime, evening, and night Sleep (in general) Fair  Pain is worse with: walking, bending, sitting, inactivity, standing, and some activites Pain improves with: rest, heat/ice, therapy/exercise, pacing activities, and medication Relief from Meds: 5  Family History  Problem Relation Age of Onset   Hypertension Mother    Hypertension Father    Social History   Socioeconomic History   Marital status: Married    Spouse name: Not on file   Number of children: Not on file   Years of education: Not on file   Highest education level: Not on file  Occupational History   Not on file  Tobacco Use   Smoking status: Former    Current packs/day: 0.00    Types: Cigarettes    Quit  date: 06/27/2011    Years since quitting: 13.3   Smokeless tobacco: Never  Vaping Use   Vaping status: Never Used  Substance and Sexual Activity   Alcohol  use: Not Currently   Drug use: Not Currently   Sexual activity: Not on file  Other Topics Concern   Not on file  Social History Narrative   Not on file   Social Drivers of Health   Tobacco Use: Medium Risk (08/25/2024)   Received from [facility]   Patient History    Passive Exposure: Past    Smokeless Tobacco Use: Former    Smoking Tobacco Use: Former  Programmer, Applications: Patient Declined (12/15/2023)   Received from [employer]   Overall Financial Resource Strain (CARDIA)    Difficulty of Paying Living Expenses: Patient declined  Food Insecurity: Patient Declined (12/15/2023)   Received from [facility]   Epic    Within the past 12 months, you worried that your food would run out before you got the money to buy more.: Patient declined    Within the past 12 months, the food you bought just didn't last and you didn't have money to get more.: Patient declined  Transportation Needs: Patient Declined (12/15/2023)   Received from [facility] - Transportation    Lack of Transportation (Medical): Patient declined    Lack of Transportation (Non-Medical): Patient declined  Physical Activity: Patient Declined (12/15/2023)   Received from [facility]   Exercise  Vital Sign    On average, how many days per week do you engage in moderate to strenuous exercise (like a brisk walk)?: Patient declined    On average, how many minutes do you engage in exercise at this level?: Patient declined  Recent Concern: Physical Activity - Insufficiently Active (12/03/2023)   Received from [facility]   Exercise Vital Sign    Days of Exercise per Week: 2 days    Minutes of Exercise per Session: 10 min  Stress: Patient Declined (12/15/2023)   Received from [facility] -  Occupational Stress Questionnaire    Feeling of Stress : Patient declined  Social Connections: Patient Declined (12/15/2023)   Received from [facility]   Social Network    How would you rate your social network (family, work, friends)?: Patient declined  Depression (PHQ2-9): Low Risk (02/13/2024)   Depression (PHQ2-9)    PHQ-2 Score: 0  Alcohol  Screen: Not on file  Housing: Patient Declined (12/15/2023)   Received from [facility]    In the last 12 months, was there a time when you were not able to pay the mortgage or rent on time?: Patient declined    In the past 12 months, how many times have you moved where you were living?: 0    At any time in the past 12 months, were you homeless or living in a shelter (including now)?: Patient declined  Utilities: Patient Declined (12/15/2023)   Received from [facility]    Threatened with loss of utilities: Patient declined  Health Literacy: Not on file   Past Surgical History:  Procedure Laterality Date   HERNIA REPAIR     SPINE SURGERY     urinary tract surgery     Past Surgical History:  Procedure Laterality Date   HERNIA REPAIR     SPINE SURGERY     urinary tract surgery     Past Medical History:  Diagnosis Date   Anxiety    Chronic pain syndrome    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        B/L shoulder and leg pain  All other systems reviewed and are negative.      Objective:   Physical Exam Vitals and nursing note reviewed.  Constitutional:      Appearance: Normal appearance. He is obese.  Cardiovascular:     Rate and Rhythm: Normal rate and regular rhythm.     Pulses: Normal pulses.  Heart sounds: Normal heart sounds.  Pulmonary:     Effort: Pulmonary effort is normal.     Breath sounds: Normal breath sounds.     Comments: Continuous Oxygen 3 liters nasal cannula Musculoskeletal:     Comments: Normal Muscle Bulk and Muscle Testing Reveals:  Upper Extremities: Full ROM and Muscle Strength 5/5  Thoracic Paraspinal Tenderness: T-4-T-6 Mainly Left Side   Lumbar Paraspinal Tenderness: L-3-L-5 Lower Extremities: Full ROM and Muscle Strength 5/5 Arises from Table with ease Narrow Based  Gait     Skin:    General: Skin is warm and dry.  Neurological:     Mental Status: He is alert and oriented to person, place, and time.          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 10/15/2024 2. Lumbar Radiculitis/ Chronic Back Pain/ Lumbar Spondylosis: MS Contin  is effective for pain management and makes a great difference in his quality of Life. Continue using Pamelor  for neuropathy. Also recommended alternating heat and ice for shoulder pain.  10/15/2024. Refilled: MS Contin  100mg  #60,---use one tablet every 12 hours. Second script sent for the following month. 10/15/2024 We will continue the opioid monitoring program, this consists of regular clinic visits, examinations, urine drug screen, pill counts as well as use of [place]  Controlled Substance Reporting system. A 12 month History has been reviewed on the [place]  Controlled Substance Reporting System on 10/15/2024. 3. Anxiety: Continue Xanax . Educated on [employer]. Continue to Monitor 01/092026 4. Insomnia: Continue Ambien . Continue to Monitor.  10/15/2024. 5. Muscle Spasm: Continue Flexeril . Continue to monitor. 10/15/2024 6. Cervicalgia: Cervical Radiculitis Continue HEP as tolerated. Continue to monitor. 10/15/2024 7. Bilateral Greater Trochanter Bursitis: Continue to Alternate Ice and Heat Therapy. Continue current medication. Continue to Monitor. 10/15/2024 8. Essential Hypertension: Mr. Yohannes reports he is compliant with his anti-hypertensive medication. Blood Pressure was re-checked, he will F/u with his PCP. He verbalizes understanding.    F/U in 2 month  "

## 2024-10-19 LAB — DRUG TOX MONITOR 1 W/CONF, ORAL FLD
AMINOCLONAZEPAM: NEGATIVE ng/mL
Alprazolam: 2.72 ng/mL — ABNORMAL HIGH
Amphetamines: NEGATIVE ng/mL
Barbiturates: NEGATIVE ng/mL
Benzodiazepines: POSITIVE ng/mL — AB
Buprenorphine: NEGATIVE ng/mL
Chlordiazepoxide: NEGATIVE ng/mL
Clonazepam: NEGATIVE ng/mL
Cocaine: NEGATIVE ng/mL
Codeine: NEGATIVE ng/mL
Diazepam: NEGATIVE ng/mL
Dihydrocodeine: NEGATIVE ng/mL
Fentanyl: NEGATIVE ng/mL
Flunitrazepam: NEGATIVE ng/mL
Flurazepam: NEGATIVE ng/mL
Heroin Metabolite: NEGATIVE ng/mL
Hydrocodone: NEGATIVE ng/mL
Hydromorphone: NEGATIVE ng/mL
Lorazepam: NEGATIVE ng/mL
MARIJUANA: NEGATIVE ng/mL
MDMA: NEGATIVE ng/mL
Meprobamate: NEGATIVE ng/mL
Methadone: NEGATIVE ng/mL
Midazolam: NEGATIVE ng/mL
Morphine: 21.8 ng/mL — ABNORMAL HIGH
Nicotine Metabolite: NEGATIVE ng/mL
Nordiazepam: NEGATIVE ng/mL
Norhydrocodone: NEGATIVE ng/mL
Noroxycodone: NEGATIVE ng/mL
Opiates: POSITIVE ng/mL — AB
Oxazepam: NEGATIVE ng/mL
Oxycodone: NEGATIVE ng/mL
Oxymorphone: NEGATIVE ng/mL
Phencyclidine: NEGATIVE ng/mL
Tapentadol: NEGATIVE ng/mL
Temazepam: NEGATIVE ng/mL
Tramadol: NEGATIVE ng/mL
Triazolam: NEGATIVE ng/mL
Zolpidem: NEGATIVE ng/mL

## 2024-10-19 LAB — DRUG TOX ALC METAB W/CON, ORAL FLD: Alcohol Metabolite: NEGATIVE ng/mL

## 2024-12-10 ENCOUNTER — Encounter: Admitting: Registered Nurse
